# Patient Record
Sex: Female | Born: 1955 | Race: White | Hispanic: No | Smoking: Former smoker
Health system: Southern US, Community
[De-identification: ages and names within clinical notes are randomized; demographics above are authoritative.]

## PROBLEM LIST (undated history)

## (undated) DIAGNOSIS — I219 Acute myocardial infarction, unspecified: Secondary | ICD-10-CM

## (undated) DIAGNOSIS — J449 Chronic obstructive pulmonary disease, unspecified: Secondary | ICD-10-CM

## (undated) DIAGNOSIS — K219 Gastro-esophageal reflux disease without esophagitis: Secondary | ICD-10-CM

## (undated) DIAGNOSIS — I2510 Atherosclerotic heart disease of native coronary artery without angina pectoris: Secondary | ICD-10-CM

## (undated) DIAGNOSIS — R51 Headache: Secondary | ICD-10-CM

## (undated) DIAGNOSIS — I10 Essential (primary) hypertension: Secondary | ICD-10-CM

## (undated) DIAGNOSIS — J189 Pneumonia, unspecified organism: Secondary | ICD-10-CM

## (undated) DIAGNOSIS — E785 Hyperlipidemia, unspecified: Secondary | ICD-10-CM

## (undated) DIAGNOSIS — K759 Inflammatory liver disease, unspecified: Secondary | ICD-10-CM

## (undated) DIAGNOSIS — R519 Headache, unspecified: Secondary | ICD-10-CM

## (undated) HISTORY — DX: Atherosclerotic heart disease of native coronary artery without angina pectoris: I25.10

## (undated) HISTORY — PX: AUGMENTATION MAMMAPLASTY: SUR837

## (undated) HISTORY — DX: Acute myocardial infarction, unspecified: I21.9

## (undated) HISTORY — PX: BREAST BIOPSY: SHX20

## (undated) HISTORY — DX: Essential (primary) hypertension: I10

## (undated) HISTORY — DX: Hyperlipidemia, unspecified: E78.5

---
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
## 1997-09-13 ENCOUNTER — Encounter: Admission: RE | Admit: 1997-09-13 | Discharge: 1997-09-13 | Payer: Self-pay | Admitting: Family Medicine

## 1997-09-20 ENCOUNTER — Encounter: Admission: RE | Admit: 1997-09-20 | Discharge: 1997-09-20 | Payer: Self-pay | Admitting: Family Medicine

## 1998-03-27 ENCOUNTER — Encounter: Admission: RE | Admit: 1998-03-27 | Discharge: 1998-03-27 | Payer: Self-pay | Admitting: Family Medicine

## 1998-03-29 ENCOUNTER — Encounter: Admission: RE | Admit: 1998-03-29 | Discharge: 1998-03-29 | Payer: Self-pay | Admitting: Family Medicine

## 1998-06-11 ENCOUNTER — Encounter: Admission: RE | Admit: 1998-06-11 | Discharge: 1998-06-11 | Payer: Self-pay | Admitting: Family Medicine

## 1998-07-04 ENCOUNTER — Encounter: Admission: RE | Admit: 1998-07-04 | Discharge: 1998-07-04 | Payer: Self-pay | Admitting: Family Medicine

## 1998-09-26 ENCOUNTER — Encounter: Admission: RE | Admit: 1998-09-26 | Discharge: 1998-09-26 | Payer: Self-pay | Admitting: Family Medicine

## 1998-10-03 ENCOUNTER — Encounter: Admission: RE | Admit: 1998-10-03 | Discharge: 1998-10-03 | Payer: Self-pay | Admitting: Family Medicine

## 1998-10-15 ENCOUNTER — Encounter: Admission: RE | Admit: 1998-10-15 | Discharge: 1998-10-15 | Payer: Self-pay | Admitting: Family Medicine

## 1998-11-21 ENCOUNTER — Encounter: Admission: RE | Admit: 1998-11-21 | Discharge: 1998-11-21 | Payer: Self-pay | Admitting: Family Medicine

## 1998-12-27 ENCOUNTER — Encounter: Admission: RE | Admit: 1998-12-27 | Discharge: 1998-12-27 | Payer: Self-pay | Admitting: Family Medicine

## 1999-01-02 ENCOUNTER — Encounter: Admission: RE | Admit: 1999-01-02 | Discharge: 1999-01-02 | Payer: Self-pay | Admitting: Family Medicine

## 1999-10-10 ENCOUNTER — Encounter: Payer: Self-pay | Admitting: Obstetrics and Gynecology

## 1999-10-10 ENCOUNTER — Encounter: Admission: RE | Admit: 1999-10-10 | Discharge: 1999-10-10 | Payer: Self-pay | Admitting: Obstetrics and Gynecology

## 1999-12-23 ENCOUNTER — Encounter: Admission: RE | Admit: 1999-12-23 | Discharge: 1999-12-23 | Payer: Self-pay | Admitting: Family Medicine

## 2001-03-10 ENCOUNTER — Encounter: Admission: RE | Admit: 2001-03-10 | Discharge: 2001-03-10 | Payer: Self-pay | Admitting: Family Medicine

## 2001-06-21 ENCOUNTER — Encounter: Admission: RE | Admit: 2001-06-21 | Discharge: 2001-06-21 | Payer: Self-pay | Admitting: Sports Medicine

## 2001-11-01 ENCOUNTER — Encounter: Admission: RE | Admit: 2001-11-01 | Discharge: 2001-11-01 | Payer: Self-pay | Admitting: Family Medicine

## 2002-02-27 ENCOUNTER — Encounter: Admission: RE | Admit: 2002-02-27 | Discharge: 2002-02-27 | Payer: Self-pay | Admitting: Obstetrics and Gynecology

## 2002-02-27 ENCOUNTER — Encounter: Payer: Self-pay | Admitting: Obstetrics and Gynecology

## 2002-05-11 DIAGNOSIS — I219 Acute myocardial infarction, unspecified: Secondary | ICD-10-CM

## 2002-05-11 HISTORY — DX: Acute myocardial infarction, unspecified: I21.9

## 2002-05-11 HISTORY — PX: CORONARY ANGIOPLASTY WITH STENT PLACEMENT: SHX49

## 2002-07-10 ENCOUNTER — Encounter: Admission: RE | Admit: 2002-07-10 | Discharge: 2002-07-10 | Payer: Self-pay | Admitting: Family Medicine

## 2002-07-10 ENCOUNTER — Ambulatory Visit (HOSPITAL_COMMUNITY): Admission: RE | Admit: 2002-07-10 | Discharge: 2002-07-10 | Payer: Self-pay | Admitting: Family Medicine

## 2002-07-11 ENCOUNTER — Encounter: Admission: RE | Admit: 2002-07-11 | Discharge: 2002-07-11 | Payer: Self-pay | Admitting: Family Medicine

## 2002-07-18 ENCOUNTER — Encounter: Admission: RE | Admit: 2002-07-18 | Discharge: 2002-07-18 | Payer: Self-pay | Admitting: *Deleted

## 2003-01-02 ENCOUNTER — Ambulatory Visit (HOSPITAL_COMMUNITY): Admission: RE | Admit: 2003-01-02 | Discharge: 2003-01-02 | Payer: Self-pay

## 2003-03-09 ENCOUNTER — Encounter: Admission: RE | Admit: 2003-03-09 | Discharge: 2003-03-09 | Payer: Self-pay | Admitting: Obstetrics and Gynecology

## 2004-07-03 ENCOUNTER — Ambulatory Visit: Payer: Self-pay | Admitting: Family Medicine

## 2004-07-06 ENCOUNTER — Encounter: Admission: RE | Admit: 2004-07-06 | Discharge: 2004-07-06 | Payer: Self-pay | Admitting: Family Medicine

## 2004-07-09 ENCOUNTER — Ambulatory Visit (HOSPITAL_COMMUNITY): Admission: RE | Admit: 2004-07-09 | Discharge: 2004-07-09 | Payer: Self-pay | Admitting: Family Medicine

## 2004-07-17 ENCOUNTER — Ambulatory Visit: Payer: Self-pay | Admitting: Family Medicine

## 2005-07-09 ENCOUNTER — Encounter (INDEPENDENT_AMBULATORY_CARE_PROVIDER_SITE_OTHER): Payer: Self-pay | Admitting: *Deleted

## 2005-07-09 LAB — CONVERTED CEMR LAB

## 2005-07-13 ENCOUNTER — Ambulatory Visit (HOSPITAL_COMMUNITY): Admission: RE | Admit: 2005-07-13 | Discharge: 2005-07-13 | Payer: Self-pay | Admitting: Obstetrics and Gynecology

## 2006-02-18 ENCOUNTER — Ambulatory Visit: Payer: Self-pay | Admitting: Family Medicine

## 2006-07-08 DIAGNOSIS — I10 Essential (primary) hypertension: Secondary | ICD-10-CM | POA: Insufficient documentation

## 2006-07-08 DIAGNOSIS — I2510 Atherosclerotic heart disease of native coronary artery without angina pectoris: Secondary | ICD-10-CM | POA: Insufficient documentation

## 2006-07-08 DIAGNOSIS — R519 Headache, unspecified: Secondary | ICD-10-CM | POA: Insufficient documentation

## 2006-07-08 DIAGNOSIS — R51 Headache: Secondary | ICD-10-CM | POA: Insufficient documentation

## 2006-07-09 ENCOUNTER — Encounter (INDEPENDENT_AMBULATORY_CARE_PROVIDER_SITE_OTHER): Payer: Self-pay | Admitting: *Deleted

## 2006-09-20 ENCOUNTER — Ambulatory Visit (HOSPITAL_COMMUNITY): Admission: RE | Admit: 2006-09-20 | Discharge: 2006-09-20 | Payer: Self-pay | Admitting: Obstetrics and Gynecology

## 2006-09-20 ENCOUNTER — Encounter: Payer: Self-pay | Admitting: Family Medicine

## 2006-09-22 ENCOUNTER — Encounter: Payer: Self-pay | Admitting: Family Medicine

## 2006-09-23 ENCOUNTER — Encounter (INDEPENDENT_AMBULATORY_CARE_PROVIDER_SITE_OTHER): Payer: Self-pay | Admitting: Specialist

## 2006-09-23 ENCOUNTER — Encounter: Admission: RE | Admit: 2006-09-23 | Discharge: 2006-09-23 | Payer: Self-pay | Admitting: Obstetrics and Gynecology

## 2006-10-10 LAB — CONVERTED CEMR LAB: Pap Smear: NORMAL

## 2006-10-18 ENCOUNTER — Encounter: Payer: Self-pay | Admitting: Family Medicine

## 2006-11-08 ENCOUNTER — Ambulatory Visit (HOSPITAL_COMMUNITY): Admission: RE | Admit: 2006-11-08 | Discharge: 2006-11-08 | Payer: Self-pay | Admitting: General Surgery

## 2006-11-08 ENCOUNTER — Encounter (INDEPENDENT_AMBULATORY_CARE_PROVIDER_SITE_OTHER): Payer: Self-pay | Admitting: General Surgery

## 2006-11-08 ENCOUNTER — Encounter: Admission: RE | Admit: 2006-11-08 | Discharge: 2006-11-08 | Payer: Self-pay | Admitting: General Surgery

## 2006-12-10 ENCOUNTER — Encounter: Payer: Self-pay | Admitting: Family Medicine

## 2007-02-21 ENCOUNTER — Ambulatory Visit: Payer: Self-pay | Admitting: Family Medicine

## 2007-02-21 DIAGNOSIS — R413 Other amnesia: Secondary | ICD-10-CM | POA: Insufficient documentation

## 2007-02-21 LAB — CONVERTED CEMR LAB
ALT: 20 units/L (ref 0–35)
AST: 18 units/L (ref 0–37)
Albumin: 4.4 g/dL (ref 3.5–5.2)
Alkaline Phosphatase: 62 units/L (ref 39–117)
BUN: 19 mg/dL (ref 6–23)
CO2: 28 meq/L (ref 19–32)
Calcium: 9.5 mg/dL (ref 8.4–10.5)
Chloride: 102 meq/L (ref 96–112)
Creatinine, Ser: 0.7 mg/dL (ref 0.40–1.20)
Glucose, Bld: 90 mg/dL (ref 70–99)
Potassium: 4.7 meq/L (ref 3.5–5.3)
Sodium: 139 meq/L (ref 135–145)
TSH: 2.132 microintl units/mL (ref 0.350–5.50)
Total Bilirubin: 0.6 mg/dL (ref 0.3–1.2)
Total Protein: 7.1 g/dL (ref 6.0–8.3)

## 2007-02-22 ENCOUNTER — Encounter: Payer: Self-pay | Admitting: Family Medicine

## 2007-10-25 ENCOUNTER — Ambulatory Visit (HOSPITAL_COMMUNITY): Admission: RE | Admit: 2007-10-25 | Discharge: 2007-10-25 | Payer: Self-pay | Admitting: Family Medicine

## 2007-11-17 ENCOUNTER — Ambulatory Visit: Payer: Self-pay | Admitting: Family Medicine

## 2007-11-17 DIAGNOSIS — H538 Other visual disturbances: Secondary | ICD-10-CM | POA: Insufficient documentation

## 2008-03-19 ENCOUNTER — Ambulatory Visit (HOSPITAL_COMMUNITY): Admission: RE | Admit: 2008-03-19 | Discharge: 2008-03-19 | Payer: Self-pay | Admitting: Family Medicine

## 2008-03-19 ENCOUNTER — Ambulatory Visit: Payer: Self-pay | Admitting: Family Medicine

## 2008-03-19 ENCOUNTER — Telehealth: Payer: Self-pay | Admitting: *Deleted

## 2008-03-19 DIAGNOSIS — M25569 Pain in unspecified knee: Secondary | ICD-10-CM | POA: Insufficient documentation

## 2008-03-21 ENCOUNTER — Telehealth: Payer: Self-pay | Admitting: *Deleted

## 2008-03-27 ENCOUNTER — Encounter: Payer: Self-pay | Admitting: Family Medicine

## 2008-04-16 ENCOUNTER — Encounter: Admission: RE | Admit: 2008-04-16 | Discharge: 2008-04-16 | Payer: Self-pay | Admitting: Family Medicine

## 2008-04-17 ENCOUNTER — Telehealth: Payer: Self-pay | Admitting: *Deleted

## 2008-04-23 ENCOUNTER — Ambulatory Visit: Payer: Self-pay | Admitting: Family Medicine

## 2008-05-11 HISTORY — PX: KNEE ARTHROSCOPY: SHX127

## 2008-05-16 ENCOUNTER — Encounter: Admission: RE | Admit: 2008-05-16 | Discharge: 2008-05-16 | Payer: Self-pay | Admitting: Family Medicine

## 2008-05-17 ENCOUNTER — Encounter: Payer: Self-pay | Admitting: *Deleted

## 2008-09-14 ENCOUNTER — Telehealth: Payer: Self-pay | Admitting: *Deleted

## 2008-10-18 ENCOUNTER — Ambulatory Visit: Payer: Self-pay | Admitting: Family Medicine

## 2008-10-18 LAB — CONVERTED CEMR LAB
ALT: 31 units/L (ref 0–35)
AST: 26 units/L (ref 0–37)
Albumin: 4.1 g/dL (ref 3.5–5.2)
Alkaline Phosphatase: 72 units/L (ref 39–117)
BUN: 22 mg/dL (ref 6–23)
CO2: 19 meq/L (ref 19–32)
Calcium: 9.5 mg/dL (ref 8.4–10.5)
Chloride: 107 meq/L (ref 96–112)
Cholesterol: 266 mg/dL — ABNORMAL HIGH (ref 0–200)
Creatinine, Ser: 0.68 mg/dL (ref 0.40–1.20)
Glucose, Bld: 93 mg/dL (ref 70–99)
HDL: 63 mg/dL (ref >39)
LDL Cholesterol: 173 mg/dL — ABNORMAL HIGH (ref 0–99)
Potassium: 4.9 meq/L (ref 3.5–5.3)
Sodium: 141 meq/L (ref 135–145)
Total Bilirubin: 0.5 mg/dL (ref 0.3–1.2)
Total CHOL/HDL Ratio: 4.2
Total Protein: 7.4 g/dL (ref 6.0–8.3)
Triglycerides: 149 mg/dL (ref <150)
VLDL: 30 mg/dL (ref 0–40)

## 2008-10-19 ENCOUNTER — Encounter: Payer: Self-pay | Admitting: Family Medicine

## 2008-10-30 ENCOUNTER — Ambulatory Visit (HOSPITAL_COMMUNITY): Admission: RE | Admit: 2008-10-30 | Discharge: 2008-10-30 | Payer: Self-pay | Admitting: Family Medicine

## 2009-07-01 ENCOUNTER — Telehealth: Payer: Self-pay | Admitting: *Deleted

## 2009-09-12 ENCOUNTER — Telehealth: Payer: Self-pay | Admitting: Family Medicine

## 2009-10-23 ENCOUNTER — Ambulatory Visit: Payer: Self-pay | Admitting: Family Medicine

## 2009-10-23 DIAGNOSIS — L0390 Cellulitis, unspecified: Secondary | ICD-10-CM

## 2009-10-23 DIAGNOSIS — L0291 Cutaneous abscess, unspecified: Secondary | ICD-10-CM | POA: Insufficient documentation

## 2010-01-10 ENCOUNTER — Ambulatory Visit (HOSPITAL_COMMUNITY)
Admission: RE | Admit: 2010-01-10 | Discharge: 2010-01-10 | Payer: Self-pay | Source: Home / Self Care | Admitting: Family Medicine

## 2010-02-17 ENCOUNTER — Encounter: Admission: RE | Admit: 2010-02-17 | Discharge: 2010-02-17 | Payer: Self-pay | Admitting: Orthopaedic Surgery

## 2010-02-18 ENCOUNTER — Encounter: Admission: RE | Admit: 2010-02-18 | Discharge: 2010-02-18 | Payer: Self-pay | Admitting: Orthopaedic Surgery

## 2010-02-18 ENCOUNTER — Ambulatory Visit: Payer: Self-pay | Admitting: Cardiology

## 2010-02-19 ENCOUNTER — Telehealth (INDEPENDENT_AMBULATORY_CARE_PROVIDER_SITE_OTHER): Payer: Self-pay | Admitting: *Deleted

## 2010-02-20 ENCOUNTER — Encounter: Payer: Self-pay | Admitting: Cardiology

## 2010-02-20 ENCOUNTER — Encounter (HOSPITAL_COMMUNITY)
Admission: RE | Admit: 2010-02-20 | Discharge: 2010-03-07 | Payer: Self-pay | Source: Home / Self Care | Attending: Cardiology | Admitting: Cardiology

## 2010-02-20 ENCOUNTER — Ambulatory Visit: Payer: Self-pay | Admitting: Cardiology

## 2010-02-20 ENCOUNTER — Ambulatory Visit: Payer: Self-pay

## 2010-04-01 ENCOUNTER — Ambulatory Visit: Payer: Self-pay | Admitting: Cardiology

## 2010-05-09 ENCOUNTER — Ambulatory Visit: Payer: Self-pay | Admitting: Cardiology

## 2010-06-01 ENCOUNTER — Encounter: Payer: Self-pay | Admitting: Family Medicine

## 2010-06-10 NOTE — Assessment & Plan Note (Signed)
Summary: CPE,df   Vital Signs:  Patient profile:   55 year old female Height:      64 inches Weight:      141 pounds BMI:     24.29 BSA:     1.69 Temp:     97.8 degrees F Pulse rate:   62 / minute BP sitting:   132 / 82  Vitals Entered By: Jone Baseman CMA (October 18, 2008 8:44 AM) CC: CPE Pain Assessment Patient in pain? no        CC:  CPE.  History of Present Illness: Knee Pain Has operation.  Is not back to running but can walk on treadmill.  Finished PT  Stress Lots at work.  Is trying to save the furniture business. Is not exercising or taking time for herself.  No sleep or appetite changes Is crying more but does not feel is depressed.    ROS - as above PMH - Medications reviewed and updated in medication list.  Smoking Status noted in VS form     Habits & Providers  Alcohol-Tobacco-Diet     Tobacco Status: quit > 6 months  Current Medications (verified): 1)  Altace 2.5 Mg Caps (Ramipril) .Marland Kitchen.. 1 Once Daily 2)  Bayer Aspirin 325 Mg Tabs (Aspirin) .... Take 1 Tablet By Mouth Once A Day 3)  Metoprolol Tartrate 50 Mg Tabs (Metoprolol Tartrate) .... 1/2 Tab Two Times A Day 4)  Sm Calcium/vitamin D 500-200 Mg-Unit Tabs (Calcium Carbonate-Vitamin D) .... Take 1 Tablet By Mouth Twice A Day 5)  Zetia 10 Mg Tabs (Ezetimibe) .Marland Kitchen.. 1 Daily 6)  Vagifem 25 Mcg  Tabs (Estradiol) .Marland Kitchen.. 1 Twice A Week 7)  Patanol 0.1 %  Soln (Olopatadine Hcl) .Marland Kitchen.. 1 Drop To Affected Eye Two Times A Day.  1 Bottle 8)  Ibu 800 Mg Tabs (Ibuprofen) .... Take 1 Tab By Mouth Every 6 Hours As Neeeded For Pain  Allergies: No Known Drug Allergies  Past History:  Past Medical History: 1. Smoking - stopped 2/00 2. Hx. of MI - s/p stent placement in 2004 (according to pt.)  Left Knee Surgery 2010 Dr Luiz Blare  Social History: Smoking Status:  quit > 6 months  Review of Systems  The patient denies anorexia, fever, weight loss, weight gain, vision loss, decreased hearing, hoarseness, chest  pain, syncope, dyspnea on exertion, peripheral edema, prolonged cough, headaches, hemoptysis, abdominal pain, melena, hematochezia, severe indigestion/heartburn, muscle weakness, suspicious skin lesions, transient blindness, difficulty walking, depression, unusual weight change, enlarged lymph nodes, and angioedema.    Physical Exam  General:  Well-developed,well-nourished,in no acute distress; alert,appropriate and cooperative throughout examination Head:  Normocephalic and atraumatic without obvious abnormalities. No apparent alopecia or balding. Eyes:  Irritation of eyelid margins Nose:  External nasal examination shows no deformity or inflammation. Nasal mucosa are pink and moist without lesions or exudates. Neck:  No deformities, masses, or tenderness noted. Lungs:  Normal respiratory effort, chest expands symmetrically. Lungs are clear to auscultation, no crackles or wheezes. Heart:  Normal rate and regular rhythm. S1 and S2 normal without gallop, murmur, click, rub or other extra sounds. Msk:  No deformity or scoliosis noted of thoracic or lumbar spine.   Extremities:  Can flex L knee to only 90 dgrees  Skin:  Intact without suspicious lesions or rashes Cervical Nodes:  No lymphadenopathy noted Psych:  tearful when talking about stress.  memory intact for recent and remote, normally interactive, and good eye contact.     Impression & Recommendations:  Problem # 1:  KNEE PAIN (ICD-719.46) Encouraged to continue rehab and exercise The following medications were removed from the medication list:    Fioricet/codeine 50-325-40-30 Mg Caps (Butalbital-apap-caff-cod) .Marland Kitchen... Take 1 capsule by mouth    Tramadol Hcl 50 Mg Tabs (Tramadol hcl) .Marland Kitchen... 1-2 by mouth q 6 hours as needed Her updated medication list for this problem includes:    Bayer Aspirin 325 Mg Tabs (Aspirin) .Marland Kitchen... Take 1 tablet by mouth once a day    Ibu 800 Mg Tabs (Ibuprofen) .Marland Kitchen... Take 1 tab by mouth every 6 hours as neeeded  for pain  Problem # 2:  HYPERTENSION, BENIGN SYSTEMIC (ICD-401.1) Well controlled will check labs.  follow up with Dr Laverda Sorenson Her updated medication list for this problem includes:    Altace 2.5 Mg Caps (Ramipril) .Marland Kitchen... 1 once daily    Metoprolol Tartrate 50 Mg Tabs (Metoprolol tartrate) .Marland Kitchen... 1/2 tab two times a day  Orders: Comp Met-[facility] ([id number])  Problem # 3:  CORONARY, ARTERIOSCLEROSIS (ICD-414.00) Check lipids.  Might want to change from zetia to statin  Her updated medication list for this problem includes:    Altace 2.5 Mg Caps (Ramipril) .Marland Kitchen... 1 once daily    Bayer Aspirin 325 Mg Tabs (Aspirin) .Marland Kitchen... Take 1 tablet by mouth once a day    Metoprolol Tartrate 50 Mg Tabs (Metoprolol tartrate) .Marland Kitchen... 1/2 tab two times a day  Orders: Lipid-[facility] ([id number])  Problem # 4:  Preventive Health Care (ICD-V70.0) follow up with Gyn and eye.  Discussed regular exercise as stress reducer and when to contact me if worsening  Complete Medication List: 1)  Altace 2.5 Mg Caps (Ramipril) .Marland Kitchen.. 1 once daily 2)  Bayer Aspirin 325 Mg Tabs (Aspirin) .... Take 1 tablet by mouth once a day 3)  Metoprolol Tartrate 50 Mg Tabs (Metoprolol tartrate) .... 1/2 tab two times a day 4)  Sm Calcium/vitamin D 500-200 Mg-unit Tabs (Calcium carbonate-vitamin d) .... Take 1 tablet by mouth twice a day 5)  Zetia 10 Mg Tabs (Ezetimibe) .Marland Kitchen.. 1 daily 6)  Vagifem 25 Mcg Tabs (Estradiol) .Marland Kitchen.. 1 twice a week 7)  Patanol 0.1 % Soln (Olopatadine hcl) .Marland Kitchen.. 1 drop to affected eye two times a day.  1 bottle 8)  Ibu 800 Mg Tabs (Ibuprofen) .... Take 1 tab by mouth every 6 hours as neeeded for pain  Other Orders: [facility] - Est  40-64 yrs ([address])  Patient Instructions: 1)  Please schedule a follow-up appointment in 1 year.  2)  You need to get back to regular scheduled exercise 3)  follow up with your Gyn doctor and Dr Laverda Sorenson and eye doctor 4)  I will call you if your lab is abnormal otherwise I will send you a letter within  2 weeks. 5)  Call is stress is getting worse if interfering with activities

## 2010-06-10 NOTE — Letter (Signed)
Summary: Generic Letter  [facility]  [address]   [place], [place] [address]   Phone: [phone]  Fax: [phone]    10/19/2008  [facility] [place] [address] [place], [place]  [address]  Dear [name],  Your blood tests are good except for your choleserol which is much too high.  I would discuss this with Dr Laverda Sorenson very soon or let me know and I can start you on a cholesterol medicine.    Please let me know if the stress is getting worse.   Take Care  Patient: Jamie Pennington Note: All result statuses are Final unless otherwise noted.  Tests: (1) Comprehensive Metabolic Panel ([address])   Order Note: FASTING   Sodium                    141 mEq/L                   135-145   Potassium                 4.9 mEq/L                   3.5-5.3   Chloride                  107 mEq/L                   96-112   CO2                       19 mEq/L                    19-32   Glucose                   93 mg/dL                    53-66   BUN                       22 mg/dL                    4-40   Creatinine                0.68 mg/dL                  0.40-1.20   Bilirubin, Total          0.5 mg/dL                   3.4-7.4   Alkaline Phosphatase      72 U/L                      39-117   AST/SGOT                  26 U/L                      0-37   ALT/SGPT                  31 U/L                      0-35   Total Protein             7.4 g/dL  6.0-8.3   Albumin                   4.1 g/dL                    7.8-2.9   Calcium                   9.5 mg/dL                   5.6-21.3  Tests: (2) Lipid Profile ([address]657)   Cholesterol          [H]  266 mg/dL                   8-469     ATP III Classification:           < 200        mg/dL        Desirable          200 - 239     mg/dL        Borderline High          >= 240        mg/dL        High         Triglyceride              149 mg/dL                   <629   HDL Cholesterol           63 mg/dL                     >52   Total Chol/HDL Ratio      4.2 Ratio  VLDL Cholesterol (Calc)                             30 mg/dL                    8-41  LDL Cholesterol (Calc)                        [H]  173 mg/dL                   3-24       Sincerely, Pearlean Brownie MD  Appended Document: Generic Letter mailed

## 2010-06-10 NOTE — Consult Note (Signed)
Summary: [facility]   Imported By: Denny Peon LEVAN 10/18/2006 11:02:19  _____________________________________________________________________  External Attachment:    Type:   Image     Comment:   External Document

## 2010-06-10 NOTE — Progress Notes (Signed)
Summary: Rx isn't helping for pain  Phone Note Call from Patient Call back at [phone]   Summary of Call: ibuprofen isn't helping with pain and pt states md told her that we would call something stronger in if that was the case, pt [facility]  [facility] [address] Initial call taken by: Haydee Salter,  March 21, 2008 11:26 AM  Follow-up for Phone Call        patient states knee pain is continuing despite ibuprofen.  MD  has told her he would call in something else if not helping with conseravtive measures. will send message to Dr. Deirdre Priest to please advise , or to ask if he would prefer this message go to Dr. Lelon Perla. Follow-up by: Theresia Lo RN,  March 21, 2008 11:40 AM  Additional Follow-up for Phone Call Additional follow up Details #1::        I sent Ultram to her pharmacy - see med list She should get an xray since still painful I orderred that she can pick up or we can call it to imaging dept Pls notify her of the above thanks [facility] Additional Follow-up by: Pearlean Brownie MD,  March 21, 2008 12:00 PM    Additional Follow-up for Phone Call Additional follow up Details #2::    Patient informed of above, faxed order to [facility] on [address] Follow-up by: Aubrey Voong LPN,  March 21, 2008 12:10 PM  New/Updated Medications: TRAMADOL HCL 50 MG  TABS (TRAMADOL HCL) 1-2 by mouth q 6 hours as needed   Prescriptions: TRAMADOL HCL 50 MG  TABS (TRAMADOL HCL) 1-2 by mouth q 6 hours as needed  #30 x 1   Entered and Authorized by:   Pearlean Brownie MD   Signed by:   Pearlean Brownie MD on 03/21/2008   Method used:   Electronically to        [facility] [phone]* (retail)       [address]       [place], [place]  [address]       Ph: [phone]       Fax: [phone]   RxID:   [id number]

## 2010-06-10 NOTE — Progress Notes (Signed)
Summary: triage  Phone Note Call from Patient Call back at [phone]   Caller: Patient Summary of Call: painful knee x 2 days- would like to be seen- having a hard time walking on it. Initial call taken by: De Nurse,  March 19, 2008 10:12 AM  Follow-up for Phone Call        symptoms started 2 days ago.Marland Kitchen appointment scheduled today. Follow-up by: Theresia Lo RN,  March 19, 2008 10:34 AM

## 2010-06-10 NOTE — Progress Notes (Signed)
Summary: phn msg  Phone Note Call from Patient Call back at [phone]   Caller: Patient Summary of Call: would like results of yesterday's knee xray Initial call taken by: De Nurse,  April 17, 2008 4:03 PM  Follow-up for Phone Call        called pt. informed of x-ray WNL and advised to sched. f/up OV with Dr.Chambliss. (pt still c/o knee pain). pt agreed. Follow-up by: Arlyss Repress CMA,,  April 17, 2008 4:41 PM

## 2010-06-10 NOTE — Consult Note (Signed)
[facility]   Imported By: Haydee Salter 10/06/2006 15:39:51  _____________________________________________________________________  External Attachment:    Type:   Image     Comment:   External Document  Appended Document: [facility]    Clinical Lists Changes  Observations: Added new observation of COLONNXTDUE: 10/2011 (10/07/2006 15:51) Added new observation of COLONOSCOPY: abnormal  see report from [facility] (10/07/2006 15:51)      Preventive Care Screening  Colonoscopy:    Date:  10/07/2006    Next Due:  10/2011    Results:  abnormal  see report from [facility]

## 2010-06-10 NOTE — Consult Note (Signed)
[facility]   Imported By: Haydee Salter 09/28/2006 14:14:45  _____________________________________________________________________  External Attachment:    Type:   Image     Comment:   External Document

## 2010-06-10 NOTE — Letter (Signed)
Summary: Generic Letter  Jamie Pennington [facility]  [address]   [place], [place] [address]   Phone: [phone]  Fax: [phone]    02/22/2007  [facility] [place] [address] [place], [place]  [address]  Dear [name],   Your blood tests were all normal.  If the memory does not get better or if it seems to worsen.  Call or make an appointment.  Sincerely,  Doneta Public MD [facility]  Appended Document: Generic Letter mailed letter to pt

## 2010-06-10 NOTE — Assessment & Plan Note (Signed)
Summary: knee pain started 2 days ago, difficulty walking/ls   Vital Signs:  Patient Profile:   55 Years Old Female Height:     64 inches Weight:      131.4 pounds Temp:     98.2 degrees F Pulse rate:   62 / minute BP sitting:   138 / 85  (right arm)  Pt. in pain?   yes    Location:   left knee    Intensity:   7    Type:       aching  Vitals Entered ByJacki Cones RN (March 19, 2008 11:45 AM)                  Chief Complaint:  left knee pain and intermittent chest discomfort since saturday.  History of Present Illness: CC: knee pain and chest pain  1. knee pain:  Mainly in left knee, but right knee has also been bothering her.  She noticed it Friday.  She states that this came on after she had bad sinus congestion.  She also had some left neck and chest pain.  She has not had pain like this before in her knees.  She has had knee pain in the past from working out but this does not feel like that.  The pain is described as a throbbing pain, that is worse when laying down or when she goes up or down stairs.  Rest makes it better.  She has tried Tylenol with no improvement.  The pain is rated 7/10 with movement.  She has noticed popping and clicking when she extends the knee or a lot of movement.  She also feels that it locks, catches.  She endorses some swelling, no warmth.  Knee has full ROM.  No prior injuries to the knee.  2. chest pain: Per pt hx. of heart attach in 2004 s/p stent placement. Has been intermittent since Saturday.  Left sided.  Non-radiating.  Does not feel like prior heart attack.  She has had 4 episodes since Saturday, that last 2 minutes, and resolve on their own.  Did not require NTG.  Denies SOB, diaphoresis, headache, orthopnea.    Prior Medication List:  ALTACE 2.5 MG CAPS (RAMIPRIL) 1 once daily BAYER ASPIRIN 325 MG TABS (ASPIRIN) Take 1 tablet by mouth once a day FIORICET/CODEINE 50-325-40-30 MG CAPS (BUTALBITAL-APAP-CAFF-COD) Take 1 capsule by  mouth LIPITOR 20 MG TABS (ATORVASTATIN CALCIUM) 1 once daily METOPROLOL TARTRATE 50 MG TABS (METOPROLOL TARTRATE) 1/2 tab two times a day SM CALCIUM/VITAMIN D 500-200 MG-UNIT TABS (CALCIUM CARBONATE-VITAMIN D) Take 1 tablet by mouth twice a day ZETIA 10 MG TABS (EZETIMIBE) 1 daily VAGIFEM 25 MCG  TABS (ESTRADIOL) 1 twice a week PATANOL 0.1 %  SOLN (OLOPATADINE HCL) 1 drop to affected eye two times a day.  1 bottle   Current Allergies: No known allergies   Past Medical History:    1. Smoking - stopped 2/00    2. Hx. 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 alert and well-hydrated.   Chest Wall:     Mildly TTP in left anterior chest wall Lungs:     Normal respiratory effort, chest expands symmetrically. Lungs are clear to auscultation, no crackles or wheezes. Heart:     Normal rate and regular rhythm. S1 and S2 normal without gallop, murmur, click, rub or other extra sounds. Abdomen:     S/NT   Knee Exam  Gait:    limp noted-left.    Skin:    healthy-R (scar from prior fall, no surgery) and healthy-L.    Inspection:    L knee 1+ swelling  Palpation:    non-tender  Vascular:    intact  Knee Exam:    Right:    Inspection:  Normal    Palpation:  Normal    Stability:  stable    Tenderness:  no    Swelling:  no    Erythema:  no    Range of Motion:       Flexion-Active: full       Extension-Active: full       Flexion-Passive: full        Extension-Passive: full    Left:    Inspection:  Abnormal    Palpation:  Normal    Stability:  stable    Tenderness:  no    Swelling:  medial collateral    Erythema:  no    Pain with full extension.  faint clicking/clunking over medial knee joint with passive movement.    Range of Motion:       Flexion-Active: 110 degrees       Extension-Active: full       Flexion-Passive: full       Extension-Passive: full    Impression & Recommendations:  Problem # 1:  KNEE PAIN (VHQ-469.62) Assessment: New Seems consistent with OA vs. Medial meniscus injury.  Will treat with anti-inflammatory and conservative measures for now.  If pain is not improved recommend knee radiographs and then possible MRI. Her updated medication list for this problem includes:    Bayer Aspirin 325 Mg Tabs (Aspirin) .Marland Kitchen... Take 1 tablet by mouth once a day    Fioricet/codeine 50-325-40-30 Mg Caps (Butalbital-apap-caff-cod) .Marland Kitchen... Take 1 capsule by mouth    Ibu 800 Mg Tabs (Ibuprofen) .Marland Kitchen... Take 1 tab by mouth every 6 hours as neeeded for pain  Orders: [facility]- Est  Level 4 ([address])   Problem # 2:  CHEST PAIN (ICD-786.50) Assessment: New Pt. has not had chest pain like this before and it does not feel like her previous MI according to pt.  EKG was normal.  Told pt. to monitor her symptoms closely and if it returns, gets any worse, or does not go away to either return to clinic or go straight to the ED. Orders: EKG- [facility] (EKG) [facility]- Est  Level 4 ([address])   Complete Medication List: 1)  Altace 2.5 Mg Caps (Ramipril) .Marland Kitchen.. 1 once daily 2)  Bayer Aspirin 325 Mg Tabs (Aspirin) .... Take 1 tablet by mouth once a day 3)  Fioricet/codeine 50-325-40-30 Mg Caps (Butalbital-apap-caff-cod) .... Take 1 capsule by mouth 4)  Lipitor 20 Mg Tabs (Atorvastatin calcium) .Marland Kitchen.. 1 once daily 5)  Metoprolol Tartrate 50 Mg Tabs (Metoprolol  tartrate) .... 1/2 tab two times a day 6)  Sm Calcium/vitamin D 500-200 Mg-unit Tabs (Calcium  carbonate-vitamin d) .... Take 1 tablet by mouth twice a day 7)  Zetia 10 Mg Tabs (Ezetimibe) .Marland Kitchen.. 1 daily 8)  Vagifem 25 Mcg Tabs (Estradiol) .Marland Kitchen.. 1 twice a week 9)  Patanol 0.1 % Soln (Olopatadine hcl) .Marland Kitchen.. 1 drop to affected eye two times a day.  1 bottle 10)  Ibu 800 Mg Tabs (Ibuprofen) .... Take 1 tab by mouth every 6 hours as neeeded for pain   Patient Instructions: 1)  Please schedule a follow-up appointment in 2 weeks if knee pain is not improved. 2)  You may return to normal activities as pain allows. 3)  If chest pain continues please seek medical attention, either return to clinic or go to the ED.   Prescriptions: IBU 800 MG TABS (IBUPROFEN) take 1 tab by mouth every 6 hours as neeeded for pain  #40 x 1   Entered and Authorized by:   Angelena Sole MD   Signed by:   Angelena Sole MD on 03/19/2008   Method used:   Electronically to        [facility]. [phone]* (retail)       [address]       [place], [place]  [address]       Ph: [phone]       Fax: [phone]   RxID:   [id number]  ]

## 2010-06-10 NOTE — Miscellaneous (Signed)
Summary: Ortho Referral  Clinical Lists Changes  Orders: Added new Referral order of Orthopedic Surgeon Referral (Ortho Surgeon) - Signed

## 2010-06-10 NOTE — Assessment & Plan Note (Signed)
Summary: eye problems wp   Vital Signs:  Patient Profile:   55 Years Old Female Height:     64 inches Weight:      134 pounds Temp:     97.9 degrees F Pulse rate:   69 / minute BP sitting:   121 / 77  Pt. in pain?   no  Vitals Entered By: Jone Baseman CMA (November 17, 2007 10:03 AM)               Vision Screening: Left eye w/o correction: 20 / 25 Right Eye w/o correction: 20 / 30 Both eyes w/o correction:  20/ 20        Vision Entered By: Jone Baseman CMA (November 17, 2007 10:48 AM)  Flex Sig Next Due:  Not Indicated Hemoccult Next Due:  Not Indicated   Chief Complaint:  eye problems x 3 days.  History of Present Illness: Left Eye Irritation for the last few days.  Feels itchy and is watery and gooey in the AM.  No pain or decreased vision.  No sick contacts.  Has not spread to other eye.  Has similar symptoms every few weeks to months.  Takes clariten regularly but no eye drops or contacts.   Can't recall any exposures to new materials or products  CAD sees her cardiologist regularly who follows cholesterol and her medications   ROS - as above PMH - Medications reviewed and updated in medication list.  Smoking Status noted in VS form      Current Allergies: No known allergies       Physical Exam  General:     Well-developed,well-nourished,in no acute distress; alert,appropriate and cooperative throughout examination Eyes:     Left eye periheral conjunctival injection sparing the iris.  PERRL EOMI.  No discharge No lymph nodes  R eye normal     Impression & Recommendations:  Problem # 1:  CONJUNCTIVITIS (ICD-372.30) Assessment: New Likely allergic given repetitive nature and lack of spread.  will treat with ocular antihistamine and follow. Orders: [facility]- Est Level  3 ([address])   Complete Medication List: 1)  Altace 2.5 Mg Caps (Ramipril) .Marland Kitchen.. 1 once daily 2)  Bayer Aspirin 325 Mg Tabs (Aspirin) .... Take 1 tablet by mouth once a day 3)   Fioricet/codeine 50-325-40-30 Mg Caps (Butalbital-apap-caff-cod) .... Take 1 capsule by mouth 4)  Lipitor 20 Mg Tabs (Atorvastatin calcium) .Marland Kitchen.. 1 once daily 5)  Metoprolol Tartrate 50 Mg Tabs (Metoprolol tartrate) .... 1/2 tab two times a day 6)  Sm Calcium/vitamin D 500-200 Mg-unit Tabs (Calcium carbonate-vitamin d) .... Take 1 tablet by mouth twice a day 7)  Zetia 10 Mg Tabs (Ezetimibe) .Marland Kitchen.. 1 daily 8)  Vagifem 25 Mcg Tabs (Estradiol) .Marland Kitchen.. 1 twice a week 9)  Patanol 0.1 % Soln (Olopatadine hcl) .Marland Kitchen.. 1 drop to affected eye two times a day.  1 bottle  [id number][facility]   Patient Instructions: 1)  Please make an appointment with your eye doctor to be checked for vision and glaucoma 2)  If your vision worses or if you develop pain in your eye call us immediately 3)  Review what you might be coming in contact with that would irritate your eye   Prescriptions: PATANOL 0.1 %  SOLN (OLOPATADINE HCL) 1 drop to affected eye two times a day.  1 bottle  #1 x 1   Entered and Authorized by:   Pearlean Brownie MD   Signed by:   Gaynell Face  Marek Nghiem MD on 11/17/2007   Method used:   Electronically sent to ...       [facility] [address] [phone]*       [address]       [place], [place]  [address]       Ph: [phone]       Fax: [phone]   RxID:   [id number]  ]

## 2010-06-10 NOTE — Assessment & Plan Note (Signed)
Summary: extreme knee pain wp   Vital Signs:  Patient Profile:   55 Years Old Female Height:     64 inches Weight:      136 pounds Temp:     98.7 degrees F Pulse rate:   85 / minute BP sitting:   160 / 83  Pt. in pain?   yes    Location:   left knee    Intensity:   4  Vitals Entered By: Jone Baseman CMA (April 23, 2008 4:31 PM)                   Chief Complaint:  cont knee pain.  History of Present Illness: Left Knee Pain has persisted since last visit.  Does not hurt at rest but with bending.  No soft tissue swelling or redness.  Has seemed to lock several times over the last few weeks.  Is not able to do steps or exercise which really bothers her.  No weakness   ROS - as above PMH - Medications reviewed and updated in medication list.  Smoking Status noted in VS form      Current Allergies: No known allergies       Physical Exam  General:     Well-developed,well-nourished,in no acute distress; alert,appropriate and cooperative throughout examination Msk:     Left Knee - full extension lacks full flexion,  not soft tissue swelling or effusion.  Negative lachman no laxity + McMurrays with pain and click feeling.  Distal strength tenderness normal     Impression & Recommendations:  Problem # 1:  KNEE PAIN (ICD-719.46) suspect meniscus tear.  She is very active and would consider surgery.  Will get MRI.  She does not want anything further for pain Her updated medication list for this problem includes:    Bayer Aspirin 325 Mg Tabs (Aspirin) .Marland Kitchen... Take 1 tablet by mouth once a day    Fioricet/codeine 50-325-40-30 Mg Caps (Butalbital-apap-caff-cod) .Marland Kitchen... Take 1 capsule by mouth    Ibu 800 Mg Tabs (Ibuprofen) .Marland Kitchen... Take 1 tab by mouth every 6 hours as neeeded for pain    Tramadol Hcl 50 Mg Tabs (Tramadol hcl) .Marland Kitchen... 1-2 by mouth q 6 hours as needed  Orders: MRI (MRI) [facility]- Est Level  3 (16109)   Complete Medication List: 1)  Altace 2.5 Mg Caps  (Ramipril) .Marland Kitchen.. 1 once daily 2)  Bayer Aspirin 325 Mg Tabs (Aspirin) .... Take 1 tablet by mouth once a day 3)  Fioricet/codeine 50-325-40-30 Mg Caps (Butalbital-apap-caff-cod) .... Take 1 capsule by mouth 4)  Lipitor 20 Mg Tabs (Atorvastatin calcium) .Marland Kitchen.. 1 once daily 5)  Metoprolol Tartrate 50 Mg Tabs (Metoprolol tartrate) .... 1/2 tab two times a day 6)  Sm Calcium/vitamin D 500-200 Mg-unit Tabs (Calcium carbonate-vitamin d) .... Take 1 tablet by mouth twice a day 7)  Zetia 10 Mg Tabs (Ezetimibe) .Marland Kitchen.. 1 daily 8)  Vagifem 25 Mcg Tabs (Estradiol) .Marland Kitchen.. 1 twice a week 9)  Patanol 0.1 % Soln (Olopatadine hcl) .Marland Kitchen.. 1 drop to affected eye two times a day.  1 bottle 10)  Ibu 800 Mg Tabs (Ibuprofen) .... Take 1 tab by mouth every 6 hours as neeeded for pain 11)  Tramadol Hcl 50 Mg Tabs (Tramadol hcl) .Marland Kitchen.. 1-2 by mouth q 6 hours as needed   Patient Instructions: 1)  I will call you with the results of the MRI. 2)  Do straight leg execises 3)  Call if you get a lot swelling or  redness   ]

## 2010-06-10 NOTE — Consult Note (Signed)
Summary: [facility]   Imported By: Denny Peon LEVAN 12/10/2006 11:43:20  _____________________________________________________________________  External Attachment:    Type:   Image     Comment:   External Document

## 2010-06-10 NOTE — Progress Notes (Signed)
Summary: Triage  Phone Note Call from Patient Call back at [phone]   Caller: Daughter-Mandy Summary of Call: has a film over her right and the eye is red should she be seen. Initial call taken by: Clydell Hakim,  Sep 14, 2008 1:40 PM  Follow-up for Phone Call        Patient was treated for this once before.  Spoke with Dr. Deirdre Priest who will call in Patanol eye gtts.  Patient instructed that it should be rady in about an hour. Follow-up by: Dennison Nancy RN,  Sep 14, 2008 2:13 PM

## 2010-06-10 NOTE — Assessment & Plan Note (Signed)
Summary: cpe/no pap/el   Vital Signs:  Patient Profile:   55 Years Old Female Height:     64 inches Weight:      128.9 pounds Temp:     97.8 degrees F Pulse rate:   67 / minute BP sitting:   147 / 76  (left arm)  Pt. in pain?   no  Vitals Entered By: Jacki Cones RN (February 21, 2007 1:54 PM)                  Chief Complaint:  physical - no pap.  History of Present Illness: Memory Loss Has noticed for at least last 6months.  Will forget where put keys or how to make coffee or have to think where she isgoing in the car.  Not forgetting directions or names or procedures other than coffee.  No neurologic symptoms of weakness or visual changes or balance or writing problems.  No weight loss or fevers.  No depressive symptoms.  Not under any increased stress.    Mother had alzhiemers  CAD Sees Dr Remonia Richter doing well.  Exercising and not chest pain  GYN Sees Dr Elana Alm had recent pap and mammogram with benign biopsy  Medications reviewed and updated in medication list   Current Allergies: No known allergies    Social History:    Owns Engineer, maintenance (IT).  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 No apparent alopecia or balding. Eyes:     No corneal or conjunctival inflammation noted. EOMI. Perrla. Funduscopic exam benign, without hemorrhages, exudates or papilledema. Vision grossly normal. Mouth:     Oral mucosa and oropharynx without lesions or exudates.  Teeth in good repair. Neck:     No deformities, masses, or tenderness  noted. Lungs:     Normal respiratory effort, chest expands symmetrically. Lungs are clear to auscultation, no crackles or wheezes. Heart:     Normal rate and regular rhythm. S1 and S2 normal without gallop, murmur, click, rub or other extra sounds. Abdomen:     Bowel sounds positive,abdomen soft and non-tender without masses, organomegaly or hernias noted. Msk:     No deformity or scoliosis noted of thoracic or lumbar spine.   Extremities:     No clubbing, cyanosis, edema, or deformity noted with normal full range of motion of all joints.   Neurologic:     No cranial nerve deficits noted. Station and gait are normal. Plantar reflexes are down-going bilaterally. DTRs are symmetrical throughout. Sensory, motor and coordinative functions appear intact.  Normal Romberg  Skin:     Intact without suspicious lesions or rashes Cervical Nodes:     No lymphadenopathy noted Psych:     Cognition and judgment appear intact. Alert and cooperative with normal attention span and concentration. No apparent delusions, illusions, hallucinations    Impression & Recommendations:  Problem # 1:  SYMPTOM, MEMORY LOSS (ICD-780.93) No evident cause.  Non focal neurological exam.  check labs and follow.  Discussed possible causes and indications  for neurological imaging. Orders: Comp Met-[facility] [phone]) TSH-[facility] [phone]) Sed Rate (ESR)-[facility] [phone]) [facility]- Est  Level 4 (53664)   Problem # 2:  HYPERTENSION, BENIGN SYSTEMIC (ICD-401.1) Assessment: Improved  Her updated medication list for this problem includes:    Altace 2.5 Mg Caps (Ramipril) .Marland Kitchen... 1 once daily    Metoprolol Tartrate 50 Mg Tabs (Metoprolol tartrate) .Marland Kitchen... 1/2 tab two times a day   Problem # 3:  CORONARY, ARTERIOSCLEROSIS (ICD-414.00) Assessment: Improved  The following medications were removed from the medication list:    Plavix 75 Mg Tabs (Clopidogrel bisulfate)  Her updated medication list for this problem includes:    Altace  2.5 Mg Caps (Ramipril) .Marland Kitchen... 1 once daily    Bayer Aspirin 325 Mg Tabs (Aspirin) .Marland Kitchen... Take 1 tablet by mouth once a day    Metoprolol Tartrate 50 Mg Tabs (Metoprolol tartrate) .Marland Kitchen... 1/2 tab two times a day   Complete Medication List: 1)  Altace 2.5 Mg Caps (Ramipril) .Marland Kitchen.. 1 once daily 2)  Bayer Aspirin 325 Mg Tabs (Aspirin) .... Take 1 tablet by mouth once a day 3)  Fioricet/codeine 50-325-40-30 Mg Caps (Butalbital-apap-caff-cod) .... Take 1 capsule by mouth 4)  Lipitor 20 Mg Tabs (Atorvastatin calcium) .Marland Kitchen.. 1 once daily 5)  Metoprolol Tartrate 50 Mg Tabs (Metoprolol tartrate) .... 1/2 tab two times a day 6)  Sm Calcium/vitamin D 500-200 Mg-unit Tabs (Calcium carbonate-vitamin d) .... Take 1 tablet by mouth twice a day 7)  Zetia 10 Mg Tabs (Ezetimibe) .Marland Kitchen.. 1 daily 8)  Vagifem 25 Mcg Tabs (Estradiol) .Marland Kitchen.. 1 twice a week  Other Orders: Flu Vaccine 33yrs + (40347) Admin 1st Vaccine (42595)   Patient Instructions: 1)  I will call you if your lab is abnormal otherwise I will send you a letter within 2 weeks.  2)  Call me in 1-2 months if things arenot improving or sooner if worsening    ]  Influenza Vaccine    Vaccine Type: Fluvax 3+    Site: right deltoid    Mfr: [employer]    Dose: 0.5 ml    Route: IM    Given by: AMY MARTIN RN    Exp. Date: 11/08/2007    Lot #: [id number]    VIS given: 11/07/04 version given February 21, 2007.  Flu Vaccine Consent Questions    Do you have a history of severe allergic reactions to this vaccine? no    Any prior history of allergic reactions to egg and/or gelatin? no    Do you have a sensitivity to the preservative Thimersol? no    Do you have a past history of Guillan-Barre Syndrome? no    Do you currently have an acute febrile illness? no    Have you ever had a severe reaction to latex? no    Vaccine information given and explained to patient? yes    Are you currently pregnant? no    Preventive Care Screening  Last Flu Shot:     Date:  02/21/2007    Results:  Fluvax 3+  Mammogram:    Date:  10/10/2006    Results:  abnormal   Pap Smear:    Date:  10/10/2006    Results:  normal   Appended Document: sedrate report    Lab Visit   Laboratory Results   Blood Tests   Date/Time Received: February 21, 2007 3.12  PM  Date/Time Reported: February 21, 2007 4:55 PM   SED rate: 1 mm/hr  Comments: ...............test performed by......Marland KitchenKendal Hymen  A. [place], [place] (ASCP)     Orders Today:

## 2010-06-10 NOTE — Progress Notes (Signed)
Summary: triage  Phone Note Call from Patient Call back at [phone]   Caller: Daughter-Mandy Summary of Call: Mom has flu and would like to get her seen today. Initial call taken by: Clydell Hakim,  July 01, 2009 10:17 AM  Follow-up for Phone Call        left message Follow-up by: Golden Circle RN,  July 01, 2009 10:27 AM  Additional Follow-up for Phone Call Additional follow up Details #1::        left message Additional Follow-up by: Golden Circle RN,  July 01, 2009 2:05 PM    Additional Follow-up for Phone Call Additional follow up Details #2::    left message that if we are needed, call back Follow-up by: Golden Circle RN,  July 02, 2009 11:04 AM  Additional Follow-up for Phone Call Additional follow up Details #3:: Details for Additional Follow-up Action Taken: left message. will wait for pt to call back Additional Follow-up by: Golden Circle RN,  July 03, 2009 9:03 AM

## 2010-06-10 NOTE — Assessment & Plan Note (Signed)
Summary: abcess   Vital Signs:  Patient profile:   55 year old female Height:      64 inches Weight:      156.9 pounds BMI:     27.03 Temp:     98.1 degrees F oral Pulse rate:   81 / minute BP sitting:   148 / 85  (left arm) Cuff size:   regular  Vitals Entered By: Gladstone Pih (October 23, 2009 9:32 AM) CC: C/O rash under right arm X 1 week Is Patient Diabetic? No Pain Assessment Patient in pain? no        CC:  C/O rash under right arm X 1 week.  History of Present Illness: 47 yop with 1 week of painful cyts under arm.  They have ruptured and drain pus.  No fevers or chills.  Was sick about 2 weeks ago with stomach virus.  Was rx'ed with IV for dehydration at [facility] at that time.  Not using warm compresses on them.  No history of same, but did have breast abscess that was I and D'ed many years ago.  Habits & Providers  Alcohol-Tobacco-Diet     Tobacco Status: quit     Tobacco Counseling: to quit use of tobacco products     Year Quit: 2000  Current Medications (verified): 1)  Altace 2.5 Mg Caps (Ramipril) .Marland Kitchen.. 1 Once Daily 2)  Bayer Aspirin 325 Mg Tabs (Aspirin) .... Take 1 Tablet By Mouth Once A Day 3)  Metoprolol Tartrate 50 Mg Tabs (Metoprolol Tartrate) .... 1/2 Tab Two Times A Day 4)  Sm Calcium/vitamin D 500-200 Mg-Unit Tabs (Calcium Carbonate-Vitamin D) .... Take 1 Tablet By Mouth Twice A Day 5)  Zetia 10 Mg Tabs (Ezetimibe) .Marland Kitchen.. 1 Daily 6)  Vagifem 25 Mcg  Tabs (Estradiol) .Marland Kitchen.. 1 Twice A Week 7)  Patanol 0.1 %  Soln (Olopatadine Hcl) .Marland Kitchen.. 1 Drop To Affected Eye Two Times A Day.  1 Bottle 8)  Ibu 800 Mg Tabs (Ibuprofen) .... Take 1 Tab By Mouth Every 6 Hours As Neeeded For Pain  Allergies (verified): No Known Drug Allergies  Past History:  Past medical history reviewed for relevance to current acute and chronic problems. Past surgical history reviewed for relevance to current acute and chronic problems.  Past Medical History: Reviewed history from  10/18/2008 and no changes required. 1. Smoking - stopped 2/00 2. Hx. of MI - s/p stent placement in 2004 (according to pt.)  Left Knee Surgery 2010 Dr Luiz Blare  Past Surgical History: Reviewed history from 07/08/2006 and no changes required. cath with balloon 04 - 07/03/2004, MRI brain - 2/06 - 07/17/2004  Social History: Smoking Status:  quit  Review of Systems       see HPI  Physical Exam  General:  Well-developed,well-nourished,in no acute distress; alert,appropriate and cooperative throughout examination Skin:  Multiple abcesses R axilla, largest 2 cm X 1 cm, most 0.5 cm or less  + erythema and TTP.  Some (including largest) already draining purulent material. Some fluctuance of the largest abcess, otherwise no significant fluctuance.     Impression & Recommendations:  Problem # 1:  ABSCESS, SKIN (ICD-682.9) Multiple small abcesses that would be difficult to I and D.  Largest one already draining.  Will try warm compresses, course of SMX/TMP.  Reviewed return precautions. Her updated medication list for this problem includes:    Septra Ds 800-160 Mg Tabs (Sulfamethoxazole-trimethoprim) .Marland Kitchen... 2 by mouth two times a day for 10 days  Complete Medication  List: 1)  Altace 2.5 Mg Caps (Ramipril) .Marland Kitchen.. 1 once daily 2)  Bayer Aspirin 325 Mg Tabs (Aspirin) .... Take 1 tablet by mouth once a day 3)  Metoprolol Tartrate 50 Mg Tabs (Metoprolol tartrate) .... 1/2 tab two times a day 4)  Sm Calcium/vitamin D 500-200 Mg-unit Tabs (Calcium carbonate-vitamin d) .... Take 1 tablet by mouth twice a day 5)  Zetia 10 Mg Tabs (Ezetimibe) .Marland Kitchen.. 1 daily 6)  Vagifem 25 Mcg Tabs (Estradiol) .Marland Kitchen.. 1 twice a week 7)  Patanol 0.1 % Soln (Olopatadine hcl) .Marland Kitchen.. 1 drop to affected eye two times a day.  1 bottle 8)  Ibu 800 Mg Tabs (Ibuprofen) .... Take 1 tab by mouth every 6 hours as neeeded for pain 9)  Septra Ds 800-160 Mg Tabs (Sulfamethoxazole-trimethoprim) .... 2 by mouth two times a day for 10  days  Patient Instructions: 1)  Try the warm compresses to your armpit three times a day. 2)  If you develop fevers or chills, or if the spots aren't getting better, or if you feel worse, please come see [place] again. We may need to drain these. Prescriptions: SEPTRA DS 800-160 MG TABS (SULFAMETHOXAZOLE-TRIMETHOPRIM) 2 by mouth two times a day for 10 days  #40 x 0   Entered and Authorized by:   Sarah [place] MD   Signed by:   Sarah [place] MD on 10/23/2009   Method used:   Historical   RxID:   [id number] SEPTRA DS 800-160 MG TABS (SULFAMETHOXAZOLE-TRIMETHOPRIM) 2 by mouth two times a day for 10 days  #20 x 0   Entered and Authorized by:   Sarah [place] MD   Signed by:   Sarah [place] MD on 10/23/2009   Method used:   Electronically to        [facility]. [phone]* (retail)       [address]       [place], [place]  [address]       Ph: [phone]       Fax: [phone]   RxID:   [id number]   Appended Document: abcess    Clinical Lists Changes  Orders: Added new Test order of [facility]- Est Level  3 (47425) - Signed

## 2010-06-10 NOTE — Assessment & Plan Note (Signed)
Summary: Cardiology Nuclear Testing  Nuclear Med Background Indications for Stress Test: Evaluation for Ischemia, Surgical Clearance, Stent Patency, PTCA Patency  Indications Comments: Pending Teeth extractions, bilateral knee surgeries.  History: Angioplasty, Heart Catheterization, Myocardial Infarction, Myocardial Perfusion Study, Stents  History Comments: 04 IWMI>Stent RCA, residual 50-60 CFX. '08 MPS: NL.     Nuclear Pre-Procedure Cardiac Risk Factors: Family History - CAD, History of Smoking, Hypertension Caffeine/Decaff Intake: None NPO After: 7:00 PM Lungs: clear IV 0.9% NS with Angio Cath: 24g     IV Site: L Antecubital IV Started by: Irean Hong, RN Chest Size (in) 36     Cup Size D     Height (in): 64 Weight (lb): 157 BMI: 27.05 Tech Comments: Held metoprolol 24 hrs.  Nuclear Med Study 1 or 2 day study:  1 day     Stress Test Type:  Eugenie Birks Reading MD:  Olga Millers, MD     Referring MD:  S.Tennant Resting Radionuclide:  Technetium 4m Tetrofosmin     Resting Radionuclide Dose:  11.0 mCi  Stress Radionuclide:  Technetium 37m Tetrofosmin     Stress Radionuclide Dose:  33.0 mCi   Stress Protocol   Lexiscan: 0.4 mg   Stress Test Technologist:  Milana Na, EMT-P     Nuclear Technologist:  Doyne Keel, CNMT  Rest Procedure  Myocardial perfusion imaging was performed at rest 45 minutes following the intravenous administration of Technetium 49m Tetrofosmin.  Stress Procedure  The patient received IV Lexiscan 0.4 mg over 15-seconds.  Technetium 36m Tetrofosmin injected at 30-seconds.  There were no significant changes with infusion.  Quantitative spect images were obtained after a 45 minute delay.  QPS Raw Data Images:  There is interference from nuclear activity from structures below the diaphragm.   Stress Images:  There is decreased uptake in the inferoapex. Rest Images:  There is decreased uptake in the inferoapex. Subtraction (SDS):  No evidence of  ischemia. Transient Ischemic Dilatation:  0.99  (Normal <1.22)  Lung/Heart Ratio:  0.32  (Normal <0.45)  Quantitative Gated Spect Images QGS EDV:  59 ml QGS ESV:  13 ml QGS EF:  78 % QGS cine images:  Normal wall motion.   Overall Impression  Exercise Capacity: Lexiscan with no exercise. BP Response: Normal blood pressure response. Clinical Symptoms: Mild chest pain/dyspnea. ECG Impression: No significant ST segment change suggestive of ischemia. Overall Impression: Abnormal lexiscan nuclear study with small prior inferoapical infarct; no ischemia.  Appended Document: Cardiology Nuclear Testing copy sent to Dr. Deborah Chalk

## 2010-06-10 NOTE — Progress Notes (Signed)
Summary: Nuc Pre-Procedure  Phone Note Outgoing Call   Call placed by: Antionette Char RN,  February 19, 2010 3:32 PM Call placed to: Patient Reason for Call: Confirm/change Appt Summary of Call: Left message with information on My[employer] Information Sheet (see scanned document for details).     Nuclear Med Background Indications for Stress Test: Evaluation for Ischemia, Stent Patency   History: Angioplasty, Heart Catheterization, Myocardial Infarction, Stents  History Comments: 04 IWMI     Nuclear Pre-Procedure Cardiac Risk Factors: Family History - CAD, History of Smoking, Hypertension Height (in): 64

## 2010-06-10 NOTE — Progress Notes (Signed)
Summary: triage  Phone Note Call from Patient Call back at [phone]   Caller: daughter- Angelica Chessman Summary of Call: having some issues and needs to talk to nurse Initial call taken by: De Nurse,  Sep 12, 2009 9:23 AM  Follow-up for Phone Call        spoke with dtr.  her mom gets stomach pain every time she eats out. "we eat out a lot" sometimes happens at home.  Hx of constipation. stools once a week. does not take anything for this. this has always been her habit. dtr is concerned. appt tomorrow with Dr. Clotilde Dieter at 10. aware she will not be seeing her pcp Follow-up by: Golden Circle RN,  Sep 12, 2009 9:30 AM

## 2010-06-25 ENCOUNTER — Other Ambulatory Visit (INDEPENDENT_AMBULATORY_CARE_PROVIDER_SITE_OTHER): Payer: 59

## 2010-06-25 DIAGNOSIS — I2510 Atherosclerotic heart disease of native coronary artery without angina pectoris: Secondary | ICD-10-CM

## 2010-06-25 DIAGNOSIS — I10 Essential (primary) hypertension: Secondary | ICD-10-CM

## 2010-06-25 DIAGNOSIS — E7800 Pure hypercholesterolemia, unspecified: Secondary | ICD-10-CM

## 2010-08-27 ENCOUNTER — Ambulatory Visit: Payer: 59 | Admitting: Family Medicine

## 2010-09-23 NOTE — Op Note (Signed)
NAME:  JEFFRIESJakya, Dovidio NO.:  [id number]   MEDICAL RECORD NO.:  [id number]          PATIENT TYPE:  AMB   LOCATION:  SDS                          FACILITY:  [facility]   PHYSICIAN:  Sharlet Salina T. Hoxworth, M.D.DATE OF BIRTH:  1955/05/15   DATE OF PROCEDURE:  11/08/2006  DATE OF DISCHARGE:  11/08/2006                               OPERATIVE REPORT   PREOPERATIVE DIAGNOSIS:  Abnormal mammogram right breast.   POSTOPERATIVE DIAGNOSIS:  Abnormal mammogram right breast.   SURGICAL PROCEDURES:  Needle-localized right breast biopsy.   SURGEON:  Dr. Johna Sheriff   ANESTHESIA:  LMA   BRIEF HISTORY:  The patient is a 55 year old female with recent  screening mammogram was found to have a faint but definite cluster of  abnormal calcifications in the upper right breast.  Large core needle  biopsy was performed.  However, pathology did not show calcifications  and there was a judgment of the radiologist that open excisional biopsy  due to discordant pathology was indicated.  This was discussed with the  patient who is agreeable.  She in addition has subglandular implants  epithelialized directly anterior to the implant.  The nature of  procedure, its indications, risks of bleeding, infection, implant  rupture discussed and understood.  Following successful needle  localization the patient brought to operating room for excisional  biopsy.   DESCRIPTION OF PROCEDURE:  The patient brought to operating room and  placed in supine position on the table and LMA general anesthesia was  induced.  Right breast was sterilely prepped and draped.  Correct  patient and procedure were verified.  A curvilinear incision was made  just above the areolar edge just lateral to the wires insertion site  with the wire track running medial to lateral.  Dissection was carried  down through subcutaneous tissue and the wire was brought into the  incision.  The clipped area calcifications lay just posterior  the shaft  of the wire.  Specimen of breast tissue was excised around the shaft of  the wire mostly posterior to it as dissection was carried down deeply.  The implant capsule was identified and the specimen was essentially  taken directly up off of the implant posteriorly.  The implant was  protected and did not appear injured.  Hemostasis obtained with cautery  and couple figure-of-eight sutures of Vicryl.  The wound was closed with  running subcuticular 4-0 Monocryl and Steri-Strips.  Specimen radiograph  showed calcifications within the specimen.  The sponge and needle counts  correct.  Dressings was applied.  The patient taken to recovery room in  good condition.      Lorne Skeens. Hoxworth, M.D.  Electronically Signed     BTH/MEDQ  D:  11/08/2006  T:  11/09/2006  Job:  [id number]

## 2010-09-26 ENCOUNTER — Telehealth: Payer: Self-pay | Admitting: Cardiology

## 2010-09-26 NOTE — Telephone Encounter (Signed)
Angelica Chessman (pt's daughter) would like to know when pt needs a nuclear stress test.  RN notified Angelica Chessman we typically suggest nuclear's every two years, unless pt is having problems.  Angelica Chessman would like to know who pt will see once Dr. Cristobal Goldmann.  Dr. Deborah Chalk notified and suggested pt f.u with Dr. Clifton James.  Mandy notified and will discuss with her mom and call back.

## 2010-09-26 NOTE — Telephone Encounter (Signed)
PT'S DAUGHTER WANTS TO KNOW WHEN PT'S LAST STRESS TEST WAS AND THAT SHE THINKS SHE IS DUE FOR ONE AGAIN AS PER HER, PT HAS THEM EVERY ?

## 2010-09-26 NOTE — Telephone Encounter (Signed)
Called wanting to know what the status of her mother's prescription that is no longer going to be covered by her insurance is. Please call back.

## 2010-09-26 NOTE — Telephone Encounter (Signed)
Left message for Angelica Chessman to call back on Monday.

## 2010-09-26 NOTE — H&P (Signed)
NAME:  Jamie Pennington, Jamie Pennington                  ACCOUNT NO.:  [id number]   MEDICAL RECORD NO.:  [id number]                   PATIENT TYPE:  OIB   LOCATION:                                       FACILITY:  [facility]   PHYSICIAN:  Colleen Can. Deborah Chalk, M.D.            DATE OF BIRTH:  [birth year]   DATE OF ADMISSION:  01/02/2003  DATE OF DISCHARGE:                                HISTORY & PHYSICAL   CHIEF COMPLAINT:  Chest pain.   HISTORY AND PHYSICAL:  The patient is a very pleasant 55 year old white  female who has known history of coronary disease. She suffered an inferior  myocardial infarction on March 18. She has been referred for a Cardiolite  study due to varying degrees of vague chest tightness with exertion. She  underwent stress Cardiolite study which showed inferolateral redistribution  with perfusion imaging. There was well preserved LV function with an  ejection fraction of 60%. Blood pressure response was adequate. The EKG was  negative. This was felt to be somewhat of an equivocal study; however, in  light of partial return of symptoms and the abnormal perfusion, we will now  proceed on with cardiac catheterization.   PAST MEDICAL HISTORY:  1. Atherosclerotic cardiovascular disease with previous inferior MI on July 27, 2002 with angioplasty and stent placement to the right coronary     artery with a 3.5 x 31 mm Radius stent.  2. Positive family history of coronary disease.  3. Hypertension.   ALLERGIES:  None.   CURRENT MEDICATIONS:  1. Claritin daily.  2. Vitamins daily.  3. Aspirin daily.  4. B12 and calcium daily.  5. Lopressor 25 b.i.d.  6. Altace 2.5 daily.  7. Lipitor 10 q.d.  8. Plavix 75 mg q.d.   FAMILY HISTORY:  Her father died in his 63s of a stroke. Mother died in her  76s with Alzheimer's. She has three sisters; one who has had previous  myocardial infarction as well as bypass surgery.   SOCIAL HISTORY:  There is no alcohol or tobacco. She lives  at home alone.  She works at the [employer] as well as at [employer]. There is  no alcohol or tobacco.   REVIEW OF SYSTEMS:  Basically as noted above and otherwise unremarkable.   PHYSICAL EXAMINATION:  GENERAL:  She is a very pleasant white female in no  acute distress.  VITAL SIGNS:  Blood pressure is 130/84 sitting, 128/80 standing, heart rate  72 and regular, respirations 18. She is afebrile.  SKIN:  Warm and dry. Color is unremarkable.  LUNGS:  Clear.  HEART:  Shows a regular rhythm.  ABDOMEN:  Soft, positive bowel sounds, nontender.  EXTREMITIES:  Without edema.  NEUROLOGICAL:  Intact without gross focal deficit.   LABORATORY DATA:  Pending.   OVERALL IMPRESSION:  1. Recurrent episodes of chest pain.  2. Known coronary disease with previous MI in March of  2004 with stent     placement to the right coronary.  3. Abnormal stress Cardiolite study.  4. Positive family history.  5. Hyperlipidemia.  6. Hypertension, currently controlled.   PLAN:  We will proceed on with repeat cardiac catheterization. The patient  has been reviewed in full detail, and she is willing to proceed on Tuesday,  January 02, 2003.      Juanell Fairly C. Earl Gala, N.P.                 Colleen Can. Deborah Chalk, M.D.    LCO/MEDQ  D:  01/01/2003  T:  01/01/2003  Job:  [id number]

## 2010-10-08 NOTE — Telephone Encounter (Signed)
Spoke with pt's daughter Angelica Chessman regarding insurance issues with certain medications and f/u appointment.  Pt would like to see Dr. Dietrich Pates since Dr. Deborah Chalk is retiring.  RN set pt up to see Dr. Tenny Craw on 10/17/10 at 215 pm and pt will discuss medication issues at appointment.  Mandy notified.

## 2010-10-16 ENCOUNTER — Encounter: Payer: Self-pay | Admitting: Internal Medicine

## 2010-10-17 ENCOUNTER — Ambulatory Visit (INDEPENDENT_AMBULATORY_CARE_PROVIDER_SITE_OTHER): Payer: 59 | Admitting: Internal Medicine

## 2010-10-17 ENCOUNTER — Encounter: Payer: Self-pay | Admitting: Internal Medicine

## 2010-10-17 DIAGNOSIS — I2510 Atherosclerotic heart disease of native coronary artery without angina pectoris: Secondary | ICD-10-CM

## 2010-10-17 DIAGNOSIS — I119 Hypertensive heart disease without heart failure: Secondary | ICD-10-CM

## 2010-10-17 DIAGNOSIS — E785 Hyperlipidemia, unspecified: Secondary | ICD-10-CM

## 2010-10-17 DIAGNOSIS — I10 Essential (primary) hypertension: Secondary | ICD-10-CM

## 2010-10-17 NOTE — Patient Instructions (Signed)
Your physician wants you to follow-up in: 6-8 months with Dr. Tenny Craw. You will receive a reminder letter in the mail two months in advance. If you don't receive a letter, please call our office to schedule the follow-up appointment.

## 2010-10-20 DIAGNOSIS — E785 Hyperlipidemia, unspecified: Secondary | ICD-10-CM | POA: Insufficient documentation

## 2010-10-20 NOTE — Assessment & Plan Note (Signed)
Good control

## 2010-10-20 NOTE — Assessment & Plan Note (Signed)
Will need to review recent labs.

## 2010-10-20 NOTE — Progress Notes (Signed)
HPI  Patient is a 55 year old who has been followed by S. Tennant.  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  Residual CAD to the LCx. She was recently seen in clinic She has been doing well.  Denies chest pain.  Breathing is OK.  Last myoview was in the fall of last year showing no ischemia  No Known Allergies  Current Outpatient Prescriptions  Medication Sig Dispense Refill  . Ascorbic Acid (VITAMIN C) 1000 MG tablet Take 1,000 mg by mouth daily.        Marland Kitchen aspirin 325 MG tablet Take 325 mg by mouth daily.        Marland Kitchen atorvastatin (LIPITOR) 20 MG tablet Take 20 mg by mouth daily.        . calcium-vitamin D (OSCAL WITH D 500-200) 500-200 MG-UNIT per tablet Take 1 tablet by mouth 2 (two) times daily.        Marland Kitchen ezetimibe (ZETIA) 10 MG tablet Take 10 mg by mouth daily.        . fish oil-omega-3 fatty acids 1000 MG capsule Take 2 g by mouth daily.        . Flaxseed, Linseed, (FLAX SEED OIL) 1000 MG CAPS Take 2 capsules by mouth daily.        Marland Kitchen ibuprofen (ADVIL,MOTRIN) 800 MG tablet Take 800 mg by mouth every 6 (six) hours as needed.        . loratadine (CLARITIN) 10 MG tablet Take 10 mg by mouth daily.        . metoprolol (LOPRESSOR) 50 MG tablet Take 25 mg by mouth 2 (two) times daily.        . Multiple Vitamin (MULTIVITAMIN) tablet Take 1 tablet by mouth daily.        . nitroGLYCERIN (NITROSTAT) 0.4 MG SL tablet Place 0.4 mg under the tongue every 5 (five) minutes as needed.        . ramipril (ALTACE) 2.5 MG capsule Take 2.5 mg by mouth daily.        . vitamin E 400 UNIT capsule Take 400 Units by mouth daily.          Past Medical History  Diagnosis Date  . Myocardial infarct     hx of . s/p stent placement in 2004  . Hypertension   . Hyperlipidemia   . Coronary artery disease     Past Surgical History  Procedure Date  . Knee surgery 2010    left  . Cardiac catheterization 2004  . Angioplasty     with stent  placement to the right coronary.  . Breast biopsy      right    Family History  Problem Relation Age of Onset  . Stroke Father 54    died  . Alzheimer's disease Mother 22    died  . Heart attack Sister     and bypass surgery    History   Social History  . Marital Status: Legally Separated    Spouse Name: N/A    Number of Children: N/A  . Years of Education: N/A   Occupational History  . Not on file.   Social History Main Topics  . Smoking status: Never Smoker   . Smokeless tobacco: Not on file  . Alcohol Use: No  . Drug Use: No  . Sexually Active: Not on file   Other Topics Concern  . Not on file   Social History Narrative  .  No narrative on file    Review of Systems:  All systems reviewed.  They are negative to the above problem except as previously stated.  Vital Signs: BP 124/72  Pulse 64  Ht 5\' 3"  (1.6 m)  Wt 156 lb (70.761 kg)  BMI 27.63 kg/m2  Physical Exam  Patient is in NAD  HEENT:  Normocephalic, atraumatic. EOMI, PERRLA.  Neck: JVP is normal. No thyromegaly. No bruits.  Lungs: clear to auscultation. No rales no wheezes.  Heart: Regular rate and rhythm. Normal S1, S2. No S3.   No significant murmurs. PMI not displaced.  Abdomen:  Supple, nontender. Normal bowel sounds. No masses. No hepatomegaly.  Extremities:   Good distal pulses throughout. No lower extremity edema.  Musculoskeletal :moving all extremities.  Neuro:   alert and oriented x3.  CN II-XII grossly intact.  EKG:  Sinus rhythm.  64 bpm.  ST changes consistent with repolarization.   Assessment and Plan:  \

## 2010-10-20 NOTE — Assessment & Plan Note (Signed)
Doing well.  No chest pain.  Encouraged her to stay active.

## 2010-11-10 ENCOUNTER — Encounter: Payer: Self-pay | Admitting: Internal Medicine

## 2010-11-13 ENCOUNTER — Encounter: Payer: Self-pay | Admitting: Internal Medicine

## 2011-01-01 ENCOUNTER — Encounter: Payer: Self-pay | Admitting: Internal Medicine

## 2011-01-01 ENCOUNTER — Telehealth: Payer: Self-pay | Admitting: Internal Medicine

## 2011-01-01 ENCOUNTER — Ambulatory Visit (INDEPENDENT_AMBULATORY_CARE_PROVIDER_SITE_OTHER): Payer: 59

## 2011-01-01 ENCOUNTER — Ambulatory Visit (INDEPENDENT_AMBULATORY_CARE_PROVIDER_SITE_OTHER): Payer: 59 | Admitting: Internal Medicine

## 2011-01-01 VITALS — BP 150/82 | HR 62 | Ht 64.0 in | Wt 155.0 lb

## 2011-01-01 DIAGNOSIS — R0989 Other specified symptoms and signs involving the circulatory and respiratory systems: Secondary | ICD-10-CM

## 2011-01-01 DIAGNOSIS — I2510 Atherosclerotic heart disease of native coronary artery without angina pectoris: Secondary | ICD-10-CM

## 2011-01-01 MED ORDER — NITROGLYCERIN 0.4 MG SL SUBL: 0.4000 mg | SUBLINGUAL_TABLET | SUBLINGUAL | Status: DC | PRN

## 2011-01-01 NOTE — Telephone Encounter (Signed)
Patient came into the office for an EKG. EKG was normal. She was still experiencing CP 2/10. I gave her one SL Nitro with relief. Dr. Graciela Husbands would still like to evaluate her in the office. Please refer to his office note.

## 2011-01-01 NOTE — Telephone Encounter (Signed)
Patient is having pressure in her chest for about 1 week. It is intermittent. Denies SOB. She does not have any current SL Nitro so I have refilled this for her. She would like to be seen to help relieve anxiety so I will bring her in for an EKG and have her try taking SL Nitro to see if her pain subsides. She does have a history of CAD.

## 2011-01-01 NOTE — Telephone Encounter (Signed)
Unable to reach patient at her work number, which was the number left to reach her.

## 2011-01-01 NOTE — Patient Instructions (Signed)
Patient came into the office for an EKG to evaluate chest pain x 1 week. SL Nitro x 1 relieved her pain. Please refer to Dr. Odessa Fleming office note. He evaluated her as office DOD.

## 2011-01-01 NOTE — Assessment & Plan Note (Signed)
The patient has significant chest pain similarities to her preinfarction chest pain syndrome. As for a partially at that time would've her symptoms were also nondiagnostic. 2 strategies present themselves, the first would be Myoview scanning the other would be catheterization. However, with her high pretest probability a negative Myoview scan would be unsatisfying and unconvincing. Hence I think we should proceed with catheterization. Risk benefits and alternatives have been reviewed with the patient.

## 2011-01-01 NOTE — Patient Instructions (Signed)

## 2011-01-01 NOTE — Telephone Encounter (Signed)
C/o chest presssure. Would like to come in for lab work .

## 2011-01-01 NOTE — Progress Notes (Signed)
  HPI  Jamie Pennington is a 55 y.o. female He has an add-on today because of chest discomfort over the last week. It has been related with activity. It is unassociated with epi phenomenon including shortness of breath diaphoresis nausea or radiation. It is unfortunately however quite reminiscent of the chest discomfort that preceded her in mind 2004. At that time she also had no other cardiographic abnormalities and had atypical chest pain features.  Myoview last fall that was nonischemic.  Past Medical History  Diagnosis Date  . Myocardial infarct     hx of . s/p stent placement in 2004  . Hypertension   . Hyperlipidemia   . Coronary artery disease     Past Surgical History  Procedure Date  . Knee surgery 2010    left  . Cardiac catheterization 2004  . Angioplasty     with stent  placement to the right coronary.  . Breast biopsy     right    Current Outpatient Prescriptions  Medication Sig Dispense Refill  . Ascorbic Acid (VITAMIN C) 1000 MG tablet Take 1,000 mg by mouth daily.        Marland Kitchen aspirin 325 MG tablet Take 325 mg by mouth daily.        Marland Kitchen atorvastatin (LIPITOR) 20 MG tablet Take 20 mg by mouth daily.        . calcium-vitamin D (OSCAL WITH D 500-200) 500-200 MG-UNIT per tablet Take 1 tablet by mouth 2 (two) times daily.        Marland Kitchen ezetimibe (ZETIA) 10 MG tablet Take 10 mg by mouth daily.        . fish oil-omega-3 fatty acids 1000 MG capsule Take 2 g by mouth daily.        . Flaxseed, Linseed, (FLAX SEED OIL) 1000 MG CAPS Take 2 capsules by mouth daily.        Marland Kitchen ibuprofen (ADVIL,MOTRIN) 800 MG tablet Take 800 mg by mouth every 6 (six) hours as needed.        . loratadine (CLARITIN) 10 MG tablet Take 10 mg by mouth daily. As needed      . metoprolol (LOPRESSOR) 50 MG tablet Take 25 mg by mouth 2 (two) times daily.        . Multiple Vitamin (MULTIVITAMIN) tablet Take 1 tablet by mouth daily.        . nitroGLYCERIN (NITROSTAT) 0.4 MG SL tablet Place 1 tablet (0.4 mg total) under  the tongue every 5 (five) minutes as needed.  25 tablet  3  . ramipril (ALTACE) 2.5 MG capsule Take 2.5 mg by mouth daily.        . vitamin E 400 UNIT capsule Take 400 Units by mouth daily.          No Known Allergies  Review of Systems negative except from HPI and PMH  Physical Exam Well developed and well nourished in no acute distress HENT normal E scleral and icterus clear Neck Supple JVP flat; carotids brisk and full Clear to ausculation Regular rate and rhythm, no murmurs gallops or rub Soft with active bowel sounds No clubbing cyanosis and edema Alert and oriented, grossly normal motor and sensory function Skin Warm and Dry  ECG sinus rhythm at 62 Intervals 0.17/29/0.40 Axis is 50  Assessment and  Plan

## 2011-01-01 NOTE — Telephone Encounter (Signed)
Left message  to call back at home phone.

## 2011-01-01 NOTE — Telephone Encounter (Signed)
Per pt call, pt said she needs to have blood work done and wants to know if she can come in tomorrow. Please return pt call to advise/discuss.

## 2011-01-02 ENCOUNTER — Ambulatory Visit (HOSPITAL_COMMUNITY)
Admission: RE | Admit: 2011-01-02 | Discharge: 2011-01-02 | Disposition: A | Discharge status: Home / Self Care | Payer: 59 | Source: Ambulatory Visit | Attending: Cardiovascular Disease | Admitting: Cardiovascular Disease

## 2011-01-02 DIAGNOSIS — R0789 Other chest pain: Secondary | ICD-10-CM | POA: Insufficient documentation

## 2011-01-02 DIAGNOSIS — I2510 Atherosclerotic heart disease of native coronary artery without angina pectoris: Secondary | ICD-10-CM

## 2011-01-02 DIAGNOSIS — Z9861 Coronary angioplasty status: Secondary | ICD-10-CM | POA: Insufficient documentation

## 2011-01-02 LAB — CBC
HCT: 44.4 % (ref 36.0–46.0)
Hemoglobin: 15.3 g/dL — ABNORMAL HIGH (ref 12.0–15.0)
MCV: 87.7 fL (ref 78.0–100.0)
Platelets: 269 10*3/uL (ref 150–400)
RBC: 5.06 MIL/uL (ref 3.87–5.11)
RDW: 12.7 % (ref 11.5–15.5)
WBC: 6.3 10*3/uL (ref 4.0–10.5)

## 2011-01-02 LAB — APTT: aPTT: 31 seconds (ref 24–37)

## 2011-01-02 LAB — BASIC METABOLIC PANEL
BUN: 24 mg/dL — ABNORMAL HIGH (ref 6–23)
CO2: 27 mEq/L (ref 19–32)
Chloride: 107 mEq/L (ref 96–112)
Creatinine, Ser: 0.59 mg/dL (ref 0.50–1.10)
Glucose, Bld: 106 mg/dL — ABNORMAL HIGH (ref 70–99)
Potassium: 5.2 mEq/L — ABNORMAL HIGH (ref 3.5–5.1)
Sodium: 140 mEq/L (ref 135–145)

## 2011-01-02 LAB — PROTIME-INR: INR: 0.9 (ref 0.00–1.49)

## 2011-01-05 ENCOUNTER — Encounter: Payer: Self-pay | Admitting: *Deleted

## 2011-01-06 NOTE — Cardiovascular Report (Signed)
  NAME:  Jamie Pennington, Jamie Pennington NO.:  [id number]  MEDICAL RECORD NO.:  [id number]  LOCATION:  [facility]                         FACILITY:  [facility]  PHYSICIAN:  Noralyn Pick. Eden Emms, MD, FACCDATE OF BIRTH:  [birth year]  DATE OF PROCEDURE: DATE OF DISCHARGE:                           CARDIAC CATHETERIZATION   PROCEDURE:  Coronary arteriography.  INDICATIONS:  A 55 year old patient with previous stent to the right coronary artery.  This was done in 69 in [place].  The patient has had recurrent chest pain.  Study was done to rule out restenosis of her previously placed stent or progressive disease in the left system.  Cine catheterization was done with 5-French catheter from right femoral artery.  Standard JR-4 and JL-4 catheters were used.  Left main coronary artery was normal.  Left anterior descending artery was normal.  The distal vessel was somewhat small.  The patient had a small first diagonal branch, which was normal.  There was a larger branching second diagonal branch, which was normal.  Circumflex coronary artery was nondominant.  There was a small first obtuse marginal branch, which was normal.  There was a larger second obtuse marginal branch, which was normal.  The AV groove circ was normal.  Right coronary artery was dominant.  There was a large area of stenting involving the distal proximal right coronary artery as well as the mid section.  There was 30% ostial stenosis.  The remainder of the right coronary artery was widely patent.  PDA and posterior lateral branches were normal.  RAO ventriculography:  RAO ventriculography showed a small area of hypokinesis in the mid inferior wall.  EF was preserved at 55%.  There was no gradient across the aortic valve and no MR.  LV pressure was 141/9.  Aortic pressure was 141/69.  IMPRESSION:  The patient has no restenosis or no new lesions in the left system.  Her chest pain would appear to be noncardiac in etiology.   She tolerated the procedure well.     Noralyn Pick. Eden Emms, MD, [facility]     PCN/MEDQ  D:  01/02/2011  T:  01/02/2011  Job:  [id number]  cc:   [facility] Duke Salvia, MD, [facility]  Electronically Signed by Charlton Haws MD [facility] on 01/06/2011 02:03:39 PM

## 2011-01-20 NOTE — Progress Notes (Signed)
Addended by: Judithe Modest D on: 01/20/2011 02:51 PM   Modules accepted: Orders

## 2011-02-11 ENCOUNTER — Ambulatory Visit (INDEPENDENT_AMBULATORY_CARE_PROVIDER_SITE_OTHER): Payer: 59 | Admitting: Family Medicine

## 2011-02-11 ENCOUNTER — Encounter: Payer: Self-pay | Admitting: Family Medicine

## 2011-02-11 VITALS — BP 141/84 | HR 74 | Temp 97.8°F | Ht 64.0 in | Wt 161.0 lb

## 2011-02-11 DIAGNOSIS — M722 Plantar fascial fibromatosis: Secondary | ICD-10-CM | POA: Insufficient documentation

## 2011-02-11 DIAGNOSIS — Z23 Encounter for immunization: Secondary | ICD-10-CM

## 2011-02-11 DIAGNOSIS — E785 Hyperlipidemia, unspecified: Secondary | ICD-10-CM

## 2011-02-11 DIAGNOSIS — R609 Edema, unspecified: Secondary | ICD-10-CM

## 2011-02-11 LAB — LIPID PANEL
Cholesterol: 171 mg/dL (ref 0–200)
HDL: 53 mg/dL (ref >39)
Total CHOL/HDL Ratio: 3.2 Ratio
Triglycerides: 149 mg/dL (ref <150)
VLDL: 30 mg/dL (ref 0–40)

## 2011-02-11 LAB — HEPATIC FUNCTION PANEL
ALT: 24 U/L (ref 0–35)
AST: 21 U/L (ref 0–37)
Albumin: 4.3 g/dL (ref 3.5–5.2)
Alkaline Phosphatase: 68 U/L (ref 39–117)
Bilirubin, Direct: 0.1 mg/dL (ref 0.0–0.3)
Indirect Bilirubin: 0.5 mg/dL (ref 0.0–0.9)
Total Bilirubin: 0.6 mg/dL (ref 0.3–1.2)

## 2011-02-11 NOTE — Assessment & Plan Note (Addendum)
Likely dependent due to mild venous insufficiency.  Will complete lab workup by checking LFTs.  With recent normal heart cath no need for cardiac evaluation.   Suggest compression and elevation and salt restriction

## 2011-02-11 NOTE — Patient Instructions (Signed)
Plantar Fascitis Use arch supports with heel cup whenever you walk Ice your heel after you have been walking Use Tylenol or ibuprofen as needed If worsening or no better in 6 months come back  Edema Related to being on your feet Will check labs and call you if any problems Wear support hose when on your feet and elevate them at the end of the day If it is getting worse or you have any shortness of breath call us

## 2011-02-11 NOTE — Assessment & Plan Note (Signed)
New.   Classic symptoms.  Try shoe inserts and ice.  Discussed prognosis and duration

## 2011-02-11 NOTE — Progress Notes (Signed)
  Subjective:    Patient ID: Francella Solian, female    DOB: [birth year], 55 y.o.   MRN: [id number]  HPI  Feet Swelling Swelling in her ankles for about 2 months.  Seems worse at end of day.  Better after rest.  No pain.   No other swelling no chest pain on exertion, no shortness of breath , no orthopnea.   No recent med changes or large NSAID use   Left Heel Pain Pain in heel when first walks on it is better after walking for awhile.  No injury no redness or swelling.  No other joint pain.  Review of Symptoms - see HPI  PMH - Smoking status noted.   Had recent heart cath and CBC and BMET were normal    Review of Systems     Objective:   Physical Exam  Extremities:  No cyanosis, edema, or deformity noted with good range of motion of all major joints.  No edema at ankles.  No calf tenderness or deformity Heels - tender over plantar fascia insertion on Left.  No deformity or redness.  Palpation reproduces her symptoms        Assessment & Plan:

## 2011-02-12 ENCOUNTER — Encounter: Payer: Self-pay | Admitting: Family Medicine

## 2011-02-25 LAB — URINALYSIS, ROUTINE W REFLEX MICROSCOPIC
Bilirubin Urine: NEGATIVE
Glucose, UA: NEGATIVE
Hgb urine dipstick: NEGATIVE
Ketones, ur: NEGATIVE
Leukocytes, UA: NEGATIVE
Nitrite: NEGATIVE
Specific Gravity, Urine: 1.019
pH: 6.5

## 2011-02-25 LAB — COMPREHENSIVE METABOLIC PANEL
ALT: 35
AST: 32
Albumin: 3.6
Alkaline Phosphatase: 65
BUN: 20
CO2: 29
Calcium: 8.9
GFR calc Af Amer: >60
GFR calc non Af Amer: >60
Sodium: 136

## 2011-02-25 LAB — CBC
MCHC: 34.1
MCV: 91.4
Platelets: 328
RBC: 4.68
WBC: 6.4

## 2011-02-25 LAB — DIFFERENTIAL
Basophils Absolute: 0
Eosinophils Relative: 1
Lymphocytes Relative: 23
Lymphs Abs: 1.5
Monocytes Absolute: 0.6
Monocytes Relative: 9
Neutro Abs: 4.3
Neutrophils Relative %: 66

## 2011-03-08 ENCOUNTER — Other Ambulatory Visit: Payer: Self-pay | Admitting: Nurse Practitioner

## 2011-03-09 ENCOUNTER — Telehealth: Payer: Self-pay | Admitting: Internal Medicine

## 2011-03-09 NOTE — Telephone Encounter (Signed)
Called pharmacy. They got a script from [place] [facility] with Altace 5mg  one half every day. Medication is a capsule and can't be split. Ordered Altace 2.5 mg every day.

## 2011-03-09 NOTE — Telephone Encounter (Signed)
Phar has a question about Ramipril dosage.

## 2011-04-24 ENCOUNTER — Ambulatory Visit (INDEPENDENT_AMBULATORY_CARE_PROVIDER_SITE_OTHER): Payer: 59

## 2011-04-24 DIAGNOSIS — J069 Acute upper respiratory infection, unspecified: Secondary | ICD-10-CM

## 2012-01-19 ENCOUNTER — Ambulatory Visit (INDEPENDENT_AMBULATORY_CARE_PROVIDER_SITE_OTHER): Payer: 59 | Admitting: Internal Medicine

## 2012-01-19 VITALS — BP 150/94 | HR 90 | Temp 98.8°F | Resp 18 | Ht 64.5 in | Wt 171.0 lb

## 2012-01-19 DIAGNOSIS — R05 Cough: Secondary | ICD-10-CM

## 2012-01-19 DIAGNOSIS — J40 Bronchitis, not specified as acute or chronic: Secondary | ICD-10-CM

## 2012-01-19 DIAGNOSIS — R059 Cough, unspecified: Secondary | ICD-10-CM

## 2012-01-19 MED ORDER — HYDROCODONE-ACETAMINOPHEN 7.5-500 MG/15ML PO SOLN: 5.0000 mL | Freq: Four times a day (QID) | ORAL | Status: AC | PRN

## 2012-01-19 MED ORDER — AZITHROMYCIN 250 MG PO TABS: ORAL_TABLET | ORAL | Status: AC

## 2012-01-19 NOTE — Patient Instructions (Addendum)

## 2012-01-19 NOTE — Progress Notes (Signed)
  Subjective:    Patient ID: Jamie Pennington, female    DOB: [birth year], 56 y.o.   MRN: [id number]  HPI New cough and head and chest congestion. No sob or cp.   Review of Systems MI/ with [place]    Objective:   Physical Exam Rhinorrhea Coarse rhonchi and mild chest congestion Heart ok       Assessment & Plan:  Bronchitis

## 2012-07-05 ENCOUNTER — Ambulatory Visit (INDEPENDENT_AMBULATORY_CARE_PROVIDER_SITE_OTHER): Payer: 59 | Admitting: Family Medicine

## 2012-07-05 ENCOUNTER — Encounter: Payer: Self-pay | Admitting: Family Medicine

## 2012-07-05 VITALS — BP 171/100 | HR 78 | Ht 64.0 in | Wt 170.9 lb

## 2012-07-05 DIAGNOSIS — I10 Essential (primary) hypertension: Secondary | ICD-10-CM

## 2012-07-05 LAB — COMPREHENSIVE METABOLIC PANEL
ALT: 39 U/L — ABNORMAL HIGH (ref 0–35)
Albumin: 4.4 g/dL (ref 3.5–5.2)
Alkaline Phosphatase: 69 U/L (ref 39–117)
BUN: 15 mg/dL (ref 6–23)
CO2: 26 mEq/L (ref 19–32)
Calcium: 9.5 mg/dL (ref 8.4–10.5)
Chloride: 105 mEq/L (ref 96–112)
Creat: 0.65 mg/dL (ref 0.50–1.10)
Glucose, Bld: 93 mg/dL (ref 70–99)
Potassium: 4.2 mEq/L (ref 3.5–5.3)
Sodium: 140 mEq/L (ref 135–145)

## 2012-07-05 LAB — LIPID PANEL
Cholesterol: 244 mg/dL — ABNORMAL HIGH (ref 0–200)
HDL: 54 mg/dL (ref >39)
Total CHOL/HDL Ratio: 4.5 Ratio

## 2012-07-05 MED ORDER — ATORVASTATIN CALCIUM 20 MG PO TABS: 20.0000 mg | ORAL_TABLET | Freq: Every day | ORAL | Status: DC

## 2012-07-05 MED ORDER — METOPROLOL TARTRATE 25 MG PO TABS: 25.0000 mg | ORAL_TABLET | Freq: Two times a day (BID) | ORAL | Status: DC

## 2012-07-05 MED ORDER — RAMIPRIL 2.5 MG PO CAPS: 2.5000 mg | ORAL_CAPSULE | Freq: Every day | ORAL | Status: DC

## 2012-07-05 NOTE — Patient Instructions (Addendum)
It was a pleasure to see you today.  I am glad to hear you are getting back on track with respect to your health.  I refilled your metoprolol, ramipril and lipitor as you had been taking them.  I ask that you make an appointment with Dr Deirdre Priest in the coming month to follow up on the blood pressure response to the medicines, and to address the cyst on the nape of your neck.   Continue taking your aspirin one time daily.  APPOINTMENT WITH DR CHAMBLISS IN THE COMING 4 WEEKS.

## 2012-07-05 NOTE — Progress Notes (Signed)
  Subjective:    Patient ID: Jamie Pennington, female    DOB: 10/08/1955, 57 y.o.   MRN: [id number]  HPI  Patient here for same-day appointment for hypertensive medication refill, ran out 4 to 5 months ago and wishes to have them refilled.  She reports that she has felt well, no chest pain or shortness of breath.  She has had some intermittent headaches which she has attributed to her blood pressure (systolic) getting above 200.    She has a history of prior MI, with cardiac cath and stenting in the RCA in 2004.  She is a former smoker, having quit in the year 2000.  She does not drink alcohol.   During exam she brings attention to a soft painless lump she has felt in the midline at the nape of her neck that hs been growing in size over the past 1 year or so.  Does not suppurate. Not painful.     Review of Systems As above.  Has had multiple bouts of sinusitis recently, has been seen by Dr Perrin Maltese at [facility] and also by her ENT, Dr Annalee Genta.  Completed recent round of antibiotics.      Objective:   Physical Exam Generally well appearing, fidgety, no apparent distress HEENT Neck supple.  Soft, fluctuant 1.5cm diameter mass at nape of neck with apparent sebaceous plug in center.  Dry and without erythema. No cervical adenopathy.  COR Regular S1S2, no extra sounds PULM Clear bilaterally, no rales or wheezes EXT no edema noted in the ankles bilaterally.        Assessment & Plan:

## 2012-07-06 ENCOUNTER — Telehealth: Payer: Self-pay | Admitting: Family Medicine

## 2012-07-06 NOTE — Telephone Encounter (Signed)
Called patient to report on labs; discussed elevated LDL cholesterol. She reports that she does take the lipitor pretty consistently.  Mildly elevated transaminase that can be rechecked in the future before embarking on extensive workup.   Plan for follow up in our office in the coming month to review and recheck BP while on meds.  Paula Compton, MD

## 2012-07-06 NOTE — Assessment & Plan Note (Signed)
Refill of medications for blood pressure, which she has not taken in several months.  Plans to re-establish with Dr Deirdre Priest.  Will get baseline labs today, she should schedule for follow up and recheck of blood pressure in the coming 1 month.

## 2012-07-30 ENCOUNTER — Other Ambulatory Visit: Payer: Self-pay | Admitting: Family Medicine

## 2012-08-02 ENCOUNTER — Ambulatory Visit (INDEPENDENT_AMBULATORY_CARE_PROVIDER_SITE_OTHER): Payer: 59 | Admitting: Family Medicine

## 2012-08-02 ENCOUNTER — Encounter: Payer: Self-pay | Admitting: Family Medicine

## 2012-08-02 VITALS — BP 167/99 | HR 73 | Ht 64.0 in | Wt 170.9 lb

## 2012-08-02 DIAGNOSIS — G5602 Carpal tunnel syndrome, left upper limb: Secondary | ICD-10-CM

## 2012-08-02 DIAGNOSIS — G5600 Carpal tunnel syndrome, unspecified upper limb: Secondary | ICD-10-CM | POA: Insufficient documentation

## 2012-08-02 DIAGNOSIS — R7401 Elevation of levels of liver transaminase levels: Secondary | ICD-10-CM | POA: Insufficient documentation

## 2012-08-02 DIAGNOSIS — M7702 Medial epicondylitis, left elbow: Secondary | ICD-10-CM | POA: Insufficient documentation

## 2012-08-02 DIAGNOSIS — E785 Hyperlipidemia, unspecified: Secondary | ICD-10-CM

## 2012-08-02 DIAGNOSIS — M7700 Medial epicondylitis, unspecified elbow: Secondary | ICD-10-CM

## 2012-08-02 LAB — COMPREHENSIVE METABOLIC PANEL
Albumin: 4.1 g/dL (ref 3.5–5.2)
Alkaline Phosphatase: 69 U/L (ref 39–117)
BUN: 19 mg/dL (ref 6–23)
CO2: 28 mEq/L (ref 19–32)
Chloride: 106 mEq/L (ref 96–112)
Glucose, Bld: 102 mg/dL — ABNORMAL HIGH (ref 70–99)
Sodium: 141 mEq/L (ref 135–145)
Total Bilirubin: 0.6 mg/dL (ref 0.3–1.2)
Total Protein: 6.4 g/dL (ref 6.0–8.3)

## 2012-08-02 LAB — LDL CHOLESTEROL, DIRECT: Direct LDL: 99 mg/dL

## 2012-08-02 NOTE — Patient Instructions (Addendum)
It was a pleasure to see you today.  I believe your left wrist and left elbow pain are related to conditions described below.  For the carpal tunnel syndrome, I recommend a COCK-UP SPLINT (for left wrist) be applied at bedtime and removed each morning.  You may also use anti-inflammatories such as Naproxen 500mg  twice daily OR ibuprofen 400-800mg  by mouth every 8 hours AS NEEDED for discomfort or pain.   Use a bath towel, folded lengthwise, and wrapped around the left elbow.  I am rechecking your FASTING LDL cholesterol and liver enzymes; if the LDL is still elevated, we may need to increase your Lipitor from 20 to 40mg  daily.  If the liver tests are abnormal, this may prompt more of a workup to look for an explanation for this.   Carpal Tunnel Syndrome Fact Sheet You are working at your desk, trying to ignore the tingling or numbness you have had for months in your hand and wrist. Suddenly, a sharp, piercing pain shoots through the wrist and up your arm. Just a passing cramp? More likely you have carpal tunnel syndrome. This is a painful progressive condition caused by compression of a key nerve in the wrist. WHAT IS CARPAL TUNNEL SYNDROME? Carpal tunnel syndrome occurs when the nerve that runs from the forearm into the hand (median nerve), becomes pressed or squeezed at the wrist. The median nerve controls sensations to the palm side of the thumb and fingers (although not the little finger). That nerve also controls impulses to some small muscles in the hand, that allow the fingers and thumb to move. The narrow, rigid passageway of ligament and bones at the base of the hand (carpal tunnel) houses the median nerve and tendons. Sometimes, thickening from irritated tendons or other swelling narrows the tunnel. The narrowing causes the median nerve to be compressed. The result may be pain, weakness, or numbness in the hand and wrist that moves (radiates) up the arm. Although painful sensations may indicate  other conditions, carpal tunnel syndrome is the most common and widely known of the entrapment neuropathies. These are conditions in which the body's peripheral nerves are compressed or traumatized. SYMPTOMS   Symptoms usually start gradually, with:   Frequent burning.   Tingling.   Itching numbness in the palm of the hand and the fingers.  This is especially true in the thumb and the index and middle fingers.  Some carpal tunnel sufferers say their fingers feel useless and swollen. They may feel this way even though little or no swelling is apparent.   The symptoms often first appear in one or both hands during the night, since many people sleep with flexed wrists. A person with carpal tunnel syndrome may wake up feeling the need to shake out the hand or wrist.   As symptoms worsen, people might feel tingling during the day.   Decreased grip strength may make it difficult to:   Form a fist.   Grasp small objects.   Perform other manual tasks.  In chronic and/or untreated cases, the muscles at the base of the thumb may waste away.  Some people are unable to tell between hot and cold by touch.  CAUSES   Carpal tunnel syndrome is often the result of a combination of factors that increase pressure on the median nerve and tendons in the carpal tunnel. It is usually not a problem with the nerve itself.   Most likely the disorder is due to a congenital predisposition. This means that  the carpal tunnel is simply smaller in some people than in others.   Other factors include:   Trauma or injury to the wrist that cause swelling, such as sprain or fracture.   Overactivity of the pituitary gland.   Hypothyroidism.   Rheumatoid arthritis.   Mechanical problems in the wrist joint.   Work stress.   Repeated use of vibrating hand tools.   Fluid retention during pregnancy or menopause.   The development of a cyst or tumor in the canal.   In some cases, no cause can be  identified.   There is little clinical data to prove whether repetitive and forceful movements of the hand and wrist during work or leisure activities can cause carpal tunnel syndrome. Repeated motions performed in the course of normal work or other daily activities can result in repetitive motion disorders such as bursitis and tendonitis. Writer's cramp, a condition in which a lack of fine motor skill coordination and ache and pressure in the fingers, wrist, or forearm is brought on by repetitive activity, is not a symptom of carpal tunnel syndrome.  WHO IS AT RISK OF DEVELOPING CARPAL TUNNEL SYNDROME?  Women are three times more likely than men to develop carpal tunnel syndrome. This may be the result of the carpal tunnel being smaller in women.   The dominant hand is usually affected first and produces the most severe pain.   Diabetes or other metabolic disorders that directly affect the body's nerves make people more susceptible to compression.   Carpal tunnel syndrome usually occurs only in adults.   The risk of developing carpal tunnel syndrome is not confined to people in a single industry or job. However, it is especially common in those performing assembly line work, such as:   Set designer.   Sewing.   Finishing.   Cleaning.   Meat packing.  Carpal tunnel syndrome is three times more common among assemblers than among data-entry personnel. A 2001 study by the [facility] found heavy computer use (up to 7 hours a day) did not increase a person's risk of developing carpal tunnel syndrome.  During 1998, an estimated three of every 10,000 workers lost time from work because of carpal tunnel syndrome. Half of these workers missed more than 10 days of work. The average lifetime cost of carpal tunnel syndrome is estimated to be about $30,000 for each injured worker. This includes medical bills and lost time from work  DIAGNOSIS   Early diagnosis and treatment are important to avoid  permanent damage to the median nerve.   A physical examination of the hands, arms, shoulders, and neck can help determine if the patient's complaints are related to daily activities or to an underlying disorder. An exam can rule out other painful conditions that mimic carpal tunnel syndrome.   The wrist is examined for:   Tenderness.   Swelling.   Warmth.   Discoloration.   Each finger should be tested for sensation. The muscles at the base of the hand should be examined for strength and signs of shrinking (atrophy).   Routine laboratory tests and X-rays can reveal:   Diabetes.   Arthritis.   Fractures.   Physicians can use specific tests to try to produce the symptoms of carpal tunnel syndrome.   In the Tinel test, the caregiver taps or presses on the median nerve in the patient's wrist. The test is positive when tingling in the fingers or a shock-like sensation occurs.   The Phalen or wrist-flexion test  involves having the patient hold their forearms upright by pointing the fingers down and pressing the backs of the hands together. The presence of carpal tunnel syndrome is suggested if one or more symptoms is felt in the fingers within 1 minute.   Caregivers may also ask patients to try to make a movement that brings on symptoms.   Often it is necessary to confirm the diagnosis by use of electrodiagnostic tests.   In a nerve conduction study, electrodes are placed on the hand and wrist. Small electric shocks are applied and the speed with which nerves transmit impulses is measured.   In electromyography, a fine needle is inserted into a muscle. The electrical activity is viewed on a screen. The activity can determine the severity of damage to the median nerve.   Ultrasound imaging can show impaired movement of the median nerve.  TREATMENT  Treatments for carpal tunnel syndrome should begin as early as possible. It should be done under a caregiver's direction. Underlying  causes such as diabetes or arthritis should be treated first. Initial treatment generally involves:  Resting the affected hand and wrist for at least 2 weeks.   Avoiding activities that may worsen symptoms.   Immobilizing (keeping from moving) the wrist in a splint.  These things are all done to avoid further damage from twisting or bending. If there is inflammation, applying cool packs can help reduce swelling. NON-SURGICAL  Drugs - In special circumstances, different drugs can ease the pain and swelling.   Nonsteroidal anti-inflammatory drugs, such as aspirin, and other non-prescription pain relievers, may ease symptoms.   Orally administered diuretics (water pills) can decrease swelling.   Corticosteroids such as prednisone or lidocaine can be injected directly into the wrist or taken by mouth. This can relieve pressure on the median nerve. It may provide immediate, but temporary relief to persons with mild or intermittent symptoms. (Caution: persons with diabetes and those who may be predisposed to diabetes should note that long term use of corticosteroids can make it difficult to regulate insulin levels. Corticosterioids should not be taken without a prescription.)   Additionally, some studies show that vitamin B6 (pyridoxine) supplements may ease the symptoms of carpal tunnel syndrome.   Exercise - Stretching and strengthening exercises can be helpful in people whose symptoms have decreased. These exercises may be supervised by a physical therapist that is trained to use exercises to treat physical impairments. The exercises can also be supervised by an occupational therapist. This is someone who is trained in evaluating people with physical impairments. They will help them build skills to improve their health and well-being.   Alternative therapies - Acupuncture and chiropractic care have helped some patients. Their effectiveness remains unproved. An exception is yoga, which has been  shown to reduce pain and improve grip strength among patients with carpal tunnel syndrome.  SURGERY Carpal tunnel release is a surgical procedure commonly used. It is generally recommended if symptoms last for 6 months or more. This surgery involves cutting the band of tissue around the wrist to reduce pressure on the median nerve. Surgery is done under local anesthesia. It does not require an overnight hospital stay. Many patients require surgery on both hands. The following are types of carpal tunnel release surgery:  Open release surgery. This is the traditional procedure used to correct carpal tunnel syndrome. It consists of making a cut (incision) up to 2 inches in the wrist. Then, the surgeon cuts the carpal ligament to enlarge the carpal tunnel.  The procedure is generally done under local anesthesia on an outpatient basis. The only exception is if there are unusual medical considerations.   Endoscopic surgery may allow faster recovery and less postoperative discomfort than traditional open release surgery. The surgeon makes two incisions (about 1/2" each) in the wrist and palm. The surgeon will:   Insert a camera attached to a tube.   Observe the tissue on a screen.   Cut the carpal ligament (the tissue that holds joints together).  This two-portal endoscopic surgery, generally performed under local anesthesia, is effective and minimizes scarring and scar tenderness. One-portal endoscopic surgery for carpal tunnel syndrome is also available. Although symptoms may be relieved immediately after surgery, full recovery from carpal tunnel surgery can take months. Some patients may have:  Infection.   Nerve damage.   Stiffness.   Pain at the scar.  Occasionally, the wrist loses strength because the carpal ligament is cut. Patients should undergo physical therapy after surgery to restore wrist strength. Some patients may need to adjust job duties or even change jobs after recovery from  surgery. Recurrence of carpal tunnel syndrome following treatment is rare. The majority of patients recover completely. PREVENTION  At the workplace, workers can:   Do on-the-job conditioning, perform stretching exercises.   Take frequent rest breaks.   Wear splints to keep wrists straight.   Use correct posture and wrist position.   Wearing finger-less gloves can help keep hands warm and flexible.   Design workstations, tools and tool handles, and tasks to enable the worker's wrist to maintain a natural position.   Rotate jobs among workers.   Employers can develop programs in ergonomics. This is the process of adapting workplace conditions and job demands to the capabilities of workers. However, research has not conclusively shown that these workplace changes prevent the occurrence of carpal tunnel syndrome.  WHAT RESEARCH IS BEING DONE? The [employer] of Neurological Disorders and Stroke (NINDS), a part of the [employer], is the federal government's leading supporter of research on carpal tunnel syndrome. Scientists are studying the order of events that occur with carpal tunnel syndrome. They do this to better understand, treat, and prevent this ailment.  WHAT IS PERCUTANEOUS BALLOON CARPAL TUNNEL-PLASTY? Percutaneous balloon carpal tunnel-plasty is an experimental technique that can ease carpal tunnel pain without cutting the carpal ligament. In this procedure, a 1/4 -inch cut is made at the base of the palm. The caregiver then inserts a balloon through a catheter under the carpal ligament and inflates the balloon to stretch the ligament and free the nerve. Patients in one small study of this procedure reported relief of symptoms with no complications after the surgery. Most of them were back to work within 2 weeks. This experimental technique is not yet widely available. FOR MORE INFORMATION American Academy of Orthopaedic Surgeons: www.aaos.org  Centers for  Disease Control and Prevention (CDCP): FootballExhibition.com.br [employer] of Arthritis and Musculoskeletal and Skin Diseases (NIAMS): www.niams.http://www.myers.net/ American Chronic Pain Association (ACPA): www.theacpa.org National Chronic Pain Outreach Association (NCPOA): www.chronicpain.org Occupational Safety & Health Administration: ArmyDictionary.fi Document Released: 12/10/2003 Document Revised: 01/07/2011 Document Reviewed: 12/14/2007 [facility] Patient Information 2012 [place], [place].Medial Epicondylitis (Golfer's Elbow) with Rehab Medial epicondylitis involves inflammation and pain around the inner (medial) portion of the elbow. This pain is caused by inflammation of the tendons in the forearm that flex (bring down) the wrist. Medial epicondylitis is also called golfer's elbow, because it is common among golfers. However, it may occur in any  individual who flexes the wrist regularly. If medial epicondylitis is left untreated, it may become a chronic problem. SYMPTOMS   Pain, tenderness, or inflammation over the inner (medial) side of the elbow.  Pain or weakness with gripping activities.  Pain that increases with wrist twisting motions (using a screwdriver, playing golf, bowling). CAUSES  Medial epicondylitis is caused by inflammation of the tendons that flex the wrist. Causes of injury may include:  Chronic, repetitive stress and strain to the tendons that run from the wrist and forearm to the elbow.  Sudden strain on the forearm, including wrist snap when serving balls with racquet sports, or throwing a baseball. RISK INCREASES WITH:  Sports or occupations that require repetitive and/or strenuous forearm and wrist movements (pitching a baseball, golfing, carpentry).  Poor wrist and forearm strength and flexibility.  Failure to warm up properly before activity.  Resuming activity before healing, rehabilitation, and conditioning are complete. PREVENTION   Warm up and stretch properly before  activity.  Maintain physical fitness:  Strength, flexibility, and endurance.  Cardiovascular fitness.  Wear and use properly fitted equipment.  Learn and use proper technique and have a coach correct improper technique.  Wear a tennis elbow (counterforce) brace. PROGNOSIS  The course of this condition depends on the degree of the injury. If treated properly, acute cases (symptoms lasting less than 4 weeks) are often resolved in 2 to 6 weeks. Chronic (longer lasting cases) often resolve in 3 to 6 months, but may require physical therapy. RELATED COMPLICATIONS   Frequently recurring symptoms, resulting in a chronic problem. Properly treating the problem the first time decreases frequency of recurrence.  Chronic inflammation, scarring, and partial tendon tear, requiring surgery.  Delayed healing or resolution of symptoms. TREATMENT  Treatment first involves the use of ice and medicine, to reduce pain and inflammation. Strengthening and stretching exercises may reduce discomfort, if performed regularly. These exercises may be performed at home, if the condition is an acute injury. Chronic cases may require a referral to a physical therapist for evaluation and treatment. Your caregiver may advise a corticosteroid injection to help reduce inflammation. Rarely, surgery is needed. MEDICATION  If pain medicine is needed, nonsteroidal anti-inflammatory medicines (aspirin and ibuprofen), or other minor pain relievers (acetaminophen), are often advised.  Do not take pain medicine for 7 days before surgery.  Prescription pain relievers may be given, if your caregiver thinks they are needed. Use only as directed and only as much as you need.  Corticosteroid injections may be recommended. These injections should be reserved only for the most severe cases, because they can only be given a certain number of times. HEAT AND COLD  Cold treatment (icing) should be applied for 10 to 15 minutes every 2  to 3 hours for inflammation and pain, and immediately after activity that aggravates your symptoms. Use ice packs or an ice massage.  Heat treatment may be used before performing stretching and strengthening activities prescribed by your caregiver, physical therapist, or athletic trainer. Use a heat pack or a warm water soak. SEEK MEDICAL CARE IF: Symptoms get worse or do not improve in 2 weeks, despite treatment. EXERCISES  RANGE OF MOTION (ROM) AND STRETCHING EXERCISES - Epicondylitis, Medial (Golfer's Elbow) These exercises may help you when beginning to rehabilitate your injury. Your symptoms may go away with or without further involvement from your physician, physical therapist or athletic trainer. While completing these exercises, remember:   Restoring tissue flexibility helps normal motion to return to the joints.  This allows healthier, less painful movement and activity.  An effective stretch should be held for at least 30 seconds.  A stretch should never be painful. You should only feel a gentle lengthening or release in the stretched tissue. RANGE OF MOTION  Wrist Flexion, Active-Assisted  Extend your right / left elbow with your fingers pointing down.*  Gently pull the back of your hand towards you, until you feel a gentle stretch on the top of your forearm.  Hold this position for __________ seconds. Repeat __________ times. Complete this exercise __________ times per day.  *If directed by your physician, physical therapist or athletic trainer, complete this stretch with your elbow bent, rather than extended. RANGE OF MOTION  Wrist Extension, Active-Assisted  Extend your right / left elbow and turn your palm upwards.*  Gently pull your palm and fingertips back, so your wrist extends and your fingers point more toward the ground.  You should feel a gentle stretch on the inside of your forearm.  Hold this position for __________ seconds. Repeat __________ times. Complete  this exercise __________ times per day. *If directed by your physician, physical therapist or athletic trainer, complete this stretch with your elbow bent, rather than extended. STRETCH  Wrist Extension   Place your right / left fingertips on a tabletop leaving your elbow slightly bent. Your fingers should point backwards.  Gently press your fingers and palm down onto the table, by straightening your elbow. You should feel a stretch on the inside of your forearm.  Hold this position for __________ seconds. Repeat __________ times. Complete this stretch __________ times per day.  STRENGTHENING EXERCISES - Epicondylitis, Medial (Golfer's Elbow) These exercises may help you when beginning to rehabilitate your injury. They may resolve your symptoms with or without further involvement from your physician, physical therapist or athletic trainer. While completing these exercises, remember:   Muscles can gain both the endurance and the strength needed for everyday activities through controlled exercises.  Complete these exercises as instructed by your physician, physical therapist or athletic trainer. Increase the resistance and repetitions only as guided.  You may experience muscle soreness or fatigue, but the pain or discomfort you are trying to eliminate should never worsen during these exercises. If this pain does get worse, stop and make sure you are following the directions exactly. If the pain is still present after adjustments, discontinue the exercise until you can discuss the trouble with your caregiver. STRENGTH Wrist Flexors  Sit with your right / left forearm palm-up, and fully supported on a table or countertop. Your elbow should be resting below the height of your shoulder. Allow your wrist to extend over the edge of the surface.  Loosely holding a __________ weight, or a piece of rubber exercise band or tubing, slowly curl your hand up toward your forearm.  Hold this position for  __________ seconds. Slowly lower the wrist back to the starting position in a controlled manner. Repeat __________ times. Complete this exercise __________ times per day.  STRENGTH  Wrist Extensors  Sit with your right / left forearm palm-down and fully supported. Your elbow should be resting below the height of your shoulder. Allow your wrist to extend over the edge of the surface.  Loosely holding a __________ weight, or a piece of rubber exercise band or tubing, slowly curl your hand up toward your forearm.  Hold this position for __________ seconds. Slowly lower the wrist back to the starting position in a controlled manner. Repeat __________ times.  Complete this exercise __________ times per day.  STRENGTH - Ulnar Deviators  Stand with a ____________________ weight in your right / left hand, or sit while holding a rubber exercise band or tubing, with your healthy arm supported on a table or countertop.  Move your wrist so that your pinkie travels toward your forearm and your thumb moves away from your forearm.  Hold this position for __________ seconds and then slowly lower the wrist back to the starting position. Repeat __________ times. Complete this exercise __________ times per day STRENGTH - Grip   Grasp a tennis ball, a dense sponge, or a large, rolled sock in your hand.  Squeeze as hard as you can, without increasing any pain.  Hold this position for __________ seconds. Release your grip slowly. Repeat __________ times. Complete this exercise __________ times per day.  STRENGTH  Forearm Supinators   Sit with your right / left forearm supported on a table, keeping your elbow below shoulder height. Rest your hand over the edge, palm down.  Gently grip a hammer or a soup ladle.  Without moving your elbow, slowly turn your palm and hand upward to a "thumbs-up" position.  Hold this position for __________ seconds. Slowly return to the starting position. Repeat __________  times. Complete this exercise __________ times per day.  STRENGTH  Forearm Pronators  Sit with your right / left forearm supported on a table, keeping your elbow below shoulder height. Rest your hand over the edge, palm up.  Gently grip a hammer or a soup ladle.  Without moving your elbow, slowly turn your palm and hand upward to a "thumbs-up" position.  Hold this position for __________ seconds. Slowly return to the starting position. Repeat __________ times. Complete this exercise __________ times per day.  Document Released: 04/27/2005 Document Revised: 07/20/2011 Document Reviewed: 08/09/2008 [facility] Patient Information 2013 [place], [place].

## 2012-08-02 NOTE — Assessment & Plan Note (Signed)
To recheck direct LDL and likely increase Lipitor to 40mg /day after lab results back.

## 2012-08-02 NOTE — Assessment & Plan Note (Signed)
Elevated on prior lab check.  To recheck today, decision about further workup dependent on today's labs.

## 2012-08-02 NOTE — Progress Notes (Signed)
  Subjective:    Patient ID: Jamie Pennington, female    DOB: [birth year], 57 y.o.   MRN: [id number]  HPI Seen today for SDA appointment, for complaint of pain in the left wrist and left elbow which started on 3/21.  No trauma, no fall, no change in activity.  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  No new physical activity or change in use of her hands.  No prior injury.   Of note, seen here about 1 month ago and had labs done, which show her to have elevated LDL (150s) and a single (mildly) elevated transaminase.  Is taking Lipitor 20mg  daily, which she reports having good compliance.    Review of Systems See above.  Prior viral hepatitis (Hep A) as a teenager.  No other risk factors for other viral hepatitis.    Objective:   Physical Exam Well appearing, no apparent distress. HEENT Neck supple.  MSK: Full active and passive ROM in L wrist and elbow.  Sensation is full and symmetric in all fingers (L and R) and brisk cap refill in both hands.  Palpable radial pulses bilat.  POSITIVE PHALENS and TINNELS SIGNS on LEFT.  Tenderness over the medial epicondyle on the L elbow.        Assessment & Plan:

## 2012-08-02 NOTE — Assessment & Plan Note (Signed)
Patient with new medial epicondylitis of left elbow; for immobilization at nighttime, discussed measures to prevent compression of ulnar nerve.  Handout given today.

## 2012-08-03 ENCOUNTER — Encounter: Payer: Self-pay | Admitting: Family Medicine

## 2012-10-24 ENCOUNTER — Other Ambulatory Visit (HOSPITAL_COMMUNITY): Payer: Self-pay | Admitting: Obstetrics & Gynecology

## 2012-10-24 DIAGNOSIS — Z1231 Encounter for screening mammogram for malignant neoplasm of breast: Secondary | ICD-10-CM

## 2012-10-26 ENCOUNTER — Ambulatory Visit (INDEPENDENT_AMBULATORY_CARE_PROVIDER_SITE_OTHER): Payer: Managed Care, Other (non HMO) | Admitting: Podiatry

## 2012-10-26 ENCOUNTER — Encounter: Payer: Self-pay | Admitting: Podiatry

## 2012-10-26 VITALS — BP 132/80 | HR 58

## 2012-10-26 DIAGNOSIS — M722 Plantar fascial fibromatosis: Secondary | ICD-10-CM

## 2012-10-26 NOTE — Progress Notes (Signed)
Subjective: 57 year old female presents complaining of Right heel pain duration of 2 weeks, very bad and getting worse. Whole plantar medial surface hurts. Pain is when she wakes up early morning, and late evening when sit down and try to back up. 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  She was seen at this office for the same problem in 2011. She was treated with Flector Patch.  Review of Systems - General ROS: negative for - chills, fatigue, fever, night sweats, sleep disturbance, weight gain or weight loss Ophthalmic ROS: negative ENT ROS: negative Breast ROS: negative for breast lumps Respiratory ROS: no cough, shortness of breath, or wheezing Cardiovascular ROS: no chest pain or dyspnea on exertion Gastrointestinal ROS: no abdominal pain, change in bowel habits, or black or bloody stools Musculoskeletal ROS: negative Neurological ROS: negative Dermatological ROS: negative  Objective: Vascular:  All pedal pulses are palpable. No edema or erythema noted. Dermatologic: Dry scaly skin on both feet. No open wound or skin lesions. Neurologic: All epicritic and tactile sensations grossly intact. Orthopedic: Cavus type foot, bilateral. Hypermobile first ray, bilateral. Right heel pain. Compensated rearfoot varus bilateral. Review of X-ray: Cavus type high arched foot bilateral. Positive of osteophytic bone formation at the first Metatarsocuneiform joint bilateral. Positive of heel spur bilateral. Elevated first ray bilateral.  Assessment: 1. Chronic, recurrent plantar fasciitis right. 2. Hypermobile first ray bilateral. 3. Compensated rearfoot varus bilateral.  Plan: Reviewed clinical findings and available treatment option, such as injection, orthotics, patches, NSAIA, pain medications, and surgical options (Lapidus fusion, Plantar fasciotomy).  Patient does not want to have injection yet. But wants to try Plantar fasciotomy first. If it does not work, she  will try the next procedure, Lapidus fusion.

## 2012-10-26 NOTE — Patient Instructions (Addendum)
Seen for pain on right heel, Plantar fasciitis right. Decisions made by patient to take a surgical route, Plantar fasciotomy right foot.  Following the surgery, the right lower limb will be placed in a posterior splint. Please keep it clean and dry till the next appointment. Patient will contact us for surgery date.

## 2012-10-28 ENCOUNTER — Ambulatory Visit (HOSPITAL_COMMUNITY)
Admission: RE | Admit: 2012-10-28 | Discharge: 2012-10-28 | Disposition: A | Discharge status: Home / Self Care | Payer: Managed Care, Other (non HMO) | Source: Ambulatory Visit | Attending: Obstetrics & Gynecology | Admitting: Obstetrics & Gynecology

## 2012-10-28 DIAGNOSIS — Z1231 Encounter for screening mammogram for malignant neoplasm of breast: Secondary | ICD-10-CM

## 2012-10-31 ENCOUNTER — Other Ambulatory Visit: Payer: Self-pay | Admitting: Family Medicine

## 2012-11-23 ENCOUNTER — Ambulatory Visit (INDEPENDENT_AMBULATORY_CARE_PROVIDER_SITE_OTHER): Payer: Managed Care, Other (non HMO) | Admitting: Podiatry

## 2012-11-23 ENCOUNTER — Encounter: Payer: Self-pay | Admitting: Podiatry

## 2012-11-23 VITALS — BP 158/97 | HR 66 | Ht 63.0 in | Wt 160.0 lb

## 2012-11-23 DIAGNOSIS — M722 Plantar fascial fibromatosis: Secondary | ICD-10-CM | POA: Insufficient documentation

## 2012-11-23 DIAGNOSIS — M21961 Unspecified acquired deformity of right lower leg: Secondary | ICD-10-CM

## 2012-11-23 DIAGNOSIS — M21969 Unspecified acquired deformity of unspecified lower leg: Secondary | ICD-10-CM | POA: Insufficient documentation

## 2012-11-23 NOTE — Patient Instructions (Addendum)
Seen for chronic heel pain right. We have discussed having Cotton osteotomy with graft and EPF on right.  Follow the surgery the right foot and leg would be placed in a cast for total 6 weeks and followed by CAM walker.

## 2012-11-24 DIAGNOSIS — Q664 Congenital talipes calcaneovalgus, unspecified foot: Secondary | ICD-10-CM

## 2012-11-24 DIAGNOSIS — M722 Plantar fascial fibromatosis: Secondary | ICD-10-CM

## 2012-11-24 HISTORY — PX: OTHER SURGICAL HISTORY: SHX169

## 2012-11-28 ENCOUNTER — Encounter: Payer: Self-pay | Admitting: Podiatry

## 2012-11-29 ENCOUNTER — Other Ambulatory Visit: Payer: Self-pay | Admitting: Family Medicine

## 2012-12-01 ENCOUNTER — Ambulatory Visit (INDEPENDENT_AMBULATORY_CARE_PROVIDER_SITE_OTHER): Payer: Managed Care, Other (non HMO) | Admitting: Podiatry

## 2012-12-01 VITALS — BP 176/81 | HR 66

## 2012-12-01 DIAGNOSIS — M21969 Unspecified acquired deformity of unspecified lower leg: Secondary | ICD-10-CM

## 2012-12-01 DIAGNOSIS — M722 Plantar fascial fibromatosis: Secondary | ICD-10-CM

## 2012-12-01 DIAGNOSIS — M21961 Unspecified acquired deformity of right lower leg: Secondary | ICD-10-CM

## 2012-12-06 NOTE — Progress Notes (Signed)
Patient was seen for the first post op visit. Patient is wearing Fiberglass cast on right. Denies any discomfort. No problems with the cast. Will change cast in 2 weeks. Patient understands total 6 weeks she will be in cast.

## 2012-12-06 NOTE — Progress Notes (Signed)
  Subjective:  57 year old female presents complaining of Right heel pain that has been going on off and on for sometime. At this time patient is seeking surgical intervention.  Review of Systems - General ROS: negative for - chills, fatigue, fever, night sweats, sleep disturbance, weight gain or weight loss  Ophthalmic ROS: negative  ENT ROS: negative  Breast ROS: negative for breast lumps  Respiratory ROS: no cough, shortness of breath, or wheezing  Cardiovascular ROS: no chest pain or dyspnea on exertion  Gastrointestinal ROS: no abdominal pain, change in bowel habits, or black or bloody stools  Musculoskeletal ROS: negative  Neurological ROS: negative  Dermatological ROS: negative   Objective: No new changes. Vascular:  All pedal pulses are palpable.  No edema or erythema noted.  Dermatologic: Dry scaly skin on both feet. No open wound or skin lesions.  Neurologic: All epicritic and tactile sensations grossly intact.  Orthopedic:  Cavus type foot, bilateral.  Hypermobile first ray, bilateral.  Right heel pain.  Compensated rearfoot varus bilateral.  Review of X-ray: Cavus type high arched foot bilateral. Positive of osteophytic bone formation at the first Metatarsocuneiform joint bilateral. Positive of heel spur bilateral. Elevated first ray bilateral.   Assessment:  1. Chronic, recurrent plantar fasciitis right.  2. Hypermobile first ray bilateral.  3. Compensated rearfoot varus bilateral.   Plan: Reviewed clinical findings and available treatment option, such as injection, orthotics, patches, NSAIA, pain medications, and surgical options (Cotton osteotomy with bone graft, Plantar fasciotomy).  Patient wants to have osseous procedure as well as Plantar fasciotomy at this time to get over with at once.  Discussed Cotton osteotomy instead of Lapidus fusion due to short bone structure.

## 2012-12-19 ENCOUNTER — Ambulatory Visit (INDEPENDENT_AMBULATORY_CARE_PROVIDER_SITE_OTHER): Payer: Managed Care, Other (non HMO) | Admitting: Podiatry

## 2012-12-19 DIAGNOSIS — M25569 Pain in unspecified knee: Secondary | ICD-10-CM

## 2012-12-19 DIAGNOSIS — M25561 Pain in right knee: Secondary | ICD-10-CM

## 2012-12-19 DIAGNOSIS — M722 Plantar fascial fibromatosis: Secondary | ICD-10-CM

## 2012-12-19 DIAGNOSIS — M21969 Unspecified acquired deformity of unspecified lower leg: Secondary | ICD-10-CM

## 2012-12-19 DIAGNOSIS — M21961 Unspecified acquired deformity of right lower leg: Secondary | ICD-10-CM

## 2012-12-19 NOTE — Progress Notes (Signed)
2 and a half week following EPF and Cotton osteotomy with bone graft right. Patient came in casted foot using crutches. Patient has no complaints. Cast is in good shape.  Radiographic examination reveal good bone graft position. Good forefoot alignment right. Cast removed. Right lower limb cleansed with soap and water.  Sutures removed. BK fiberglass cast replaced and added cast boot. Continue with semi weight bearing walk.  Return in 3 weeks.

## 2013-01-06 ENCOUNTER — Ambulatory Visit (INDEPENDENT_AMBULATORY_CARE_PROVIDER_SITE_OTHER): Payer: Managed Care, Other (non HMO) | Admitting: Podiatry

## 2013-01-06 DIAGNOSIS — Z9889 Other specified postprocedural states: Secondary | ICD-10-CM

## 2013-01-06 NOTE — Progress Notes (Signed)
6 weeks post op since TAL and Cotton osteotomy with graft right foot. Stated that she is doing fine. Cast removed. Minimum edema on forefoot. Surgical wound has healed well. No abnormal findings. Right foot cleansed and placed in CAM walker.  Patient is to walk in CAM walker for the next 2 weeks, then try regular tennis shoes.

## 2013-01-10 ENCOUNTER — Encounter: Payer: Managed Care, Other (non HMO) | Admitting: Podiatry

## 2013-01-12 ENCOUNTER — Encounter: Payer: Self-pay | Admitting: Internal Medicine

## 2013-01-12 ENCOUNTER — Ambulatory Visit (INDEPENDENT_AMBULATORY_CARE_PROVIDER_SITE_OTHER): Payer: Managed Care, Other (non HMO) | Admitting: Internal Medicine

## 2013-01-12 VITALS — BP 156/94 | HR 78 | Ht 63.0 in | Wt 169.0 lb

## 2013-01-12 DIAGNOSIS — E785 Hyperlipidemia, unspecified: Secondary | ICD-10-CM

## 2013-01-12 DIAGNOSIS — I10 Essential (primary) hypertension: Secondary | ICD-10-CM

## 2013-01-12 DIAGNOSIS — I2510 Atherosclerotic heart disease of native coronary artery without angina pectoris: Secondary | ICD-10-CM

## 2013-01-12 MED ORDER — RAMIPRIL 5 MG PO CAPS: 5.0000 mg | ORAL_CAPSULE | Freq: Every day | ORAL | Status: DC

## 2013-01-12 MED ORDER — AMLODIPINE BESYLATE 5 MG PO TABS: 5.0000 mg | ORAL_TABLET | Freq: Every day | ORAL | Status: DC

## 2013-01-12 MED ORDER — ATORVASTATIN CALCIUM 40 MG PO TABS: 40.0000 mg | ORAL_TABLET | Freq: Every day | ORAL | Status: DC

## 2013-01-12 NOTE — Progress Notes (Signed)
HPI Patient is a 57 year old who has been followed by S. Tennant. Jamie Pennington has a history of CAD, s/p IWMI with stent to the RCA in 2004. Residual CAD to the LCx. I saw the patient in 2012. Activity limited due to orthopedic issues Denies CP  No SOB  No  Note that patient has not been on Zetia No Known Allergies  Current Outpatient Prescriptions  Medication Sig Dispense Refill  . Ascorbic Acid (VITAMIN C) 1000 MG tablet Take 1,000 mg by mouth daily.        Marland Kitchen aspirin 81 MG tablet Take 81 mg by mouth daily.      Marland Kitchen atorvastatin (LIPITOR) 20 MG tablet Take 1 tablet (20 mg total) by mouth daily.  30 tablet  6  . calcium-vitamin D (OSCAL WITH D 500-200) 500-200 MG-UNIT per tablet Take 1 tablet by mouth 2 (two) times daily.        Marland Kitchen loratadine (CLARITIN) 10 MG tablet Take 10 mg by mouth daily. As needed      . metoprolol tartrate (LOPRESSOR) 25 MG tablet TAKE 1 TABLET BY MOUTH TWICE DAILY  60 tablet  2  . Multiple Vitamin (MULTIVITAMIN) tablet Take 1 tablet by mouth daily.        . nitroGLYCERIN (NITROSTAT) 0.4 MG SL tablet Place 1 tablet (0.4 mg total) under the tongue every 5 (five) minutes as needed.  25 tablet  3  . ramipril (ALTACE) 2.5 MG capsule Take 1 capsule (2.5 mg total) by mouth daily.  30 capsule  6  . vitamin E 400 UNIT capsule Take 400 Units by mouth daily.         No current facility-administered medications for this visit.    Past Medical History  Diagnosis Date  . Myocardial infarct     hx of . s/p stent placement in 2004  . Hypertension   . Hyperlipidemia   . Coronary artery disease     Past Surgical History  Procedure Laterality Date  . Knee surgery  2010    left  . Cardiac catheterization  2004  . Angioplasty      with stent  placement to the right coronary.  . Breast biopsy      right  . Cotton osteotomy with bone graft Right 11/24/2012    @ [facility]  . Plantar endoscopic fasciotomy Right 11/24/2012    @ [facility]    Family History  Problem Relation Age of Onset  .  Stroke Father 17    died  . Alzheimer's disease Mother 80    died  . Heart attack Sister     and bypass surgery    History   Social History  . Marital Status: Legally Separated    Spouse Name: N/A    Number of Children: N/A  . Years of Education: N/A   Occupational History  . Not on file.   Social History Main Topics  . Smoking status: Former Smoker -- 1.00 packs/day for 20 years    Quit date: 01/18/1998  . Smokeless tobacco: Never Used  . Alcohol Use: No  . Drug Use: No  . Sexual Activity: Not on file   Other Topics Concern  . Not on file   Social History Narrative  . No narrative on file    Review of Systems:  All systems reviewed.  They are negative to the above problem except as previously stated.  Vital Signs: BP 156/94  Pulse 78  Ht 5\' 3"  (1.6 m)  Wt 169 lb (76.658 kg)  BMI 29.94 kg/m2  SpO2 99%  Physical Exam  HEENT:  Normocephalic, atraumatic. EOMI, PERRLA.  Neck: JVP is normal.  No bruits.  Lungs: clear to auscultation. No rales no wheezes.  Heart: Regular rate and rhythm. Normal S1, S2. No S3.   No significant murmurs. PMI not displaced.  Abdomen:  Supple, nontender. Normal bowel sounds. No masses. No hepatomegaly.  Extremities:   Good distal pulses throughout. No lower extremity edema.  Musculoskeletal :moving all extremities.  Neuro:   alert and oriented x3.  CN II-XII grossly intact.  EKG  SR 78 bpm    Assessment and Plan:  1.  CAD  No symptoms to sugg angina.  Continue medical Rx  2.  HTN  NOt optimally controlled  Will increase Altace to 5  Add amlodipine 5  COntinue other meds  F?U in 8 wks  3.  HL  Patient admits to not following diet.  Not active due to orhto issues Recomm increase to 40 mg  F/u in 8 wks  Watch diet.   Note Jamie Pennington is not on Zetia but that is large jump in LDL   May need Crestor.

## 2013-01-12 NOTE — Patient Instructions (Addendum)
Medication changes:  Increase Liptor to 40mg  each evening                                                                 Increase: Altace to 5 mg daily                                                                  START: Norvasc  5 mg daily  Your physician recommends that you schedule a follow-up appointment in:  8 weeks with Dr. Tenny Craw and have fasting cholesterol level drawn

## 2013-01-26 ENCOUNTER — Other Ambulatory Visit: Payer: Self-pay | Admitting: Family Medicine

## 2013-01-27 ENCOUNTER — Ambulatory Visit (INDEPENDENT_AMBULATORY_CARE_PROVIDER_SITE_OTHER): Payer: Managed Care, Other (non HMO) | Admitting: Podiatry

## 2013-01-27 ENCOUNTER — Encounter: Payer: Self-pay | Admitting: Podiatry

## 2013-01-27 DIAGNOSIS — R609 Edema, unspecified: Secondary | ICD-10-CM

## 2013-01-27 DIAGNOSIS — M21969 Unspecified acquired deformity of unspecified lower leg: Secondary | ICD-10-CM

## 2013-01-27 DIAGNOSIS — Z9889 Other specified postprocedural states: Secondary | ICD-10-CM

## 2013-01-27 DIAGNOSIS — M21961 Unspecified acquired deformity of right lower leg: Secondary | ICD-10-CM

## 2013-01-27 NOTE — Progress Notes (Signed)
2 months status post right foot Cotton osteotomy with bone graft.  Patient walks in CAM walker. She has been back to work. At work she walks and sits.  Objective: Right foot and ankle has more swelling than what is expected. Pain with manipulation of mid foot and rear foot.  Assessment: Pain and swelling is present more than normal.  This is expected for the first 2-4 months if a person returned back to work whether stands or sit.  X-ray findings indicate good bone healing and graft is taking well.  Plan: Continue to use CAM walker or may use ankle brace in lace up shoe. Use compression socks. Will follow up in 1 month.

## 2013-01-27 NOTE — Patient Instructions (Addendum)
2 months following right foot surgery. Pain and swelling is present more than normal.  This is expected for the first 2-4 months if a person returned back to work whether stands or sit.  X-ray findings indicate good bone healing and graft is taking well. Continue to use CAM walker or may use ankle brace in lace up shoe. Will follow up in 1 month.

## 2013-02-03 ENCOUNTER — Other Ambulatory Visit: Payer: Self-pay | Admitting: Gastroenterology

## 2013-02-28 ENCOUNTER — Other Ambulatory Visit: Payer: Self-pay | Admitting: Family Medicine

## 2013-03-01 ENCOUNTER — Encounter: Payer: Self-pay | Admitting: Podiatry

## 2013-03-01 ENCOUNTER — Ambulatory Visit (INDEPENDENT_AMBULATORY_CARE_PROVIDER_SITE_OTHER): Payer: Managed Care, Other (non HMO) | Admitting: Podiatry

## 2013-03-01 VITALS — BP 137/72 | HR 75

## 2013-03-01 DIAGNOSIS — Z9889 Other specified postprocedural states: Secondary | ICD-10-CM

## 2013-03-01 NOTE — Progress Notes (Signed)
Tender on top when it is hit.  With the surgery she is able to tell the foot is stronger.  Assessment: Satisfactory post op progress.  Has recovered and resumed her regular activity. Plan: Will continue with Ankle brace till the new orthotics are ready.

## 2013-03-01 NOTE — Patient Instructions (Signed)
3 month post op doing well following bone graft and tendon lengthening. Will prepare orthotics pending more information from insurance.  Otherwise, return as needed.

## 2013-03-16 ENCOUNTER — Ambulatory Visit (INDEPENDENT_AMBULATORY_CARE_PROVIDER_SITE_OTHER): Payer: Managed Care, Other (non HMO) | Admitting: Internal Medicine

## 2013-03-16 ENCOUNTER — Encounter: Payer: Self-pay | Admitting: Internal Medicine

## 2013-03-16 VITALS — BP 132/92 | HR 77 | Ht 63.0 in | Wt 177.0 lb

## 2013-03-16 DIAGNOSIS — E785 Hyperlipidemia, unspecified: Secondary | ICD-10-CM

## 2013-03-16 DIAGNOSIS — I2510 Atherosclerotic heart disease of native coronary artery without angina pectoris: Secondary | ICD-10-CM

## 2013-03-16 LAB — BASIC METABOLIC PANEL
BUN: 17 mg/dL (ref 6–23)
CO2: 25 mEq/L (ref 19–32)
Calcium: 9 mg/dL (ref 8.4–10.5)
Chloride: 104 mEq/L (ref 96–112)
Creatinine, Ser: 0.6 mg/dL (ref 0.4–1.2)
GFR: 106.83 mL/min (ref >60.00)
Glucose, Bld: 99 mg/dL (ref 70–99)
Potassium: 4.1 mEq/L (ref 3.5–5.1)
Sodium: 137 mEq/L (ref 135–145)

## 2013-03-16 LAB — LIPID PANEL
Cholesterol: 171 mg/dL (ref 0–200)
HDL: 47.8 mg/dL (ref >39.00)
LDL Cholesterol: 103 mg/dL — ABNORMAL HIGH (ref 0–99)
Triglycerides: 101 mg/dL (ref 0.0–149.0)
VLDL: 20.2 mg/dL (ref 0.0–40.0)

## 2013-03-16 LAB — HEPATIC FUNCTION PANEL
ALT: 34 U/L (ref 0–35)
Albumin: 3.9 g/dL (ref 3.5–5.2)
Alkaline Phosphatase: 88 U/L (ref 39–117)
Bilirubin, Direct: 0.1 mg/dL (ref 0.0–0.3)
Total Bilirubin: 0.7 mg/dL (ref 0.3–1.2)
Total Protein: 7.2 g/dL (ref 6.0–8.3)

## 2013-03-16 MED ORDER — LISINOPRIL-HYDROCHLOROTHIAZIDE 10-12.5 MG PO TABS: 1.0000 | ORAL_TABLET | Freq: Every day | ORAL | Status: DC

## 2013-03-16 NOTE — Patient Instructions (Signed)
When finished with Altace start Prinzide 10/12.5 mg daily   Lab work today   Your physician recommends that you schedule a follow-up appointment in: 3 months.

## 2013-03-16 NOTE — Progress Notes (Signed)
HPI Patient is a 57 year old who has been followed by S. Tennant. 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 Residual CAD to the LCx. I saw the patient in Sept.  At that time her BP was high  I increased altace and added amlodipine  Activity limited due to orthopedic issues Since seen no CP  Breathing OK  No cough No Known Allergies  Current Outpatient Prescriptions  Medication Sig Dispense Refill  . amLODipine (NORVASC) 5 MG tablet Take 1 tablet (5 mg total) by mouth daily.  30 tablet  6  . Ascorbic Acid (VITAMIN C) 1000 MG tablet Take 1,000 mg by mouth daily.        Marland Kitchen aspirin 81 MG tablet Take 81 mg by mouth daily.      Marland Kitchen atorvastatin (LIPITOR) 40 MG tablet Take 1 tablet (40 mg total) by mouth daily.  30 tablet  6  . calcium-vitamin D (OSCAL WITH D 500-200) 500-200 MG-UNIT per tablet Take 1 tablet by mouth 2 (two) times daily.        Marland Kitchen loratadine (CLARITIN) 10 MG tablet Take 10 mg by mouth daily. As needed      . metoprolol tartrate (LOPRESSOR) 25 MG tablet TAKE 1 TABLET BY MOUTH TWICE DAILY  60 tablet  0  . Multiple Vitamin (MULTIVITAMIN) tablet Take 1 tablet by mouth daily.        . nitroGLYCERIN (NITROSTAT) 0.4 MG SL tablet Place 1 tablet (0.4 mg total) under the tongue every 5 (five) minutes as needed.  25 tablet  3  . ramipril (ALTACE) 5 MG capsule Take 1 capsule (5 mg total) by mouth daily.  30 capsule  6  . vitamin E 400 UNIT capsule Take 400 Units by mouth daily.         No current facility-administered medications for this visit.    Past Medical History  Diagnosis Date  . Myocardial infarct     hx of . s/p stent placement in 2004  . Hypertension   . Hyperlipidemia   . Coronary artery disease     Past Surgical History  Procedure Laterality Date  . Knee surgery  2010    left  . Cardiac catheterization  2004  . Angioplasty      with stent  placement to the right coronary.  . Breast biopsy      right  . Cotton osteotomy with bone graft Right  11/24/2012    @ [facility]  . Plantar endoscopic fasciotomy Right 11/24/2012    @ [facility]    Family History  Problem Relation Age of Onset  . Stroke Father 76    died  . Alzheimer's disease Mother 29    died  . Heart attack Sister     and bypass surgery    History   Social History  . Marital Status: Legally Separated    Spouse Name: N/A    Number of Children: N/A  . Years of Education: N/A   Occupational History  . Not on file.   Social History Main Topics  . Smoking status: Former Smoker -- 1.00 packs/day for 20 years    Quit date: 01/18/1998  . Smokeless tobacco: Never Used  . Alcohol Use: No  . Drug Use: No  . Sexual Activity: Not on file   Other Topics Concern  . Not on file   Social History Narrative  . No narrative on file    Review of Systems:  All systems reviewed.  They are negative to the above problem except as previously stated.  Vital Signs: BP 132/92  Pulse 77  Ht 5\' 3"  (1.6 m)  Wt 177 lb (80.287 kg)  BMI 31.36 kg/m2 BP on my check 150/92    Physical Exam Patient is in NAD HEENT:  Normocephalic, atraumatic. EOMI, PERRLA.  Neck: JVP is normal.  No bruits.  Lungs: clear to auscultation. No rales no wheezes.  Heart: Regular rate and rhythm. Normal S1, S2. No S3.   No significant murmurs. PMI not displaced.  Abdomen:  Supple, nontender. Normal bowel sounds. No masses. No hepatomegaly.  Extremities:   Good distal pulses throughout. No lower extremity edema.  Musculoskeletal :moving all extremities.  Neuro:   alert and oriented x3.  CN II-XII grossly intact.  EKG  SR 78 bpm    Assessment and Plan:  1.  CAD  No symptoms to sugg angina.  Continue medical Rx  2.  HTN  Still not optimal  Would switch to Prinizide (10/12.5)  Continue amlodipine and lopressor   3.  HL  Patient admits to not following diet.  Not active due to orhto issues Check liids on 40 of lipitor with liver panel.

## 2013-04-01 ENCOUNTER — Other Ambulatory Visit: Payer: Self-pay | Admitting: Family Medicine

## 2013-04-04 ENCOUNTER — Encounter: Payer: Self-pay | Admitting: *Deleted

## 2013-04-14 ENCOUNTER — Other Ambulatory Visit: Payer: Self-pay

## 2013-04-14 MED ORDER — METOPROLOL TARTRATE 25 MG PO TABS: ORAL_TABLET | ORAL | Status: DC

## 2013-06-06 ENCOUNTER — Encounter: Payer: Self-pay | Admitting: Podiatry

## 2013-06-06 ENCOUNTER — Ambulatory Visit (INDEPENDENT_AMBULATORY_CARE_PROVIDER_SITE_OTHER): Payer: Managed Care, Other (non HMO) | Admitting: Podiatry

## 2013-06-06 VITALS — BP 138/75 | HR 66

## 2013-06-06 DIAGNOSIS — M21969 Unspecified acquired deformity of unspecified lower leg: Secondary | ICD-10-CM

## 2013-06-06 DIAGNOSIS — M25569 Pain in unspecified knee: Secondary | ICD-10-CM

## 2013-06-06 DIAGNOSIS — M722 Plantar fascial fibromatosis: Secondary | ICD-10-CM

## 2013-06-06 NOTE — Patient Instructions (Signed)
Both feet casted for orthotics. Status post right foot surgery.  Satisfactory progress.

## 2013-06-06 NOTE — Progress Notes (Signed)
Status post 6 months Cotton osteotomy right. Patient presents to have orthotics prepared. Still have some stiffness on right foot. Other than that, doing fine. Both feet casted for Orthotics.

## 2013-06-16 ENCOUNTER — Ambulatory Visit (INDEPENDENT_AMBULATORY_CARE_PROVIDER_SITE_OTHER): Payer: Managed Care, Other (non HMO) | Admitting: Internal Medicine

## 2013-06-16 ENCOUNTER — Encounter: Payer: Self-pay | Admitting: Internal Medicine

## 2013-06-16 VITALS — BP 138/57 | HR 68 | Ht 63.0 in | Wt 175.0 lb

## 2013-06-16 DIAGNOSIS — I10 Essential (primary) hypertension: Secondary | ICD-10-CM

## 2013-06-16 LAB — BASIC METABOLIC PANEL
BUN: 23 mg/dL (ref 6–23)
CHLORIDE: 104 meq/L (ref 96–112)
CO2: 25 mEq/L (ref 19–32)
CREATININE: 0.6 mg/dL (ref 0.4–1.2)
Calcium: 9.2 mg/dL (ref 8.4–10.5)
GFR: 106.74 mL/min (ref >60.00)
Glucose, Bld: 96 mg/dL (ref 70–99)
Potassium: 3.9 mEq/L (ref 3.5–5.1)
Sodium: 136 mEq/L (ref 135–145)

## 2013-06-16 NOTE — Progress Notes (Signed)
HPI Patient is a 58 year old who has been followed by S. Tennant. 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 Residual CAD to the LCx. I saw the patient in Sept.  At that time her BP was high  I increased altace and added amlodipine   In NOvember BP was still high  I switched her to prinizide. Since seen she says her BP has been better. Breathing is OK  Still limited by foot.    No Known Allergies  Current Outpatient Prescriptions  Medication Sig Dispense Refill  . amLODipine (NORVASC) 5 MG tablet Take 1 tablet (5 mg total) by mouth daily.  30 tablet  6  . Ascorbic Acid (VITAMIN C) 1000 MG tablet Take 1,000 mg by mouth daily.        Marland Kitchen. aspirin 81 MG tablet Take 81 mg by mouth daily.      Marland Kitchen. atorvastatin (LIPITOR) 40 MG tablet Take 1 tablet (40 mg total) by mouth daily.  30 tablet  6  . calcium-vitamin D (OSCAL WITH D 500-200) 500-200 MG-UNIT per tablet Take 1 tablet by mouth 2 (two) times daily.        Marland Kitchen. lisinopril-hydrochlorothiazide (ZESTORETIC) 10-12.5 MG per tablet Take 1 tablet by mouth daily.  30 tablet  6  . loratadine (CLARITIN) 10 MG tablet Take 10 mg by mouth daily. As needed      . metoprolol tartrate (LOPRESSOR) 25 MG tablet TAKE 1 TABLET BY MOUTH TWICE DAILY  60 tablet  6  . Multiple Vitamin (MULTIVITAMIN) tablet Take 1 tablet by mouth daily.        . nitroGLYCERIN (NITROSTAT) 0.4 MG SL tablet Place 1 tablet (0.4 mg total) under the tongue every 5 (five) minutes as needed.  25 tablet  3  . vitamin E 400 UNIT capsule Take 400 Units by mouth daily.         No current facility-administered medications for this visit.    Past Medical History  Diagnosis Date  . Myocardial infarct     hx of . s/p stent placement in 2004  . Hypertension   . Hyperlipidemia   . Coronary artery disease     Past Surgical History  Procedure Laterality Date  . Knee surgery  2010    left  . Cardiac catheterization  2004  . Angioplasty      with stent  placement to the  right coronary.  . Breast biopsy      right  . Cotton osteotomy with bone graft Right 11/24/2012    @ [facility]  . Plantar endoscopic fasciotomy Right 11/24/2012    @ [facility]    Family History  Problem Relation Age of Onset  . Stroke Father 2960    died  . Alzheimer's disease Mother 2890    died  . Heart attack Sister     and bypass surgery    History   Social History  . Marital Status: Legally Separated    Spouse Name: N/A    Number of Children: N/A  . Years of Education: N/A   Occupational History  . Not on file.   Social History Main Topics  . Smoking status: Former Smoker -- 1.00 packs/day for 20 years    Quit date: 01/18/1998  . Smokeless tobacco: Never Used  . Alcohol Use: No  . Drug Use: No  . Sexual Activity: Not on file   Other Topics Concern  . Not on file  Social History Narrative  . No narrative on file    Review of Systems:  All systems reviewed.  They are negative to the above problem except as previously stated.  Vital Signs: BP 138/57  Pulse 68  Ht 5\' 3"  (1.6 m)  Wt 175 lb (79.379 kg)  BMI 31.01 kg/m2 BP on my check 150/92    Physical Exam Patient is in NAD HEENT:  Normocephalic, atraumatic. EOMI, PERRLA.  Neck: JVP is normal.  No bruits.  Lungs: clear to auscultation. No rales no wheezes.  Heart: Regular rate and rhythm. Normal S1, S2. No S3.   No significant murmurs. PMI not displaced.  Abdomen:  Supple, nontender. Normal bowel sounds. No masses. No hepatomegaly.  Extremities:   Good distal pulses throughout. No lower extremity edema.  Musculoskeletal :moving all extremities.  Neuro:   alert and oriented x3.  CN II-XII grossly intact.  EKG  SR 68 bpm    Assessment and Plan:  1.  CAD  No symptoms to sugg angina.  Continue medical Rx  2.  HTN  BP is better  Keep on same regimen.    3.  HL LDL should be better.  102  Patient admitted to diet indiscretions.

## 2013-06-16 NOTE — Patient Instructions (Addendum)
Your physician recommends that you return for lab work today.  (BMET)  Your physician wants you to follow-up in: 1 year with DR. Tenny CrawOSS.   You will receive a reminder letter in the mail two months in advance. If you don't receive a letter, please call our office to schedule the follow-up appointment.  Continue your same medications as listed.

## 2013-06-28 ENCOUNTER — Encounter: Payer: Self-pay | Admitting: *Deleted

## 2013-07-14 ENCOUNTER — Encounter: Payer: Self-pay | Admitting: Podiatry

## 2013-07-14 ENCOUNTER — Ambulatory Visit (INDEPENDENT_AMBULATORY_CARE_PROVIDER_SITE_OTHER): Payer: Managed Care, Other (non HMO) | Admitting: Podiatry

## 2013-07-14 VITALS — BP 131/74 | HR 63

## 2013-07-14 DIAGNOSIS — Z9889 Other specified postprocedural states: Secondary | ICD-10-CM

## 2013-07-14 NOTE — Progress Notes (Signed)
On feet 8-4 hours a day at work.  Still some tenderness over dorsum when standing with closed in shoes.  Orthotics are helping some. It is more comfortable at this time. Wears all the time.  Still having discomfort at surgery site with pressure on.  Surgery was in July 2014 ( Cotton and TAL right) and grafted site is healed off with good alignment and position.  Ankle range of motion is also good.  Assessment: Improved foot condition with resolved old pain. Tolerating well with orthotics.  Plan: Reviewed findings. Return as needed.

## 2013-07-14 NOTE — Patient Instructions (Signed)
Continue with Orthotics. Return as needed.

## 2013-08-05 ENCOUNTER — Other Ambulatory Visit: Payer: Self-pay | Admitting: Internal Medicine

## 2013-09-30 ENCOUNTER — Other Ambulatory Visit: Payer: Self-pay | Admitting: Internal Medicine

## 2013-10-28 ENCOUNTER — Other Ambulatory Visit: Payer: Self-pay | Admitting: Internal Medicine

## 2014-01-27 ENCOUNTER — Other Ambulatory Visit: Payer: Self-pay | Admitting: Internal Medicine

## 2014-02-14 ENCOUNTER — Encounter: Payer: Self-pay | Admitting: Family Medicine

## 2014-02-14 ENCOUNTER — Ambulatory Visit (INDEPENDENT_AMBULATORY_CARE_PROVIDER_SITE_OTHER): Payer: Managed Care, Other (non HMO) | Admitting: Family Medicine

## 2014-02-14 VITALS — BP 140/73 | HR 100 | Temp 98.2°F | Wt 168.0 lb

## 2014-02-14 DIAGNOSIS — R05 Cough: Secondary | ICD-10-CM

## 2014-02-14 DIAGNOSIS — R059 Cough, unspecified: Secondary | ICD-10-CM

## 2014-02-14 DIAGNOSIS — R490 Dysphonia: Secondary | ICD-10-CM

## 2014-02-14 MED ORDER — PREDNISONE 50 MG PO TABS: 50.0000 mg | ORAL_TABLET | Freq: Every day | ORAL | Status: DC

## 2014-02-14 NOTE — Progress Notes (Signed)
Patient ID: Jamie Pennington, female   DOB: [birth year], 58 y.o.   MRN: [id number]  HPI:  Pt presents for a same day appointment to discuss cough.  Has had cough for about 10 days. Started last Monday. Throat feels dry. Has coughed frequently to the ponit of vomiting, has also had some stress incontinence of urine with coughing spells. Still feels congested in her nose. Has headaches in her forehead. Had "fever" last week, but was never above 100. Went to [facility] a week ago and was given rx for tessalon, flonase, albuterol. Using inhaler about 3-4 times per day, also flonase daily. These have helped some and she feels a little better, but still has no voice and cough has persisted. Has pain in L groin with coughing, thinks may have pulled a muscle. Feels mildly SOB with exertion, also just generally weak even though overall feeling better.   ROS: See HPI  PMFSH: nonsmoker, quit in 2000. Hx CAD s/p stenting, HTN, HLD.  PHYSICAL EXAM: BP 140/73  Pulse 100  Temp(Src) 98.2 F (36.8 C) (Oral)  Wt 168 lb (76.204 kg)  SpO2 97% Gen: NAD HEENT: NCAT, MMM, oropharynx mildly erythematous but no exudates or masses. Nares patent. TM's clear bilaterally. No anterior cervical lymphadenopathy. Uvula midline. Facial sinuses nontender to palpation. Heart: RRR no murmurs Lungs: CTAB, NWOB, no wheezes or rales GU: no L sided inguinal hernias palpable during valsalva maneuver Neuro: grossly nonfocal, speech normal  ASSESSMENT/PLAN:  1. Cough: likely viral etiology. Already on flonase, albuterol, tessalon with some relief but cough persists. Will rx prednisone 50mg  daily x 5 days to decrease inflammation in airway and vocal cords, which will hopefully help her feel better. Counseled pt that she should return sooner if does not improve in the next few days.  FOLLOW UP: F/u as needed if symptoms worsen or do not improve.  Also discussed with pt that she should f/u with PCP in the next month or so for  her chronic medical problems, she was agreeable.  GrenadaBrittany J. Pollie MeyerMcIntyre, MD [facility]

## 2014-02-14 NOTE — Patient Instructions (Signed)
It was nice to meet you today!  I sent in prednisone 50mg  tablets for you. Take one tablet daily for 5 days. This will decrease the inflammation in your throat and airway.  Return if you aren't getting better in the next few days.  Be well, Dr. Pollie MeyerMcIntyre

## 2014-02-28 ENCOUNTER — Other Ambulatory Visit: Payer: Self-pay | Admitting: Internal Medicine

## 2014-03-30 ENCOUNTER — Other Ambulatory Visit: Payer: Self-pay | Admitting: Internal Medicine

## 2014-05-29 ENCOUNTER — Other Ambulatory Visit: Payer: Self-pay | Admitting: Internal Medicine

## 2014-06-10 NOTE — Progress Notes (Signed)
Patient ID: Jamie Pennington, female    DOB: January 26, 1956, 59 y.o.   MRN: [id number]  HPI    Review of Systems    Physical Exam    HPI Patient is a 59 year old who has been followed by Jamie Pennington. 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 Residual CAD to the LCx. I saw the patient in Sept. At that time her BP was high I increased altace and added amlodipine  In NOvember BP was still high I switched her to prinizide. Since seen she says her BP has been better.  Patinet was last in clinc in Feb 2015 Breathing is OK No Cp  Bothered by allergies   Still limited by foot. No Known Allergies  Current Outpatient Prescriptions  Medication Sig Dispense Refill  . amLODipine (NORVASC) 5 MG tablet TAKE 1 TABLET BY MOUTH EVERY DAY 30 tablet 0  . aspirin 81 MG tablet Take 81 mg by mouth daily.    Marland Kitchen atorvastatin (LIPITOR) 40 MG tablet TAKE 1 TABLET BY MOUTH EVERY DAY 30 tablet 0  . calcium-vitamin D (OSCAL WITH D 500-200) 500-200 MG-UNIT per tablet Take 1 tablet by mouth 2 (two) times daily.      . cetirizine (ZYRTEC) 10 MG tablet Take 10 mg by mouth daily as needed for allergies. Pt states that she rotates between claritin and zyrtec.    Marland Kitchen lisinopril-hydrochlorothiazide (PRINZIDE,ZESTORETIC) 10-12.5 MG per tablet TAKE 1 TABLET BY MOUTH EVERY DAY 30 tablet 10  . loratadine (CLARITIN) 10 MG tablet Take 10 mg by mouth daily as needed for allergies. Pt states that she rotates between zyrtec and claritin.    . metoprolol tartrate (LOPRESSOR) 25 MG tablet TAKE 1 TABLET BY MOUTH TWICE DAILY 60 tablet 12  . Multiple Vitamin (MULTIVITAMIN) tablet Take 1 tablet by mouth daily.      . nitroGLYCERIN (NITROSTAT) 0.4 MG SL tablet Place 1 tablet (0.4 mg total) under the tongue every 5 (five) minutes as needed. 25 tablet 3   No current facility-administered medications for this visit.    Past Medical History  Diagnosis Date  . Myocardial infarct     hx of . s/p stent placement  in 2004  . Hypertension   . Hyperlipidemia   . Coronary artery disease     Past Surgical History  Procedure Laterality Date  . Knee surgery  2010    left  . Cardiac catheterization  2004  . Angioplasty      with stent  placement to the right coronary.  . Breast biopsy      right  . Cotton osteotomy with bone graft Right 11/24/2012    @ [facility]  . Plantar endoscopic fasciotomy Right 11/24/2012    @ [facility]    Family History  Problem Relation Age of Onset  . Stroke Father 13    died  . Alzheimer's disease Mother 41    died  . Heart attack Sister     and bypass surgery    History   Social History  . Marital Status: Legally Separated    Spouse Name: N/A    Number of Children: N/A  . Years of Education: N/A   Occupational History  . Not on file.   Social History Main Topics  . Smoking status: Former Smoker -- 1.00 packs/day for 20 years    Quit date: 01/18/1998  . Smokeless tobacco: Never Used  . Alcohol Use: No  . Drug Use:  No  . Sexual Activity: Not on file   Other Topics Concern  . Not on file   Social History Narrative    Review of Systems:  All systems reviewed.  They are negative to the above problem except as previously stated.  Vital Signs: BP 146/80 mmHg  Pulse 75  Ht 5\' 3"  (1.6 m)  Wt 170 lb 12.8 oz (77.474 kg)  BMI 30.26 kg/m2  Physical Exam  HEENT:  Normocephalic, atraumatic. EOMI, PERRLA.  Neck: JVP is normal.  No bruits.  Lungs: clear to auscultation. No rales no wheezes.  Heart: Regular rate and rhythm. Normal S1, S2. No S3.   No significant murmurs. PMI not displaced.  Abdomen:  Supple, nontender. Normal bowel sounds. No masses. No hepatomegaly.  Extremities:   Good distal pulses throughout. No lower extremity edema.  Musculoskeletal :moving all extremities.  Neuro:   alert and oriented x3.  CN II-XII grossly intact.  EKG  SR 75 bpm   Assessment and Plan: 1  CAD  No symptoms to suggest angina  Encouraged her to increase physical acitvity,  even leg lifts arm exercises  2.  HL  Will check labs today  3.  BP  Patinet did not take meds this am  BP at home usually 120s to 130s Continue meds

## 2014-06-11 ENCOUNTER — Encounter: Payer: Self-pay | Admitting: Internal Medicine

## 2014-06-11 ENCOUNTER — Ambulatory Visit (INDEPENDENT_AMBULATORY_CARE_PROVIDER_SITE_OTHER): Payer: Managed Care, Other (non HMO) | Admitting: Internal Medicine

## 2014-06-11 VITALS — BP 146/80 | HR 75 | Ht 63.0 in | Wt 170.8 lb

## 2014-06-11 DIAGNOSIS — E785 Hyperlipidemia, unspecified: Secondary | ICD-10-CM

## 2014-06-11 DIAGNOSIS — I10 Essential (primary) hypertension: Secondary | ICD-10-CM

## 2014-06-11 LAB — CBC
HCT: 42 % (ref 36.0–46.0)
Hemoglobin: 14 g/dL (ref 12.0–15.0)
MCHC: 33.4 g/dL (ref 30.0–36.0)
MCV: 86.1 fl (ref 78.0–100.0)
Platelets: 338 10*3/uL (ref 150.0–400.0)
RBC: 4.87 Mil/uL (ref 3.87–5.11)
RDW: 13.3 % (ref 11.5–15.5)
WBC: 5.8 10*3/uL (ref 4.0–10.5)

## 2014-06-11 LAB — BASIC METABOLIC PANEL
BUN: 17 mg/dL (ref 6–23)
CALCIUM: 9 mg/dL (ref 8.4–10.5)
CO2: 29 mEq/L (ref 19–32)
Chloride: 100 mEq/L (ref 96–112)
Creatinine, Ser: 0.66 mg/dL (ref 0.40–1.20)
GFR: 97.14 mL/min (ref >60.00)
Glucose, Bld: 96 mg/dL (ref 70–99)
Potassium: 3.8 mEq/L (ref 3.5–5.1)
Sodium: 136 mEq/L (ref 135–145)

## 2014-06-11 LAB — LIPID PANEL
Cholesterol: 172 mg/dL (ref 0–200)
HDL: 45.9 mg/dL (ref >39.00)
LDL Cholesterol: 86 mg/dL (ref 0–99)
NonHDL: 126.1
Total CHOL/HDL Ratio: 4
Triglycerides: 200 mg/dL — ABNORMAL HIGH (ref 0.0–149.0)
VLDL: 40 mg/dL (ref 0.0–40.0)

## 2014-06-11 LAB — AST: AST: 18 U/L (ref 0–37)

## 2014-06-11 NOTE — Patient Instructions (Signed)
Your physician recommends that you continue on your current medications as directed. Please refer to the Current Medication list given to you today. Your physician recommends that you return for lab work in: today (BMET, CBC, LIPIDS, AST) Your physician wants you to follow-up in: 1 YEAR WITH DR ROSS.  You will receive a reminder letter in the mail two months in advance. If you don't receive a letter, please call our office to schedule the follow-up appointment.

## 2014-06-30 ENCOUNTER — Other Ambulatory Visit: Payer: Self-pay | Admitting: Internal Medicine

## 2014-07-06 ENCOUNTER — Other Ambulatory Visit: Payer: Self-pay | Admitting: *Deleted

## 2014-07-06 MED ORDER — AMLODIPINE BESYLATE 5 MG PO TABS: 5.0000 mg | ORAL_TABLET | Freq: Every day | ORAL | Status: DC

## 2014-07-18 ENCOUNTER — Ambulatory Visit
Admission: RE | Admit: 2014-07-18 | Discharge: 2014-07-18 | Disposition: A | Discharge status: Home / Self Care | Payer: Managed Care, Other (non HMO) | Source: Ambulatory Visit | Attending: Family Medicine | Admitting: Family Medicine

## 2014-07-18 ENCOUNTER — Ambulatory Visit (INDEPENDENT_AMBULATORY_CARE_PROVIDER_SITE_OTHER): Payer: Managed Care, Other (non HMO) | Admitting: Family Medicine

## 2014-07-18 ENCOUNTER — Encounter: Payer: Self-pay | Admitting: Family Medicine

## 2014-07-18 VITALS — BP 139/77 | HR 75 | Temp 98.2°F | Ht 63.0 in | Wt 169.5 lb

## 2014-07-18 DIAGNOSIS — R059 Cough, unspecified: Secondary | ICD-10-CM

## 2014-07-18 DIAGNOSIS — R05 Cough: Secondary | ICD-10-CM

## 2014-07-18 MED ORDER — OMEPRAZOLE 20 MG PO CPDR: 20.0000 mg | DELAYED_RELEASE_CAPSULE | Freq: Every day | ORAL | Status: DC

## 2014-07-18 NOTE — Progress Notes (Signed)
   Subjective:    Patient ID: Jamie Pennington, female    DOB: 10/28/1955, 59 y.o.   MRN: [id number]  HPI  Cough For last 5 weeks.  Mostly nonproductive occasional brown.  Keeps up at night and causes stress incontinence.  Has tried antibiotics, tessalon, montelukast, prednisone from other providers without help No leg swelling. Is sleeping on pillows recently.  No new medication changes but is on ACEI combo. Does have occasional heartburn worse at night.  No weight loss or fever.  PMH Is former smoker.   Stopped > 10 yrs ago     Review of Systems     Objective:   Physical Exam  Alert no acute distress Lungs:  Normal respiratory effort, chest expands symmetrically. Lungs are clear to auscultation, no crackles or wheezes. Heart - Regular rate and rhythm.  No murmurs, gallops or rubs.    Skin:  Intact without suspicious lesions or rashes Neck:  No deformities, thyromegaly, masses, or tenderness noted.   Supple with full range of motion without pain.       Assessment & Plan:

## 2014-07-18 NOTE — Patient Instructions (Signed)
Cough  Continue your current antihistamine Start the omperzole 20 mg at night  Will contact you about your chest xray  If not better in 2 weeks - call we will likely start a nasal steroid  If you have fever or worsening shortness of breath or cough up a lot of sputum or any blood call us

## 2014-07-18 NOTE — Assessment & Plan Note (Addendum)
Worsened.  Many possibilities given she is a former smoke will check chest xray to rule out cancer.  Treat for reflux and monitor closely

## 2014-07-19 ENCOUNTER — Encounter: Payer: Self-pay | Admitting: Family Medicine

## 2014-07-31 ENCOUNTER — Other Ambulatory Visit: Payer: Self-pay

## 2014-08-01 LAB — CYTOLOGY - PAP

## 2014-08-31 ENCOUNTER — Other Ambulatory Visit: Payer: Self-pay | Admitting: Internal Medicine

## 2014-10-05 ENCOUNTER — Encounter: Payer: Self-pay | Admitting: Family Medicine

## 2014-10-05 ENCOUNTER — Ambulatory Visit (INDEPENDENT_AMBULATORY_CARE_PROVIDER_SITE_OTHER): Payer: Managed Care, Other (non HMO) | Admitting: Family Medicine

## 2014-10-05 VITALS — BP 180/91 | HR 93 | Temp 98.7°F | Ht 63.0 in | Wt 170.0 lb

## 2014-10-05 DIAGNOSIS — R059 Cough, unspecified: Secondary | ICD-10-CM

## 2014-10-05 DIAGNOSIS — R0602 Shortness of breath: Secondary | ICD-10-CM

## 2014-10-05 DIAGNOSIS — R05 Cough: Secondary | ICD-10-CM

## 2014-10-05 MED ORDER — ALBUTEROL SULFATE (2.5 MG/3ML) 0.083% IN NEBU
2.5000 mg | INHALATION_SOLUTION | Freq: Once | RESPIRATORY_TRACT | Status: AC
  Administered 2014-10-05: 2.5 mg via RESPIRATORY_TRACT

## 2014-10-05 MED ORDER — ALBUTEROL SULFATE HFA 108 (90 BASE) MCG/ACT IN AERS: 6.0000 | INHALATION_SPRAY | Freq: Four times a day (QID) | RESPIRATORY_TRACT | Status: DC | PRN

## 2014-10-05 NOTE — Patient Instructions (Addendum)
It was a pleasure seeing you today, Ms Jamie Pennington!  I am sorry that you have been ill so much this year.  I recommend that you schedule pulmonary function tests with Dr Raymondo BandKoval at your earliest convenience.  Once these results are in we can refer you to a pulmonologist.  I have sent in the Albuterol HFA for you.  If your symptoms worsen please come back in for an appointment or go to the ED.  Please feel free to call our office at [phone] if any questions or concerns arise.  Warm Regards, Jaidalyn Schillo M. Jerie Basford, DO  Shortness of Breath Shortness of breath means you have trouble breathing. Shortness of breath needs medical care right away. HOME CARE   Do not smoke.  Avoid being around chemicals or things (paint fumes, dust) that may bother your breathing.  Rest as needed. Slowly begin your normal activities.  Only take medicines as told by your doctor.  Keep all doctor visits as told. GET HELP RIGHT AWAY IF:   Your shortness of breath gets worse.  You feel lightheaded, pass out (faint), or have a cough that is not helped by medicine.  You cough up blood.  You have pain with breathing.  You have pain in your chest, arms, shoulders, or belly (abdomen).  You have a fever.  You cannot walk up stairs or exercise the way you normally do.  You do not get better in the time expected.  You have a hard time doing normal activities even with rest.  You have problems with your medicines.  You have any new symptoms. MAKE SURE YOU:  Understand these instructions.  Will watch your condition.  Will get help right away if you are not doing well or get worse. Document Released: 10/14/2007 Document Revised: 05/02/2013 Document Reviewed: 07/13/2011 [facility] Patient Information 2015 [employer] [place]. This information is not intended to replace advice given to you by your health care provider. Make sure you discuss any questions you have with your health care provider.

## 2014-10-06 NOTE — Progress Notes (Signed)
Patient ID: Jamie Pennington, female   DOB: Apr 30, 1956, 59 y.o.   MRN: [id number]    Subjective: CC: SOB, cough HPI: Patient is a 59 y.o. female presenting to clinic today for same day appointment. Concerns today include:  1. SOB, cough Patient accompanied to visit by her daughter, who also provides some of the history.  Patient reports that she was recently diagnosed and treated for PNA during a trip to [place].  She states that this is her 3rd pneumonia this year.  She states that she had a CXR obtained at that time and was placed on Prednisone, Levaquin and Tessalon perles.  She completed Prednisone and Levaquin yesterday 5/26.  She reports that breathing has improved greatly since being treated; however, she continues to have SOB with exertion.  She has been using ProAir Respi-click 2 puff q4 hours x last couple of days, without much relief.  Denies h/o COPD.  States she sees an ProofreaderAllergist Dr Lucie LeatherKozlow, who prescribes her inhalers (incl Annuity Ellipta, which she takes daily).  She denies fevers, chills, myalgias, CP, edema, sore throat.  Endorses some fatigue and cough.    Social History Reviewed: former smoker, quit 2000 FamHx and MedHx updated.  Please see EMR.  ROS: All other systems reviewed and are negative.  Objective: Office vital signs reviewed. BP 180/91 mmHg  Pulse 93  Temp(Src) 98.7 F (37.1 C) (Oral)  Ht 5\' 3"  (1.6 m)  Wt 170 lb (77.111 kg)  BMI 30.12 kg/m2  SpO2 95%  Physical Examination:  General: Awake, alert, tired appearing, NAD HEENT: Normal, EOMI Neck: No masses palpated. No LAD. No JVD Cardio: RRR, S1S2 heard, no murmurs appreciated Pulm: globally decreased air movement, +expiratory wheezes, no rhonchi or rales, +cough, patient stable on room are with normal WOB Extremities: WWP, No edema, cyanosis or clubbing; +2 pulses bilaterally MSK: Normal gait and station Skin: dry, intact, no rashes or lesions  Pulm exam repeated after breathing treatment: Air movement  improved, continued mild expiratory wheeze but improved  Assessment: 59 y.o. female s/p treatment for pneumonia.  Suspect that her underlying asthma is exacerbated by recent illness.  I also question if patient may have underlying COPD in setting of h/o tobacco use.   Would also consider possible cardiac etiology if continued SOB beyond recent illness, no evidence of fluid overload on today's exam.        Ambulatory O2 saturation on room air: Resting: 95-96%     With ambulation: 96%  Plan: -Ambulatory O2 obtained. -Albuterol neb given in office with good response.  -Repeat CXR ordered -Discussed new formulation of ProAir inhaler with Dr Raymondo BandKoval and he informs me that good respiratory effort is needed in order to get medication into lungs.  For this reason, old formulation of ProAir HFA prescribed.   -Patient to take 6 puffs albuterol q6 hours x48 hours then use 2 puffs q6 hours as needed in addition to her daily maintenance Ellipta inhaler. -No new abx or steroids prescribed in setting of recent completion. -Encouraged patient to schedule appointment with Dr Raymondo BandKoval for PFTs -Would consider referral to pulmonology after PFTs obtained in setting of recurrent illness this year. -Consider echo if continued SOB -Return precautions reviewed with patient and daughter -Plan to follow up with PCP in 1-2 weeks  Additionally, BP noted to be elevated.  Unfortunately, this was not re-measured before patient was discharged home.  Would recommend continued monitoring of this and adjustments to BP medication as indicated.  Raliegh IpAshly M Gottschalk, DO PGY-1,  [place]

## 2014-10-08 NOTE — Progress Notes (Signed)
Discussed with resident. I recommended BP recheck and medication adjustment for this patient during this visit.

## 2014-10-09 ENCOUNTER — Ambulatory Visit: Payer: Managed Care, Other (non HMO) | Admitting: Family Medicine

## 2014-10-11 ENCOUNTER — Ambulatory Visit (INDEPENDENT_AMBULATORY_CARE_PROVIDER_SITE_OTHER): Payer: Managed Care, Other (non HMO) | Admitting: Pharmacist

## 2014-10-11 VITALS — BP 186/80 | HR 79 | Ht 64.0 in | Wt 173.9 lb

## 2014-10-11 DIAGNOSIS — R05 Cough: Secondary | ICD-10-CM

## 2014-10-11 DIAGNOSIS — I10 Essential (primary) hypertension: Secondary | ICD-10-CM

## 2014-10-11 DIAGNOSIS — R059 Cough, unspecified: Secondary | ICD-10-CM

## 2014-10-11 NOTE — Patient Instructions (Signed)
No changes today to medications.   As long as you continue to improve and do better - follow up with Dr Raymondo BandKoval in [facility] in 2-3 weeks for PFTs. .  Follow up with Dr. Deirdre Priesthambliss at the end of June.     If your blood pressure stays elevated or if your breathing continues to be a challenge - Call of office and be seen earlier.

## 2014-10-11 NOTE — Progress Notes (Signed)
S:    Patient arrives alone ambulating without assistance and speaking softly (hoarse due to recent breathing exacerbation)    Presents for lung function evaluation.  She states he breathing is ~ 80% of normal today.  Patient reports breathing has been improving gradually over the last few days.  She notes she was treated in [place] and instructed to have a chest X-ray.   She inquires if that is still necessary.    Patient reports last dose of asthma medications was yesterday.  She admits to taking higher than prescribed doses of both albuterol and her Fluticasone inhaler.  We decided to defer lung function evaluation until she improved AND her throat was less irritated.    O: BP 186/80 mmHg  Pulse 79  Ht 5\' 4"  (1.626 m)  Wt 173 lb 14.4 oz (78.881 kg)  BMI 29.84 kg/m2  SpO2 96%    A/P: Patient has been experiencing difficulty breathing since being diagnosed with a possible pneumonia.   She has completed a course of antibiotics AND steroids. Patient assessment and plan was discussed with Dr. Deirdre Priesthambliss (attending MD for the day and PCP).   Deferred PFT evaluation until a future time when breathing was improved.   No change in Respiratory therapy medication plan at visit today AND follow up X-ray was planned for several weeks into the future as long as she continues to improve.   Blood Pressure Elevation likely due to acute illness.   Patient monitors blood pressure at home.   Discussed with Dr. Deirdre Priesthambliss and agreed to follow with home BP monitoring and reevaluate if this remains elevated over the next several days.  Plan to reassess for possible adjustment of therapy at next visit.    Written pt instructions provided.  F/U Clinic visit with Dr. Deirdre Priesthambliss in 3-4 weeks AND follow up with PFTs in 2-3 weeks.   Total time in face to face counseling 25 minutes.  Patient seen with Andria MeuseSimone Carden, PharmD Candidate and Enzo BiNathan Batchelder, PharmD Resident. .Marland Kitchen

## 2014-10-12 ENCOUNTER — Encounter: Payer: Self-pay | Admitting: Pharmacist

## 2014-10-12 NOTE — Progress Notes (Signed)
Patient ID: Jamie SolianGail Spooner, female   DOB: February 06, 1956, 59 y.o.   MRN: [id number] Reviewed: Agree with Dr. Macky LowerKoval's documentation and management.

## 2014-10-12 NOTE — Assessment & Plan Note (Signed)
Cough with history of "asthma" managed by Dr. Lucie LeatherKozlow.  Patient has been experiencing difficulty breathing since being diagnosed with a possible pneumonia.   She has completed a course of antibiotics AND steroids. Patient assessment and plan was discussed with Dr. Deirdre Priesthambliss (attending MD for the day and PCP).   Deferred PFT evaluation until a future time when breathing was improved.   No change in Respiratory therapy medication plan at visit today AND follow up X-ray was planned for several weeks into the future as long as she continues to improve.

## 2014-10-12 NOTE — Assessment & Plan Note (Signed)
Patient has been experiencing difficulty breathing since being diagnosed with a possible pneumonia.   She has completed a course of antibiotics AND steroids. Patient assessment and plan was discussed with Dr. Deirdre Priesthambliss (attending MD for the day and PCP).   Deferred PFT evaluation until a future time when breathing was improved.   No change in Respiratory therapy medication plan at visit today AND follow up X-ray was planned for several weeks into the future as long as she continues to improve.   Blood Pressure Elevation likely due to acute illness.   Patient monitors blood pressure at home.   Discussed with Dr. Deirdre Priesthambliss and agreed to follow with home BP monitoring and reevaluate if this remains elevated over the next several days.  Plan to reassess for possible adjustment of therapy at next visit.

## 2014-10-26 ENCOUNTER — Encounter: Payer: Self-pay | Admitting: Pharmacist

## 2014-10-26 ENCOUNTER — Ambulatory Visit (INDEPENDENT_AMBULATORY_CARE_PROVIDER_SITE_OTHER): Payer: Managed Care, Other (non HMO) | Admitting: Pharmacist

## 2014-10-26 VITALS — BP 165/72 | HR 70 | Ht 63.0 in | Wt 173.9 lb

## 2014-10-26 DIAGNOSIS — I10 Essential (primary) hypertension: Secondary | ICD-10-CM

## 2014-10-26 DIAGNOSIS — R059 Cough, unspecified: Secondary | ICD-10-CM

## 2014-10-26 DIAGNOSIS — R05 Cough: Secondary | ICD-10-CM

## 2014-10-26 MED ORDER — MOMETASONE FURO-FORMOTEROL FUM 200-5 MCG/ACT IN AERO: 2.0000 | INHALATION_SPRAY | Freq: Two times a day (BID) | RESPIRATORY_TRACT | Status: DC

## 2014-10-26 MED ORDER — LOSARTAN POTASSIUM 100 MG PO TABS: 50.0000 mg | ORAL_TABLET | Freq: Every day | ORAL | Status: DC

## 2014-10-26 NOTE — Assessment & Plan Note (Signed)
Spirometry evaluation reveals mild/moderate obstructive lung disease corresponding to GOLD Classification D based on spirometry, >2 exacerbations in the past year (per patient report) and CAT score of 22. Patient had recent viral illness that could be contributing to these symptoms.  Patient has been experiencing shortness of breath and coughing for months and taking fluticasone and albuterol as needed. Discontinue fluticasone and start Dulera 200-5 mcg 2 puffs twice daily. Continue albuterol inhaler as needed. Reviewed results of pulmonary function tests. Educated on reducing triggers including washing the sheets, staying outdoors as much as possible, taking a pillow from home instead of sleeping on the pillows kept there. Continue using Nasonex 2 puffs daily and Zyrtec/Claritin. Recommended using saline nasal spray as often as needed. Patient to follow up with Dr. Deirdre Priest next week. Consider addition of Spiriva if Dulera does not provide adequate control.    Hypertension: Uncontrolled on amlodipine 5 mg and losartan 50 mg with blood pressure 165/72. Patient has lower extremity edema so avoid increasing amlodipine. Increase losartan to 100 mg daily. Consider down-titration or discontinuation of amlodipine if leg swelling continues. Addition of thiazide with losartan may also be considered as alternative to amlodipine.  Follow up blood pressure at next visit.

## 2014-10-26 NOTE — Progress Notes (Signed)
S:    Patient arrives ambulating without assistance. She is accompanied by her daughter and granddaughter. Presents for lung function evaluation.   Lung function tests were deferred a few weeks ago due to respiratory viral illness.  Patient reports breathing has improved since last visit but still has a cough. This cough impacts her daily activities and she feels that it slows her down.   Patient reports last dose of albuterol was yesterday morning. She denies that albuterol improves her breathing when she uses it. She does notice that she has some triggers, such as environmental triggers. She is going to the beach soon and she and her daughter have noticed that she breathing is worse when they are staying at their trailer.   Patient would like to speak with Dr. Deirdre Priest. She reports that she left a phone message for him and she needs to follow up with him prior to her visit next.    O: CAT score= 22 See Documentation Flowsheet - CAT/COPD for complete symptom scoring.  See "scanned report" or Documentation Flowsheet (discrete results - PFTs) for  Spirometry results. Patient provided good effort while attempting spirometry.   Albuterol Neb  Lot# [id number]     Exp. 07/2015  A/P:  Spirometry evaluation reveals mild/moderate obstructive lung disease corresponding to GOLD Classification D based on spirometry, >2 exacerbations in the past year (per patient report) and CAT score of 22. Patient had recent viral illness that could be contributing to these symptoms.  Patient has been experiencing shortness of breath and coughing for months and taking fluticasone and albuterol as needed. Discontinue fluticasone and start Dulera 200-5 mcg 2 puffs twice daily. Continue albuterol inhaler as needed. Reviewed results of pulmonary function tests. Educated on reducing triggers including washing the sheets, staying outdoors as much as possible, taking a pillow from home instead of sleeping on the pillows kept there.  Continue using Nasonex 2 puffs daily and Zyrtec/Claritin. Recommended using saline nasal spray as often as needed. Patient to follow up with Dr. Deirdre Priest next week. Consider addition of Spiriva if Dulera does not provide adequate control.    Hypertension: Uncontrolled on amlodipine 5 mg and losartan 50 mg with blood pressure 165/72. Patient has lower extremity edema so avoid increasing amlodipine. Increase losartan to 100 mg daily. Consider down-titration or discontinuation of amlodipine if leg swelling continues. Addition of thiazide with losartan may also be considered as alternative to amlodipine.  Follow up blood pressure at next visit.   Pt verbalized understanding of results and education.  Written pt instructions provided.  F/U Clinic visit with Dr. Deirdre Priest on 10/31/14. Total time in face to face counseling 60 minutes.  Patient seen with Andria Meuse, PharmD Candidate and Enzo Bi, PharmD Resident and Juanita Craver, PharmD, [facility] resident.

## 2014-10-26 NOTE — Patient Instructions (Addendum)
It was nice to see you today!  Your lung function test showed moderate obstruction.  Start Dulera 2 puffs twice daily. Stop the fluticasone (Flovent) inhaler. Keep using the albuterol inhaler as you need it.   Increase your losartan to 100 mg daily for your blood pressure.  Avoid triggers for your breathing (smoking).   Back to see Dr. Deirdre Priest next week.

## 2014-10-26 NOTE — Assessment & Plan Note (Signed)
Uncontrolled on amlodipine 5 mg and losartan 50 mg with blood pressure 165/72. Patient has lower extremity edema so avoid increasing amlodipine. Increase losartan to 100 mg daily. Consider down-titration or discontinuation of amlodipine if leg swelling continues. Addition of thiazide with losartan may also be considered as alternative to amlodipine.  Follow up blood pressure at next visit.

## 2014-10-27 NOTE — Progress Notes (Signed)
Patient ID: Jamie Pennington, female   DOB: 1956-05-01, 59 y.o.   MRN: [id number] Reviewed: Agree with Dr. Macky Lower documentation and management.

## 2014-10-30 ENCOUNTER — Ambulatory Visit
Admission: RE | Admit: 2014-10-30 | Discharge: 2014-10-30 | Disposition: A | Discharge status: Home / Self Care | Payer: Managed Care, Other (non HMO) | Source: Ambulatory Visit | Attending: Family Medicine | Admitting: Family Medicine

## 2014-10-30 DIAGNOSIS — R05 Cough: Secondary | ICD-10-CM

## 2014-10-30 DIAGNOSIS — R059 Cough, unspecified: Secondary | ICD-10-CM

## 2014-10-31 ENCOUNTER — Encounter: Payer: Self-pay | Admitting: Family Medicine

## 2014-10-31 ENCOUNTER — Telehealth: Payer: Self-pay | Admitting: Internal Medicine

## 2014-10-31 ENCOUNTER — Ambulatory Visit (INDEPENDENT_AMBULATORY_CARE_PROVIDER_SITE_OTHER): Payer: Managed Care, Other (non HMO) | Admitting: Family Medicine

## 2014-10-31 VITALS — BP 125/60 | HR 81 | Temp 97.8°F | Ht 63.0 in | Wt 175.8 lb

## 2014-10-31 DIAGNOSIS — R0600 Dyspnea, unspecified: Secondary | ICD-10-CM

## 2014-10-31 DIAGNOSIS — J438 Other emphysema: Secondary | ICD-10-CM

## 2014-10-31 DIAGNOSIS — J449 Chronic obstructive pulmonary disease, unspecified: Secondary | ICD-10-CM | POA: Insufficient documentation

## 2014-10-31 DIAGNOSIS — R5383 Other fatigue: Secondary | ICD-10-CM | POA: Diagnosis not present

## 2014-10-31 LAB — CBC
HCT: 42.7 % (ref 36.0–46.0)
HEMOGLOBIN: 14 g/dL (ref 12.0–15.0)
MCH: 28.2 pg (ref 26.0–34.0)
MCHC: 32.8 g/dL (ref 30.0–36.0)
MCV: 85.9 fL (ref 78.0–100.0)
MPV: 9 fL (ref 8.6–12.4)
Platelets: 341 10*3/uL (ref 150–400)
RBC: 4.97 MIL/uL (ref 3.87–5.11)
RDW: 15.1 % (ref 11.5–15.5)
WBC: 5 10*3/uL (ref 4.0–10.5)

## 2014-10-31 LAB — COMPREHENSIVE METABOLIC PANEL
ALT: 31 U/L (ref 0–35)
AST: 19 U/L (ref 0–37)
Albumin: 4 g/dL (ref 3.5–5.2)
Alkaline Phosphatase: 94 U/L (ref 39–117)
BILIRUBIN TOTAL: 0.8 mg/dL (ref 0.2–1.2)
BUN: 18 mg/dL (ref 6–23)
CO2: 26 mEq/L (ref 19–32)
CREATININE: 0.6 mg/dL (ref 0.50–1.10)
Calcium: 9.1 mg/dL (ref 8.4–10.5)
Chloride: 106 mEq/L (ref 96–112)
Glucose, Bld: 101 mg/dL — ABNORMAL HIGH (ref 70–99)
Potassium: 4 mEq/L (ref 3.5–5.3)
Sodium: 139 mEq/L (ref 135–145)
Total Protein: 6.3 g/dL (ref 6.0–8.3)

## 2014-10-31 LAB — TSH: TSH: 2.439 u[IU]/mL (ref 0.350–4.500)

## 2014-10-31 NOTE — Progress Notes (Signed)
   Subjective:    Patient ID: Jamie Pennington, female    DOB: [birth year], 59 y.o.   MRN: [id number]  HPI  Fatigue According to patient and daughter has had decreased energy and gets shortness of breath with exertion for the last few years.  Has been worsened with recent pulmonary symptoms and cough.  No weight loss or fever or adenopathy.  Does notice lower extrem swelling worsening during the day.  No chest pain with exertion.  Has not mentioned this to her cardiologist.  Cough Improving but still bothersome.  Did not bring medications.  Unsure of exactly what she is taking from [place] and her allergist.  Seems to have at least 4 inhalers.  Chief Complaint noted Review of Symptoms - see HPI PMH - Smoking status noted.   Vital Signs reviewed   Review of Systems     Objective:   Physical Exam Alert no acute distress Heart - Regular rate and rhythm.  No murmurs, gallops or rubs.    Lungs:  Normal respiratory effort, chest expands symmetrically. Lungs are clear to auscultation, no crackles or wheezes. Extrem - trace edema at ankles  Able to walk briskly around clinic with pox 96% and hr of 80s but feels shortness of breath        Assessment & Plan:

## 2014-10-31 NOTE — Telephone Encounter (Signed)
Pt c/o Shortness Of Breath: STAT if SOB developed within the last 24 hours or pt is noticeably SOB on the phone  1. Are you currently SOB (can you hear that pt is SOB on the phone)? Yes  2. How long have you been experiencing SOB? 1 month  3. Are you SOB when sitting or when up moving around? Moving around  4. Are you currently experiencing any other symptoms? Ankle swelling, fatigue    Pt c/o swelling: STAT is pt has developed SOB within 24 hours  1. How long have you been experiencing swelling? Daughter stated she didn't know  2. Where is the swelling located? Both ankles swelling  3.  Are you currently taking a "fluid pill"?No  4.  Are you currently SOB? Yes  5.  Have you traveled recently? 2 months ago  Pt just came from PCP office and was told to come to office to be checked out.

## 2014-10-31 NOTE — Telephone Encounter (Signed)
I spoke with the patient's daughter and she is aware of Dr. Tenny Craw' recommendations. She did have quite a bit of labwork with Dr. Deirdre Priest this morning. I advised we would call there to obtain results, but will order the echo and have the PCC's call to set this up. Angelica Chessman is aware that the patient is tentatively scheduled to follow up with Dr. Tenny Craw on 11/09/14 at 11:30 am.  Will most likely need to wait until to tomorrow/ Friday to call for labs from PCP so they can be faxed when resulted.

## 2014-10-31 NOTE — Telephone Encounter (Signed)
Set up for CBC, BMET,BNP and echo   Dx  SOB and edema

## 2014-10-31 NOTE — Assessment & Plan Note (Signed)
Cough is improving slowly.  Need to sort out her medications.  Asked to bring all in next visit.

## 2014-10-31 NOTE — Assessment & Plan Note (Signed)
Semi Chronic worsened by recent pulmonary symptoms.  Likely multifactorial (copd, deconditioning, heart disease)  Asked her to follow up with cardiology to see if they believe could be contributing.  Check labs.  Likely will need an exercise program. Continue copd treatment

## 2014-10-31 NOTE — Telephone Encounter (Signed)
I called and spoke with the patient and her daughter.  The patient has had problems with SOB/ fatigue/ swelling for about a 1-2 months. She saw her PCP today and was told to call and schedule follow up with cardiology. Per the patient's daughter, she is being treated for some possible "lung damage" as well. She is having bilateral ankle edema.  She is SOB with activity only.  She does not weigh at home regullary- typical weight per her report is from 160-170 lbs. She was weighed at the doctors office last week and was 173 lbs and today she is 175 lbs- she feels the increase is due to the shoes she is wearing.  I advised the patient and her daughter I would forward this message to Dr. Tenny Craw to review and see how soon she felt like the patient needed to be seen. As it stands right now, Dr. Charlott Rakes first available is on 11/09/14.  I will go ahead and schedule in this slot to have the appointment, but advised the patient's daughter we will call back to follow up after review with Dr. Tenny Craw.  She is agreeable.

## 2014-10-31 NOTE — Patient Instructions (Signed)
Good to see you today!  Thanks for coming in.  Make an appointment to see Dr Tenny Craw to discuss your fatigue and shortness of breath with walking  Come back and see me in 2-3 weeks and bring all your medications bottles etc   I will call you if your lab tests are not normal.  Otherwise we will discuss them at your next visit.  Try to slowly increase your walking distance - do not push it hard and if you have severe shortness of breath or chest pain call 911

## 2014-11-01 ENCOUNTER — Other Ambulatory Visit: Payer: Self-pay | Admitting: Internal Medicine

## 2014-11-02 NOTE — Telephone Encounter (Signed)
Labs with Dr. Deirdre Priest on 6/22 are resulted in EPIC. There is CBC, CMET and TSH. Ordered BNP to be done when patient comes for echo next Tue.  Spoke with patient's daughter Angelica Chessman to inform.

## 2014-11-06 ENCOUNTER — Other Ambulatory Visit: Payer: Self-pay

## 2014-11-06 ENCOUNTER — Other Ambulatory Visit (INDEPENDENT_AMBULATORY_CARE_PROVIDER_SITE_OTHER): Payer: Managed Care, Other (non HMO) | Admitting: *Deleted

## 2014-11-06 ENCOUNTER — Ambulatory Visit (HOSPITAL_COMMUNITY): Payer: Managed Care, Other (non HMO) | Attending: Internal Medicine

## 2014-11-06 DIAGNOSIS — I5189 Other ill-defined heart diseases: Secondary | ICD-10-CM | POA: Diagnosis not present

## 2014-11-06 DIAGNOSIS — R0600 Dyspnea, unspecified: Secondary | ICD-10-CM

## 2014-11-06 LAB — BRAIN NATRIURETIC PEPTIDE: Pro B Natriuretic peptide (BNP): 106 pg/mL — ABNORMAL HIGH (ref 0.0–100.0)

## 2014-11-09 ENCOUNTER — Ambulatory Visit (INDEPENDENT_AMBULATORY_CARE_PROVIDER_SITE_OTHER): Payer: Managed Care, Other (non HMO) | Admitting: Internal Medicine

## 2014-11-09 ENCOUNTER — Encounter: Payer: Self-pay | Admitting: Internal Medicine

## 2014-11-09 VITALS — BP 142/82 | HR 68 | Ht 63.0 in | Wt 178.4 lb

## 2014-11-09 DIAGNOSIS — R0602 Shortness of breath: Secondary | ICD-10-CM

## 2014-11-09 MED ORDER — FUROSEMIDE 40 MG PO TABS: 40.0000 mg | ORAL_TABLET | ORAL | Status: DC | PRN

## 2014-11-09 MED ORDER — NITROGLYCERIN 0.4 MG SL SUBL: 0.4000 mg | SUBLINGUAL_TABLET | SUBLINGUAL | Status: DC | PRN

## 2014-11-09 NOTE — Progress Notes (Addendum)
 Cardiology Office Note   Date:  11/09/2014   ID:  Jamie Pennington, DOB 02/27/1956, MRN [id number]  PCP:  CHAMBLISS,MARSHALL L, MD  Cardiologist:   Vanessa Alesi, MD   No chief complaint on file.     History of Present Illness: Jamie Pennington is a 59 y.o. female with a history of CAD    She was followed by [facility] in past   She has a history of CAD, s/p IWMI with stent to the RCA in 2004. Residual CAD to the LCx. She also has a history of HTN This year she has a few bouts of pneumonia  Last was in [place] She was seen by Dr Chamblis  With SOB  Treated for COPD exacerbation  He noticed lower extremity swelling    Daughter says that pt is moving slower  Looks like aged     Current Outpatient Prescriptions  Medication Sig Dispense Refill  . albuterol (PROVENTIL HFA;VENTOLIN HFA) 108 (90 BASE) MCG/ACT inhaler Inhale 6 puffs into the lungs every 6 (six) hours as needed. x48 hours then use 2 puffs every 6 hours as needed 1 Inhaler 0  . amLODipine (NORVASC) 5 MG tablet Take 1 tablet (5 mg total) by mouth daily. 30 tablet 11  . aspirin 81 MG tablet Take 81 mg by mouth daily.    . atorvastatin (LIPITOR) 40 MG tablet TAKE 1 TABLET BY MOUTH EVERY DAY 30 tablet 6  . calcium-vitamin D (OSCAL WITH D 500-200) 500-200 MG-UNIT per tablet Take 1 tablet by mouth 2 (two) times daily.      . cetirizine (ZYRTEC) 10 MG tablet Take 10 mg by mouth daily as needed for allergies. Pt states that she rotates between claritin and zyrtec.    . diclofenac (VOLTAREN) 75 MG EC tablet Take 75 mg by mouth 2 (two) times daily.    . Fluticasone Furoate 100 MCG/ACT AEPB Inhale 1-2 puffs into the lungs 2 (two) times daily.    . Fluticasone Furoate-Vilanterol (BREO ELLIPTA) 100-25 MCG/INH AEPB Inhale 1 puff into the lungs as directed.    . losartan (COZAAR) 100 MG tablet Take 0.5 tablets (50 mg total) by mouth daily. 30 tablet 12  . metoprolol tartrate (LOPRESSOR) 25 MG tablet TAKE 1 TABLET BY MOUTH TWICE DAILY 60 tablet 3    . mometasone (NASONEX) 50 MCG/ACT nasal spray Place 1 spray into the nose daily.    . mometasone-formoterol (DULERA) 200-5 MCG/ACT AERO Inhale 2 puffs into the lungs 2 (two) times daily. 1 Inhaler 0  . Multiple Vitamin (MULTIVITAMIN) tablet Take 1 tablet by mouth daily.      . nitroGLYCERIN (NITROSTAT) 0.4 MG SL tablet Place 1 tablet (0.4 mg total) under the tongue every 5 (five) minutes as needed. 25 tablet 3  . omeprazole (PRILOSEC) 40 MG capsule Take 40 mg by mouth daily.    . ranitidine (ZANTAC) 300 MG tablet Take 300 mg by mouth at bedtime.     No current facility-administered medications for this visit.    Allergies:   Review of patient's allergies indicates no known allergies.   Past Medical History  Diagnosis Date  . Myocardial infarct     hx of . s/p stent placement in 2004  . Hypertension   . Hyperlipidemia   . Coronary artery disease     Past Surgical History  Procedure Laterality Date  . Knee surgery  2010    left  . Cardiac catheterization  2004  . Angioplasty        with stent  placement to the right coronary.  . Breast biopsy      right  . Cotton osteotomy with bone graft Right 11/24/2012    @ [facility]  . Plantar endoscopic fasciotomy Right 11/24/2012    @ [facility]     Social History:  The patient  reports that she quit smoking about 16 years ago. She has never used smokeless tobacco. She reports that she does not drink alcohol or use illicit drugs.   Family History:  The patient's family history includes Alzheimer's disease (age of onset: 6990) in her mother; Heart attack in her sister; Stroke (age of onset: 8160) in her father.    ROS:  Please see the history of present illness. All other systems are reviewed and  Negative to the above problem except as noted.    PHYSICAL EXAM: VS:  BP 142/82 mmHg  Pulse 68  Ht 5\' 3"  (1.6 m)  Wt 178 lb 6.4 oz (80.922 kg)  BMI 31.61 kg/m2  SpO2 97%  GEN: Well nourished, well developed, in no acute distress HEENT: normal Neck: no  JVD, carotid bruits, or masses Cardiac: RRR; no murmurs, rubs, or gallops,no edema  Respiratory:  Some decreased airflow   GI: soft, nontender, nondistended, + BS  No hepatomegaly  MS: no deformity Moving all extremities   Skin: warm and dry, no rash Neuro:  Strength and sensation are intact Psych: euthymic mood, full affect   EKG:  EKG is not  ordered today.   Lipid Panel    Component Value Date/Time   CHOL 172 06/11/2014 1022   TRIG 200.0* 06/11/2014 1022   HDL 45.90 06/11/2014 1022   CHOLHDL 4 06/11/2014 1022   VLDL 40.0 06/11/2014 1022   LDLCALC 86 06/11/2014 1022   LDLDIRECT 99 08/02/2012 0917      Wt Readings from Last 3 Encounters:  11/09/14 178 lb 6.4 oz (80.922 kg)  10/31/14 175 lb 12.8 oz (79.742 kg)  10/26/14 173 lb 14.4 oz (78.881 kg)      ASSESSMENT AND PLAN: 1  Dyspnea  SOB most likely due to COPD  But I would set up for lexiscan moview to r/o ischemia as cause On exam she has some sl LE edema  Can use lasix 40 a couple times per wk   Keep on other meds    2  HTN  COntinue meds    3.  HL  COntinue on statin  WIll need to be followed.       Signed, Dietrich PatesPaula Tamsyn Owusu, MD  11/09/2014 11:48 AM    [facility] [address], [place], [place]  [address] Phone: [phone](336) [phone]; Fax: [phone](336) [phone]   Addendum   Pt underwent myoview scanning   This showed a mild area of ischemia inferiorly  Not severe but different from previous scans  Recomm L heart cath to redfine anatomy Pt agrees to proceed.  Dietrich PatesPaula Mariza Bourget

## 2014-11-09 NOTE — Patient Instructions (Signed)
Your physician has recommended you make the following change in your medication:  1.) START LASIX 40 MG ONCE A DAY AS NEEDED FOR SWELLING--BE SURE TO EAT A BANANA OR DRINK ORANGE JUICE ON THOSE DAYS.  Your physician has requested that you have a lexiscan myoview. For further information please visit [email or web address]. Please follow instruction sheet, as given.

## 2014-11-19 ENCOUNTER — Telehealth: Payer: Self-pay | Admitting: Internal Medicine

## 2014-11-19 NOTE — Telephone Encounter (Signed)
Rec'd [facility], [facility]

## 2014-11-20 ENCOUNTER — Telehealth (HOSPITAL_COMMUNITY): Payer: Self-pay

## 2014-11-20 NOTE — Telephone Encounter (Signed)
Encounter complete. 

## 2014-11-21 ENCOUNTER — Telehealth (HOSPITAL_COMMUNITY): Payer: Self-pay

## 2014-11-21 ENCOUNTER — Ambulatory Visit (INDEPENDENT_AMBULATORY_CARE_PROVIDER_SITE_OTHER): Payer: Managed Care, Other (non HMO) | Admitting: Family Medicine

## 2014-11-21 ENCOUNTER — Encounter: Payer: Self-pay | Admitting: Family Medicine

## 2014-11-21 VITALS — BP 145/67 | HR 64 | Temp 97.9°F | Ht 63.0 in | Wt 177.1 lb

## 2014-11-21 DIAGNOSIS — R5383 Other fatigue: Secondary | ICD-10-CM

## 2014-11-21 DIAGNOSIS — I10 Essential (primary) hypertension: Secondary | ICD-10-CM | POA: Diagnosis not present

## 2014-11-21 DIAGNOSIS — R609 Edema, unspecified: Secondary | ICD-10-CM

## 2014-11-21 DIAGNOSIS — J438 Other emphysema: Secondary | ICD-10-CM

## 2014-11-21 NOTE — Patient Instructions (Signed)
  Ask Dr Illene LabradorKozlo when you see him if keep taking ranitadine and omeprazole  Finish the dulera inhaler then see how you do without taking it   Edema Swelling Take the furosemide only rarely Main treatment if elevation and compression support stockings  If Lexiscan is normal Start an exercise program and eat healthy Start slowly and gradually increase If you have chest pain or shortness of breath call us  Shingles Shot

## 2014-11-21 NOTE — Telephone Encounter (Signed)
Encounter complete. 

## 2014-11-21 NOTE — Assessment & Plan Note (Signed)
BP Readings from Last 3 Encounters:  11/21/14 145/67  11/09/14 142/82  10/31/14 125/60   Stable

## 2014-11-21 NOTE — Assessment & Plan Note (Signed)
Stable On 2 inhaled steroid agents.  Finish dulera sample and see if her symptoms change

## 2014-11-21 NOTE — Assessment & Plan Note (Signed)
Stable Using prn lasix.  Discussed elevation and compression stockings are main therapies since echo is normal

## 2014-11-21 NOTE — Progress Notes (Signed)
   Subjective:    Patient ID: Jamie Pennington, female    DOB: [birth year], 59 y.o.   MRN: [id number]  HPI  FATIGUE Improved.  Had normal echo.  Myoview pending.  Mild edema - on prn lasix.    COPD Cough much improved.  Brings in all medications and reconciled them.  Not exercising.  No fever and scant sputum   HYPERTENSION Disease Monitoring Home BP Monitoring not checking Chest pain- no    Dyspnea- no Medications Compliance-  Brings meds. Lightheadedness-  no  Edema- no ROS - See HPI  PMH Lab Review   POTASSIUM  Date Value Ref Range Status  10/31/2014 4.0 3.5 - 5.3 mEq/L Final   SODIUM  Date Value Ref Range Status  10/31/2014 139 135 - 145 mEq/L Final   CREAT  Date Value Ref Range Status  10/31/2014 0.60 0.50 - 1.10 mg/dL Final   CREATININE, SER  Date Value Ref Range Status  06/11/2014 0.66 0.40 - 1.20 mg/dL Final       Chief Complaint noted Review of Symptoms - see HPI PMH - Smoking status noted.   Vital Signs reviewed     Review of Systems     Objective:   Physical Exam Alert nad Lungs - mild crackles at bases no wheeze Heart - Regular rate and rhythm.  No murmurs, gallops or rubs.    Extrem - trace edema        Assessment & Plan:

## 2014-11-21 NOTE — Assessment & Plan Note (Signed)
Stable On 2 inhaled steroid agents.  Finish dulera sample and see if her symptoms change  

## 2014-11-21 NOTE — Assessment & Plan Note (Signed)
Improved.  Await myoview but likely deconditioning with COPD is main cause.  Discussed starting gradual exercise - she will consider

## 2014-11-22 ENCOUNTER — Ambulatory Visit (HOSPITAL_COMMUNITY)
Admission: RE | Admit: 2014-11-22 | Discharge: 2014-11-22 | Disposition: A | Discharge status: Home / Self Care | Payer: Managed Care, Other (non HMO) | Source: Ambulatory Visit | Attending: Internal Medicine | Admitting: Internal Medicine

## 2014-11-22 DIAGNOSIS — R0602 Shortness of breath: Secondary | ICD-10-CM | POA: Diagnosis not present

## 2014-11-22 DIAGNOSIS — R9439 Abnormal result of other cardiovascular function study: Secondary | ICD-10-CM | POA: Diagnosis not present

## 2014-11-22 MED ORDER — TECHNETIUM TC 99M SESTAMIBI GENERIC - CARDIOLITE
31.0000 | Freq: Once | INTRAVENOUS | Status: AC | PRN
  Administered 2014-11-22: 31 via INTRAVENOUS

## 2014-11-22 MED ORDER — REGADENOSON 0.4 MG/5ML IV SOLN
0.4000 mg | Freq: Once | INTRAVENOUS | Status: AC
  Administered 2014-11-22: 0.4 mg via INTRAVENOUS

## 2014-11-22 MED ORDER — TECHNETIUM TC 99M SESTAMIBI GENERIC - CARDIOLITE
10.3000 | Freq: Once | INTRAVENOUS | Status: AC | PRN
Start: 2014-11-22 — End: 2014-11-22
  Administered 2014-11-22: 10 via INTRAVENOUS

## 2014-11-23 LAB — MYOCARDIAL PERFUSION IMAGING
CHL CUP NUCLEAR SDS: 8
CHL CUP NUCLEAR SSS: 12
CSEPPHR: 84 {beats}/min
LV sys vol: 25 mL
LVDIAVOL: 73 mL
Rest HR: 68 {beats}/min
SRS: 4
TID: 1.17

## 2014-11-26 ENCOUNTER — Telehealth: Payer: Self-pay | Admitting: Internal Medicine

## 2014-11-26 ENCOUNTER — Other Ambulatory Visit (INDEPENDENT_AMBULATORY_CARE_PROVIDER_SITE_OTHER): Payer: Managed Care, Other (non HMO) | Admitting: *Deleted

## 2014-11-26 ENCOUNTER — Encounter: Payer: Self-pay | Admitting: *Deleted

## 2014-11-26 DIAGNOSIS — R5383 Other fatigue: Secondary | ICD-10-CM | POA: Diagnosis not present

## 2014-11-26 DIAGNOSIS — Z01810 Encounter for preprocedural cardiovascular examination: Secondary | ICD-10-CM | POA: Diagnosis not present

## 2014-11-26 DIAGNOSIS — R9439 Abnormal result of other cardiovascular function study: Secondary | ICD-10-CM

## 2014-11-26 NOTE — Telephone Encounter (Signed)
New message ° ° ° ° °Returning someone's call to get stress test results °

## 2014-11-26 NOTE — Telephone Encounter (Signed)
Pt aware of results of myoview and the need for cardiac cath.  She is scheduled for cath on Wednesday 7/20 at 8:30 am.  Left message to c/b to review instructions and when to come in for lab work.

## 2014-11-26 NOTE — Telephone Encounter (Signed)
Reviewed instructions, date and time of procedure with pt who states understanding.  Orders were placed for labs.  She will come in this afternoon or tomorrow for labs. Instructions will be left at the front desk.  She is aware to pick them up.

## 2014-11-27 LAB — BASIC METABOLIC PANEL
BUN: 17 mg/dL (ref 6–23)
CO2: 26 mEq/L (ref 19–32)
Calcium: 9.4 mg/dL (ref 8.4–10.5)
Chloride: 105 mEq/L (ref 96–112)
Creatinine, Ser: 0.72 mg/dL (ref 0.40–1.20)
GFR: 87.72 mL/min (ref >60.00)
Glucose, Bld: 125 mg/dL — ABNORMAL HIGH (ref 70–99)
Potassium: 3.8 mEq/L (ref 3.5–5.1)
Sodium: 139 mEq/L (ref 135–145)

## 2014-11-27 LAB — CBC
HCT: 45.5 % (ref 36.0–46.0)
Hemoglobin: 15.2 g/dL — ABNORMAL HIGH (ref 12.0–15.0)
MCHC: 33.4 g/dL (ref 30.0–36.0)
MCV: 85.2 fl (ref 78.0–100.0)
Platelets: 355 10*3/uL (ref 150.0–400.0)
RBC: 5.34 Mil/uL — ABNORMAL HIGH (ref 3.87–5.11)
RDW: 14.5 % (ref 11.5–15.5)
WBC: 6.9 10*3/uL (ref 4.0–10.5)

## 2014-11-27 LAB — PROTIME-INR
INR: 1 ratio (ref 0.8–1.0)
PROTHROMBIN TIME: 10.9 s (ref 9.6–13.1)

## 2014-11-28 ENCOUNTER — Encounter (HOSPITAL_COMMUNITY): Payer: Self-pay | Admitting: Cardiovascular Disease

## 2014-11-28 ENCOUNTER — Encounter (HOSPITAL_COMMUNITY)
Admission: RE | Disposition: A | Discharge status: Home / Self Care | Payer: Self-pay | Source: Ambulatory Visit | Attending: Cardiovascular Disease

## 2014-11-28 ENCOUNTER — Ambulatory Visit (HOSPITAL_COMMUNITY)
Admission: RE | Admit: 2014-11-28 | Discharge: 2014-11-28 | Disposition: A | Discharge status: Home / Self Care | Payer: Managed Care, Other (non HMO) | Source: Ambulatory Visit | Attending: Cardiovascular Disease | Admitting: Cardiovascular Disease

## 2014-11-28 DIAGNOSIS — Z87891 Personal history of nicotine dependence: Secondary | ICD-10-CM | POA: Diagnosis not present

## 2014-11-28 DIAGNOSIS — I2510 Atherosclerotic heart disease of native coronary artery without angina pectoris: Secondary | ICD-10-CM | POA: Insufficient documentation

## 2014-11-28 DIAGNOSIS — I10 Essential (primary) hypertension: Secondary | ICD-10-CM | POA: Insufficient documentation

## 2014-11-28 DIAGNOSIS — E785 Hyperlipidemia, unspecified: Secondary | ICD-10-CM | POA: Diagnosis not present

## 2014-11-28 DIAGNOSIS — Z955 Presence of coronary angioplasty implant and graft: Secondary | ICD-10-CM | POA: Insufficient documentation

## 2014-11-28 DIAGNOSIS — I252 Old myocardial infarction: Secondary | ICD-10-CM | POA: Diagnosis not present

## 2014-11-28 DIAGNOSIS — I2583 Coronary atherosclerosis due to lipid rich plaque: Secondary | ICD-10-CM | POA: Diagnosis not present

## 2014-11-28 DIAGNOSIS — Z7982 Long term (current) use of aspirin: Secondary | ICD-10-CM | POA: Insufficient documentation

## 2014-11-28 DIAGNOSIS — J449 Chronic obstructive pulmonary disease, unspecified: Secondary | ICD-10-CM | POA: Diagnosis not present

## 2014-11-28 DIAGNOSIS — R9439 Abnormal result of other cardiovascular function study: Secondary | ICD-10-CM | POA: Insufficient documentation

## 2014-11-28 HISTORY — PX: CARDIAC CATHETERIZATION: SHX172

## 2014-11-28 SURGERY — LEFT HEART CATH AND CORONARY ANGIOGRAPHY
Anesthesia: LOCAL

## 2014-11-28 MED ORDER — ASPIRIN 81 MG PO CHEW
81.0000 mg | CHEWABLE_TABLET | ORAL | Status: AC
  Administered 2014-11-28: 81 mg via ORAL

## 2014-11-28 MED ORDER — ASPIRIN EC 81 MG PO TBEC
81.0000 mg | DELAYED_RELEASE_TABLET | Freq: Every day | ORAL | Status: DC
  Filled 2014-11-28: qty 1

## 2014-11-28 MED ORDER — SODIUM CHLORIDE 0.9 % WEIGHT BASED INFUSION: 3.0000 mL/kg/h | INTRAVENOUS | Status: DC

## 2014-11-28 MED ORDER — HEPARIN SODIUM (PORCINE) 1000 UNIT/ML IJ SOLN
INTRAMUSCULAR | Status: AC
  Filled 2014-11-28: qty 1

## 2014-11-28 MED ORDER — SODIUM CHLORIDE 0.9 % IJ SOLN: 3.0000 mL | Freq: Two times a day (BID) | INTRAMUSCULAR | Status: DC

## 2014-11-28 MED ORDER — SODIUM CHLORIDE 0.9 % IV SOLN: INTRAVENOUS | Status: DC

## 2014-11-28 MED ORDER — NITROGLYCERIN 1 MG/10 ML FOR IR/CATH LAB
INTRA_ARTERIAL | Status: DC | PRN
  Administered 2014-11-28: 10:00:00

## 2014-11-28 MED ORDER — HEPARIN (PORCINE) IN NACL 2-0.9 UNIT/ML-% IJ SOLN
INTRAMUSCULAR | Status: AC
  Filled 2014-11-28: qty 1500

## 2014-11-28 MED ORDER — FENTANYL CITRATE (PF) 100 MCG/2ML IJ SOLN
INTRAMUSCULAR | Status: DC | PRN
  Administered 2014-11-28: 50 ug via INTRAVENOUS

## 2014-11-28 MED ORDER — LIDOCAINE HCL (PF) 1 % IJ SOLN
INTRAMUSCULAR | Status: AC
  Filled 2014-11-28: qty 30

## 2014-11-28 MED ORDER — SODIUM CHLORIDE 0.9 % IJ SOLN: 3.0000 mL | INTRAMUSCULAR | Status: DC | PRN

## 2014-11-28 MED ORDER — ASPIRIN 81 MG PO CHEW
CHEWABLE_TABLET | ORAL | Status: AC
  Filled 2014-11-28: qty 1

## 2014-11-28 MED ORDER — HEPARIN SODIUM (PORCINE) 1000 UNIT/ML IJ SOLN
INTRAMUSCULAR | Status: DC | PRN
Start: 2014-11-28 — End: 2014-11-28
  Administered 2014-11-28: 3500 [IU] via INTRAVENOUS

## 2014-11-28 MED ORDER — SODIUM CHLORIDE 0.9 % IV SOLN: 250.0000 mL | INTRAVENOUS | Status: DC | PRN

## 2014-11-28 MED ORDER — VERAPAMIL HCL 2.5 MG/ML IV SOLN
INTRAVENOUS | Status: AC
  Filled 2014-11-28: qty 2

## 2014-11-28 MED ORDER — ONDANSETRON HCL 4 MG/2ML IJ SOLN: 4.0000 mg | Freq: Four times a day (QID) | INTRAMUSCULAR | Status: DC | PRN

## 2014-11-28 MED ORDER — MIDAZOLAM HCL 2 MG/2ML IJ SOLN
INTRAMUSCULAR | Status: AC
  Filled 2014-11-28: qty 2

## 2014-11-28 MED ORDER — DIAZEPAM 5 MG PO TABS: 5.0000 mg | ORAL_TABLET | Freq: Four times a day (QID) | ORAL | Status: DC | PRN

## 2014-11-28 MED ORDER — FENTANYL CITRATE (PF) 100 MCG/2ML IJ SOLN
INTRAMUSCULAR | Status: AC
  Filled 2014-11-28: qty 2

## 2014-11-28 MED ORDER — ACETAMINOPHEN 325 MG PO TABS
ORAL_TABLET | ORAL | Status: AC
  Filled 2014-11-28: qty 2

## 2014-11-28 MED ORDER — ACETAMINOPHEN 325 MG PO TABS
650.0000 mg | ORAL_TABLET | ORAL | Status: DC | PRN
  Administered 2014-11-28: 650 mg via ORAL

## 2014-11-28 MED ORDER — RADIAL COCKTAIL (HEPARIN/VERAPAMIL/LIDOCAINE/NITRO)
Status: DC | PRN
  Administered 2014-11-28: 1 via INTRA_ARTERIAL

## 2014-11-28 MED ORDER — NITROGLYCERIN 1 MG/10 ML FOR IR/CATH LAB
INTRA_ARTERIAL | Status: AC
  Filled 2014-11-28: qty 10

## 2014-11-28 MED ORDER — MIDAZOLAM HCL 2 MG/2ML IJ SOLN
INTRAMUSCULAR | Status: DC | PRN
  Administered 2014-11-28: 2 mg via INTRAVENOUS

## 2014-11-28 MED ORDER — IOHEXOL 300 MG/ML  SOLN
INTRAMUSCULAR | Status: DC | PRN
  Administered 2014-11-28: 100 mL via INTRA_ARTERIAL

## 2014-11-28 SURGICAL SUPPLY — 13 items
CATH INFINITI 5 FR JL3.5 (CATHETERS) ×2 IMPLANT
CATH INFINITI 5FR ANG PIGTAIL (CATHETERS) ×3 IMPLANT
CATH LAUNCHER 5F RADL (CATHETERS) IMPLANT
CATH OPTITORQUE TIG 4.0 5F (CATHETERS) ×3 IMPLANT
CATHETER LAUNCHER 5F RADL (CATHETERS) ×3
DEVICE RAD COMP TR BAND LRG (VASCULAR PRODUCTS) ×3 IMPLANT
GLIDESHEATH SLEND A-KIT 6F 22G (SHEATH) ×3 IMPLANT
KIT HEART LEFT (KITS) ×3 IMPLANT
PACK CARDIAC CATHETERIZATION (CUSTOM PROCEDURE TRAY) ×3 IMPLANT
SYR MEDRAD MARK V 150ML (SYRINGE) ×3 IMPLANT
TRANSDUCER W/STOPCOCK (MISCELLANEOUS) ×3 IMPLANT
TUBING CIL FLEX 10 FLL-RA (TUBING) ×3 IMPLANT
WIRE SAFE-T 1.5MM-J .035X260CM (WIRE) ×3 IMPLANT

## 2014-11-28 NOTE — H&P (View-Only) (Signed)
Cardiology Office Note   Date:  11/09/2014   ID:  Jamie Pennington, DOB 1956-05-08, MRN [id number]  PCP:  Carney Living, MD  Cardiologist:   Dietrich Pates, MD   No chief complaint on file.     History of Present Illness: Jamie Pennington is a 59 y.o. female with a history of CAD    She was followed by Ronnald Nian in past   She has a history of CAD, s/p IWMI with stent to the RCA in 2004. Residual CAD to the LCx. She also has a history of HTN This year she has a few bouts of pneumonia  Last was in [place] She was seen by Dr Juanna Cao  With SOB  Treated for COPD exacerbation  He noticed lower extremity swelling    Daughter says that pt is moving slower  Looks like aged     Current Outpatient Prescriptions  Medication Sig Dispense Refill  . albuterol (PROVENTIL HFA;VENTOLIN HFA) 108 (90 BASE) MCG/ACT inhaler Inhale 6 puffs into the lungs every 6 (six) hours as needed. x48 hours then use 2 puffs every 6 hours as needed 1 Inhaler 0  . amLODipine (NORVASC) 5 MG tablet Take 1 tablet (5 mg total) by mouth daily. 30 tablet 11  . aspirin 81 MG tablet Take 81 mg by mouth daily.    Marland Kitchen atorvastatin (LIPITOR) 40 MG tablet TAKE 1 TABLET BY MOUTH EVERY DAY 30 tablet 6  . calcium-vitamin D (OSCAL WITH D 500-200) 500-200 MG-UNIT per tablet Take 1 tablet by mouth 2 (two) times daily.      . cetirizine (ZYRTEC) 10 MG tablet Take 10 mg by mouth daily as needed for allergies. Pt states that she rotates between claritin and zyrtec.    Marland Kitchen diclofenac (VOLTAREN) 75 MG EC tablet Take 75 mg by mouth 2 (two) times daily.    . Fluticasone Furoate 100 MCG/ACT AEPB Inhale 1-2 puffs into the lungs 2 (two) times daily.    . Fluticasone Furoate-Vilanterol (BREO ELLIPTA) 100-25 MCG/INH AEPB Inhale 1 puff into the lungs as directed.    Marland Kitchen losartan (COZAAR) 100 MG tablet Take 0.5 tablets (50 mg total) by mouth daily. 30 tablet 12  . metoprolol tartrate (LOPRESSOR) 25 MG tablet TAKE 1 TABLET BY MOUTH TWICE DAILY 60 tablet 3    . mometasone (NASONEX) 50 MCG/ACT nasal spray Place 1 spray into the nose daily.    . mometasone-formoterol (DULERA) 200-5 MCG/ACT AERO Inhale 2 puffs into the lungs 2 (two) times daily. 1 Inhaler 0  . Multiple Vitamin (MULTIVITAMIN) tablet Take 1 tablet by mouth daily.      . nitroGLYCERIN (NITROSTAT) 0.4 MG SL tablet Place 1 tablet (0.4 mg total) under the tongue every 5 (five) minutes as needed. 25 tablet 3  . omeprazole (PRILOSEC) 40 MG capsule Take 40 mg by mouth daily.    . ranitidine (ZANTAC) 300 MG tablet Take 300 mg by mouth at bedtime.     No current facility-administered medications for this visit.    Allergies:   Review of patient's allergies indicates no known allergies.   Past Medical History  Diagnosis Date  . Myocardial infarct     hx of . s/p stent placement in 2004  . Hypertension   . Hyperlipidemia   . Coronary artery disease     Past Surgical History  Procedure Laterality Date  . Knee surgery  2010    left  . Cardiac catheterization  2004  . Angioplasty  with stent  placement to the right coronary.  . Breast biopsy      right  . Cotton osteotomy with bone graft Right 11/24/2012    @ [facility]  . Plantar endoscopic fasciotomy Right 11/24/2012    @ [facility]     Social History:  The patient  reports that she quit smoking about 16 years ago. She has never used smokeless tobacco. She reports that she does not drink alcohol or use illicit drugs.   Family History:  The patient's family history includes Alzheimer's disease (age of onset: 2990) in her mother; Heart attack in her sister; Stroke (age of onset: 5760) in her father.    ROS:  Please see the history of present illness. All other systems are reviewed and  Negative to the above problem except as noted.    PHYSICAL EXAM: VS:  BP 142/82 mmHg  Pulse 68  Ht 5\' 3"  (1.6 m)  Wt 178 lb 6.4 oz (80.922 kg)  BMI 31.61 kg/m2  SpO2 97%  GEN: Well nourished, well developed, in no acute distress HEENT: normal Neck: no  JVD, carotid bruits, or masses Cardiac: RRR; no murmurs, rubs, or gallops,no edema  Respiratory:  Some decreased airflow   GI: soft, nontender, nondistended, + BS  No hepatomegaly  MS: no deformity Moving all extremities   Skin: warm and dry, no rash Neuro:  Strength and sensation are intact Psych: euthymic mood, full affect   EKG:  EKG is not  ordered today.   Lipid Panel    Component Value Date/Time   CHOL 172 06/11/2014 1022   TRIG 200.0* 06/11/2014 1022   HDL 45.90 06/11/2014 1022   CHOLHDL 4 06/11/2014 1022   VLDL 40.0 06/11/2014 1022   LDLCALC 86 06/11/2014 1022   LDLDIRECT 99 08/02/2012 0917      Wt Readings from Last 3 Encounters:  11/09/14 178 lb 6.4 oz (80.922 kg)  10/31/14 175 lb 12.8 oz (79.742 kg)  10/26/14 173 lb 14.4 oz (78.881 kg)      ASSESSMENT AND PLAN: 1  Dyspnea  SOB most likely due to COPD  But I would set up for lexiscan moview to r/o ischemia as cause On exam she has some sl LE edema  Can use lasix 40 a couple times per wk   Keep on other meds    2  HTN  COntinue meds    3.  HL  COntinue on statin  WIll need to be followed.       Signed, Dietrich PatesPaula , MD  11/09/2014 11:48 AM    [facility] [address], [place], [place]  [address] Phone: [phone]([phone]; Fax: [phone]) [phone]   Addendum   Pt underwent myoview scanning   This showed a mild area of ischemia inferiorly  Not severe but different from previous scans  Recomm L heart cath to redfine anatomy Pt agrees to proceed.  Dietrich PatesPaula

## 2014-11-28 NOTE — Interval H&P Note (Signed)
Cath Lab Visit (complete for each Cath Lab visit)  Clinical Evaluation Leading to the Procedure:   ACS: No.  Non-ACS:    Anginal Classification: CCS II  Anti-ischemic medical therapy: Maximal Therapy (2 or more classes of medications)  Non-Invasive Test Results: Low-risk stress test findings: cardiac mortality <1%/year  Prior CABG: No previous CABG      History and Physical Interval Note:  11/28/2014 8:58 AM  Jamie Pennington  has presented today for surgery, with the diagnosis of abn myoview    sob  The various methods of treatment have been discussed with the patient and family. After consideration of risks, benefits and other options for treatment, the patient has consented to  Procedure(s): Left Heart Cath and Coronary Angiography (N/A) as a surgical intervention .  The patient's history has been reviewed, patient examined, no change in status, stable for surgery.  I have reviewed the patient's chart and labs.  Questions were answered to the patient's satisfaction.     Dinora Hemm A

## 2014-11-28 NOTE — Discharge Instructions (Signed)
Radial Site Care °Refer to this sheet in the next few weeks. These instructions provide you with information on caring for yourself after your procedure. Your caregiver may also give you more specific instructions. Your treatment has been planned according to current medical practices, but problems sometimes occur. Call your caregiver if you have any problems or questions after your procedure. °HOME CARE INSTRUCTIONS °· You may shower the day after the procedure. Remove the bandage (dressing) and gently wash the site with plain soap and water. Gently pat the site dry. °· Do not apply powder or lotion to the site. °· Do not submerge the affected site in water for 3 to 5 days. °· Inspect the site at least twice daily. °· Do not flex or bend the affected arm for 24 hours. °· No lifting over 5 pounds (2.3 kg) for 5 days after your procedure. °· Do not drive home if you are discharged the same day of the procedure. Have someone else drive you. °· You may drive 24 hours after the procedure unless otherwise instructed by your caregiver. °· Do not operate machinery or power tools for 24 hours. °· A responsible adult should be with you for the first 24 hours after you arrive home. °What to expect: °· Any bruising will usually fade within 1 to 2 weeks. °· Blood that collects in the tissue (hematoma) may be painful to the touch. It should usually decrease in size and tenderness within 1 to 2 weeks. °SEEK IMMEDIATE MEDICAL CARE IF: °· You have unusual pain at the radial site. °· You have redness, warmth, swelling, or pain at the radial site. °· You have drainage (other than a small amount of blood on the dressing). °· You have chills. °· You have a fever or persistent symptoms for more than 72 hours. °· You have a fever and your symptoms suddenly get worse. °· Your arm becomes pale, cool, tingly, or numb. °· You have heavy bleeding from the site. Hold pressure on the site. °Document Released: 05/30/2010 Document Revised:  07/20/2011 Document Reviewed: 05/30/2010 °Exit[employer]® Patient Information ©2015 [employer]. This information is not intended to replace advice given to you by your health care provider. Make sure you discuss any questions you have with your health care provider. ° °

## 2015-01-29 ENCOUNTER — Other Ambulatory Visit: Payer: Self-pay | Admitting: Internal Medicine

## 2015-01-30 ENCOUNTER — Ambulatory Visit (INDEPENDENT_AMBULATORY_CARE_PROVIDER_SITE_OTHER): Payer: Managed Care, Other (non HMO) | Admitting: Family Medicine

## 2015-01-30 ENCOUNTER — Encounter: Payer: Self-pay | Admitting: Family Medicine

## 2015-01-30 VITALS — BP 148/72 | HR 94 | Temp 98.2°F | Wt 179.0 lb

## 2015-01-30 DIAGNOSIS — J438 Other emphysema: Secondary | ICD-10-CM

## 2015-01-30 DIAGNOSIS — I10 Essential (primary) hypertension: Secondary | ICD-10-CM

## 2015-01-30 DIAGNOSIS — J441 Chronic obstructive pulmonary disease with (acute) exacerbation: Secondary | ICD-10-CM | POA: Diagnosis not present

## 2015-01-30 MED ORDER — AZITHROMYCIN 250 MG PO TABS: 250.0000 mg | ORAL_TABLET | Freq: Every day | ORAL | Status: DC

## 2015-01-30 MED ORDER — ALBUTEROL SULFATE (2.5 MG/3ML) 0.083% IN NEBU
2.5000 mg | INHALATION_SOLUTION | Freq: Once | RESPIRATORY_TRACT | Status: AC
  Administered 2015-01-30: 2.5 mg via RESPIRATORY_TRACT

## 2015-01-30 MED ORDER — PREDNISONE 50 MG PO TABS: ORAL_TABLET | ORAL | Status: DC

## 2015-01-30 MED ORDER — ALBUTEROL SULFATE (2.5 MG/3ML) 0.083% IN NEBU: 2.5000 mg | INHALATION_SOLUTION | Freq: Four times a day (QID) | RESPIRATORY_TRACT | Status: DC | PRN

## 2015-01-30 NOTE — Assessment & Plan Note (Signed)
-   Initial 172/67, improved to 148/72 prior to discharge - Continue current management - Continue to monitor

## 2015-01-30 NOTE — Patient Instructions (Signed)
Thank you so much for coming to see [place] today! We will give you an albuterol treatment here in the office today. I will prescribe a short course of antibiotics and steroids for your COPD exacerbation. If your symptoms worsen, please schedule another appointment to be seen or go to Urgent Care or ED if we are unavailable. Please use your albuterol when needed. Please schedule an appointment at the beginning of next week so we can ensure you are improving. If your symptoms resolve prior to the appointment, you may call and cancel.  Thanks again! Dr. Caroleen Hamman

## 2015-01-30 NOTE — Progress Notes (Signed)
Subjective:     Patient ID: Jamie Pennington, female   DOB: 06/10/1955, 59 y.o.   MRN: [id number]  HPI Jamie Pennington is a 59yo female presenting today for cough and shortness of breath x3 days. - Currently prescribed Albuterol, Dulera, and Ellipta for her COPD. States she has been taking these as prescribed. - Has had multiple episodes of pneumonia over last year. States this does not feel like those episodes, but instead feels like prior COPD exacerbations. States sometimes her exacerbations proceed into pneumonia. - Sometimes exacerbations improved with steroids and antibiotics, but sometimes her exacerbation continues to progress and requires hospitalization. Unsure which antibiotics are normally helpful for her.  - Denies fevers, chills - Cough is making it hard to sleep at night - Also notes wheeze and sore throat - Notes worsening shortness of breath with exertion. Denies symptoms at rest. - No sputum production with cough - Has congestion with occasional rhinorrhea - BP consistent with prior, slightly above goal. History of HTN. - Smoking status discussed  Review of Systems Per HPI    Objective:   Physical Exam  Constitutional: She appears well-developed and well-nourished. No distress.  HENT:  Head: Normocephalic and atraumatic.  Cardiovascular: Normal rate and regular rhythm.  Exam reveals no gallop and no friction rub.   No murmur heard. Pulmonary/Chest: Effort normal. No respiratory distress.  Mild wheezing prior to albuterol treatment. No wheezing noted after treatment.  Musculoskeletal: Normal range of motion. She exhibits no edema.  Neurological: She is alert.      Assessment and Plan:     COPD (chronic obstructive pulmonary disease) - Mild exacerbation today - O2 saturation 97% - Prednisone burst x5 days - Azithromycin - Discussed presenting to clinic, urgent care, or ED if shortness of breath acutely worsens - Follow up next week to monitor continued improvement.  May cancel if symptoms resolve.   HYPERTENSION, BENIGN SYSTEMIC - Initial 172/67, improved to 148/72 prior to discharge - Continue current management - Continue to monitor

## 2015-01-30 NOTE — Assessment & Plan Note (Addendum)
-   Mild exacerbation today - O2 saturation 97% - Prednisone burst x5 days - Azithromycin - Discussed presenting to clinic, urgent care, or ED if shortness of breath acutely worsens - Follow up next week to monitor continued improvement. May cancel if symptoms resolve.

## 2015-02-04 ENCOUNTER — Ambulatory Visit: Payer: Managed Care, Other (non HMO) | Admitting: Family Medicine

## 2015-02-26 ENCOUNTER — Other Ambulatory Visit: Payer: Self-pay | Admitting: Internal Medicine

## 2015-03-04 ENCOUNTER — Other Ambulatory Visit: Payer: Self-pay | Admitting: Internal Medicine

## 2015-03-12 ENCOUNTER — Ambulatory Visit: Payer: Managed Care, Other (non HMO) | Admitting: Family Medicine

## 2015-03-13 ENCOUNTER — Ambulatory Visit (INDEPENDENT_AMBULATORY_CARE_PROVIDER_SITE_OTHER): Payer: Managed Care, Other (non HMO) | Admitting: Family Medicine

## 2015-03-13 ENCOUNTER — Encounter: Payer: Self-pay | Admitting: Family Medicine

## 2015-03-13 VITALS — BP 124/78 | HR 64 | Temp 97.8°F | Ht 63.0 in | Wt 182.0 lb

## 2015-03-13 DIAGNOSIS — R109 Unspecified abdominal pain: Secondary | ICD-10-CM

## 2015-03-13 DIAGNOSIS — R1031 Right lower quadrant pain: Secondary | ICD-10-CM | POA: Diagnosis not present

## 2015-03-13 LAB — POCT URINALYSIS DIPSTICK
Bilirubin, UA: NEGATIVE
Blood, UA: NEGATIVE
Glucose, UA: NEGATIVE
Ketones, UA: NEGATIVE
NITRITE UA: NEGATIVE
Protein, UA: NEGATIVE
Spec Grav, UA: 1.02
Urobilinogen, UA: 0.2
pH, UA: 6

## 2015-03-13 LAB — POCT UA - MICROSCOPIC ONLY

## 2015-03-13 NOTE — Patient Instructions (Signed)
Thank you so much for coming to visit me today! I suspect your abdominal pain should start to improve over the next several days. If it does not improve in the next 1-2 weeks, please schedule a follow up appointment. You may take Tylenol and use a heating pad for the pain.   Thanks again! Dr. Caroleen Hammanumley  Abdominal Pain, Adult Many things can cause abdominal pain. Usually, abdominal pain is not caused by a disease and will improve without treatment. It can often be observed and treated at home. Your health care provider will do a physical exam and possibly order blood tests and X-rays to help determine the seriousness of your pain. However, in many cases, more time must pass before a clear cause of the pain can be found. Before that point, your health care provider may not know if you need more testing or further treatment. HOME CARE INSTRUCTIONS Monitor your abdominal pain for any changes. The following actions may help to alleviate any discomfort you are experiencing:  Only take over-the-counter or prescription medicines as directed by your health care provider.  Do not take laxatives unless directed to do so by your health care provider.  Try a clear liquid diet (broth, tea, or water) as directed by your health care provider. Slowly move to a bland diet as tolerated. SEEK MEDICAL CARE IF:  You have unexplained abdominal pain.  You have abdominal pain associated with nausea or diarrhea.  You have pain when you urinate or have a bowel movement.  You experience abdominal pain that wakes you in the night.  You have abdominal pain that is worsened or improved by eating food.  You have abdominal pain that is worsened with eating fatty foods.  You have a fever. SEEK IMMEDIATE MEDICAL CARE IF:  Your pain does not go away within 2 hours.  You keep throwing up (vomiting).  Your pain is felt only in portions of the abdomen, such as the right side or the left lower portion of the  abdomen.  You pass bloody or black tarry stools. MAKE SURE YOU:  Understand these instructions.  Will watch your condition.  Will get help right away if you are not doing well or get worse.   This information is not intended to replace advice given to you by your health care provider. Make sure you discuss any questions you have with your health care provider.   Document Released: 02/04/2005 Document Revised: 01/16/2015 Document Reviewed: 01/04/2013 Elsevier Interactive Patient Education [employer]2016 Elsevier Inc.

## 2015-03-13 NOTE — Progress Notes (Signed)
Subjective:     Patient ID: Jamie Pennington, female   DOB: 04/28/1956, 59 y.o.   MRN: [id number]  HPI [name] is a 59yo female presenting today for abdominal pain. - Notes RUQ and Flank pain x 2days - Denies history of prior - Also notes nausea, diarrhea. Diarrhea resolved. - Denies fever, vomiting, constipation, dysurea, frequency, or urgency - States pain seems worse at night when she is trying to sleep - Has been using Tylenol PRN   Review of Systems Per HPI    Objective:   Physical Exam  Constitutional: She appears well-developed and well-nourished. No distress.  Cardiovascular: Normal rate and regular rhythm.  Exam reveals no gallop and no friction rub.   No murmur heard. Pulmonary/Chest: Effort normal. No respiratory distress. She has no wheezes. She has no rales.  Abdominal: Soft. Bowel sounds are normal. She exhibits no distension. There is no rebound.  Negative Rovsing's. No CVA tenderness. Negative Murphy's. Pain in RLQ but not at McBurney's. Negative Psoas and Obturator signs.  Musculoskeletal: She exhibits no edema.  Skin: No rash noted.  Psychiatric: She has a normal mood and affect. Her behavior is normal.       Assessment and Plan:     Abdominal pain - Suspect Viral etiology given nausea and diarrhea. Anticipate improvement given improvement of diarrhea - No signs of acute surgical abdomen. - Tylenol PRN. Heating pad. - Follow up if fails to improve in 1-2 weeks. Call if significantly worsens

## 2015-03-13 NOTE — Assessment & Plan Note (Signed)
-   Suspect Viral etiology given nausea and diarrhea. Anticipate improvement given improvement of diarrhea - No signs of acute surgical abdomen. - Tylenol PRN. Heating pad. - Follow up if fails to improve in 1-2 weeks. Call if significantly worsens

## 2015-04-16 ENCOUNTER — Telehealth: Payer: Self-pay | Admitting: Internal Medicine

## 2015-04-16 NOTE — Telephone Encounter (Signed)
New Message    Does pt need labs with upcoming appt? No orders in Epic, Please call back and advise.

## 2015-04-16 NOTE — Telephone Encounter (Signed)
Pt should have BMET, CBC and lipid panel prior to next visit

## 2015-04-16 NOTE — Telephone Encounter (Signed)
LVMTCB

## 2015-04-16 NOTE — Telephone Encounter (Signed)
Patient wants to know if she needs any lab done prior to next appointment Please advise Next office visit 06/2015

## 2015-04-17 ENCOUNTER — Other Ambulatory Visit: Payer: Self-pay | Admitting: *Deleted

## 2015-04-17 DIAGNOSIS — I10 Essential (primary) hypertension: Secondary | ICD-10-CM

## 2015-04-17 DIAGNOSIS — E785 Hyperlipidemia, unspecified: Secondary | ICD-10-CM

## 2015-04-26 ENCOUNTER — Other Ambulatory Visit: Payer: Self-pay | Admitting: Allergy and Immunology

## 2015-05-26 ENCOUNTER — Other Ambulatory Visit: Payer: Self-pay | Admitting: Internal Medicine

## 2015-05-28 ENCOUNTER — Ambulatory Visit (INDEPENDENT_AMBULATORY_CARE_PROVIDER_SITE_OTHER): Payer: Managed Care, Other (non HMO) | Admitting: *Deleted

## 2015-05-28 ENCOUNTER — Telehealth: Payer: Self-pay | Admitting: *Deleted

## 2015-05-28 DIAGNOSIS — Z23 Encounter for immunization: Secondary | ICD-10-CM | POA: Diagnosis not present

## 2015-05-28 NOTE — Telephone Encounter (Signed)
Called patient to offer flu vaccine. Scheduled patient to receive flu vaccine on today at 2 PM. DUCATTE, Nickola Major, RN

## 2015-06-19 NOTE — Progress Notes (Signed)
Cardiology Office Note   Date:  06/20/2015   ID:  Jamie Pennington, DOB Jan 23, 1956, MRN [id number]  PCP:  Carney Living, MD  Cardiologist:   Dietrich Pates, MD   F/U of CAD     History of Present Illness: Jamie Pennington is a 60 y.o. female with a history ofCAD She was followed by Ronnald Nian in past  She has a history of CAD, s/p IWMI with stent to the RCA in 2004. Residual CAD to the LCx. She also has a history of HTN  I saw the pt in July 2016  Since seen she denie CP  Breathing is OK though she does get SOB with stairs       Current Outpatient Prescriptions  Medication Sig Dispense Refill  . albuterol (PROVENTIL HFA;VENTOLIN HFA) 108 (90 BASE) MCG/ACT inhaler Inhale 6 puffs into the lungs every 6 (six) hours as needed. x48 hours then use 2 puffs every 6 hours as needed 1 Inhaler 0  . amLODipine (NORVASC) 5 MG tablet Take 1 tablet (5 mg total) by mouth daily. 30 tablet 11  . aspirin 81 MG tablet Take 81 mg by mouth daily.    Marland Kitchen atorvastatin (LIPITOR) 40 MG tablet TAKE 1 TABLET BY MOUTH EVERY DAY 30 tablet 1  . azithromycin (ZITHROMAX) 250 MG tablet Take 1 tablet (250 mg total) by mouth daily. 6 tablet 0  . calcium-vitamin D (OSCAL WITH D 500-200) 500-200 MG-UNIT per tablet Take 1 tablet by mouth 2 (two) times daily.      . cetirizine (ZYRTEC) 10 MG tablet Take 10 mg by mouth daily as needed for allergies. Pt states that she rotates between claritin and zyrtec.    Marland Kitchen diclofenac (VOLTAREN) 75 MG EC tablet Take 75 mg by mouth 2 (two) times daily.    . Fluticasone Furoate 100 MCG/ACT AEPB Inhale 1-2 puffs into the lungs 2 (two) times daily.    Marland Kitchen losartan (COZAAR) 100 MG tablet Take 100 mg by mouth daily.    . metoprolol tartrate (LOPRESSOR) 25 MG tablet TAKE 1 TABLET BY MOUTH TWICE DAILY 60 tablet 8  . mometasone (NASONEX) 50 MCG/ACT nasal spray Place 1 spray into the nose daily.    . Multiple Vitamin (MULTIVITAMIN) tablet Take 1 tablet by mouth daily.      . nitroGLYCERIN  (NITROSTAT) 0.4 MG SL tablet Place 1 tablet (0.4 mg total) under the tongue every 5 (five) minutes as needed. 25 tablet 3  . omeprazole (PRILOSEC) 40 MG capsule Take 40 mg by mouth daily.    . ranitidine (ZANTAC) 300 MG tablet TAKE 1 TABLET BY MOUTH EVERY EVENING AS DIRECTED FOR REFLUX 30 tablet 3   No current facility-administered medications for this visit.    Allergies:   Review of patient's allergies indicates no known allergies.   Past Medical History  Diagnosis Date  . Myocardial infarct (HCC)     hx of . s/p stent placement in 2004  . Hypertension   . Hyperlipidemia   . Coronary artery disease     Past Surgical History  Procedure Laterality Date  . Knee surgery  2010    left  . Cardiac catheterization  2004  . Angioplasty      with stent  placement to the right coronary.  . Breast biopsy      right  . Cotton osteotomy with bone graft Right 11/24/2012    @ [facility]  . Plantar endoscopic fasciotomy Right 11/24/2012    @ [facility]  . Cardiac  catheterization N/A 11/28/2014    Procedure: Left Heart Cath and Coronary Angiography;  Surgeon: Lennette Bihari, MD;  Location: [facility] INVASIVE CV LAB;  Service: Cardiovascular;  Laterality: N/A;     Social History:  The patient  reports that she quit smoking about 17 years ago. She has never used smokeless tobacco. She reports that she does not drink alcohol or use illicit drugs.   Family History:  The patient's family history includes Alzheimer's disease (age of onset: 63) in her mother; Heart attack in her sister; Stroke (age of onset: 43) in her father.    ROS:  Please see the history of present illness. All other systems are reviewed and  Negative to the above problem except as noted.    PHYSICAL EXAM: VS:  BP 130/74 mmHg  Pulse 73  Wt 184 lb (83.462 kg)  SpO2 96%  GEN: Well nourished, well developed, in no acute distress HEENT: normal Neck: no JVD, carotid bruits, or masses Cardiac: RRR; no murmurs, rubs, or gallops,no edema    Respiratory:  clear to auscultation bilaterally, normal work of breathing GI: soft, nontender, nondistended, + BS  No hepatomegaly  MS: no deformity Moving all extremities   Skin: warm and dry, no rash Neuro:  Strength and sensation are intact Psych: euthymic mood, full affect   EKG:  EKG is not ordered today.   Lipid Panel    Component Value Date/Time   CHOL 172 06/11/2014 1022   TRIG 200.0* 06/11/2014 1022   HDL 45.90 06/11/2014 1022   CHOLHDL 4 06/11/2014 1022   VLDL 40.0 06/11/2014 1022   LDLCALC 86 06/11/2014 1022   LDLDIRECT 99 08/02/2012 0917      Wt Readings from Last 3 Encounters:  06/20/15 184 lb (83.462 kg)  03/13/15 182 lb (82.555 kg)  01/30/15 179 lb (81.194 kg)      ASSESSMENT AND PLAN:  1  CAD  No symptoms to sugg angina  Keep on same regimen  2.  HL  Check lipids today   3  HTN  Adequate control  Check BMET and CBC  Encouraged her to increase her activy  At least 15 min of mod activity per day    F/U in November     Current medicines are reviewed at length with the patient today.  The patient does not have concerns regarding medicines.  The following changes have been made:   Labs/ tests ordered today include: No orders of the defined types were placed in this encounter.     Disposition:   FU with  in   Signed, Dietrich Pates, MD  06/20/2015 8:30 AM    [facility] [address] [place], [place], [place]  [address] Phone: [phone]; Fax: [phone]

## 2015-06-20 ENCOUNTER — Encounter: Payer: Self-pay | Admitting: Internal Medicine

## 2015-06-20 ENCOUNTER — Ambulatory Visit (INDEPENDENT_AMBULATORY_CARE_PROVIDER_SITE_OTHER): Payer: Managed Care, Other (non HMO) | Admitting: Internal Medicine

## 2015-06-20 VITALS — BP 130/74 | HR 73 | Wt 184.0 lb

## 2015-06-20 DIAGNOSIS — E785 Hyperlipidemia, unspecified: Secondary | ICD-10-CM

## 2015-06-20 DIAGNOSIS — I2510 Atherosclerotic heart disease of native coronary artery without angina pectoris: Secondary | ICD-10-CM

## 2015-06-20 DIAGNOSIS — I10 Essential (primary) hypertension: Secondary | ICD-10-CM | POA: Diagnosis not present

## 2015-06-20 LAB — CBC
HCT: 42.6 % (ref 36.0–46.0)
Hemoglobin: 14.6 g/dL (ref 12.0–15.0)
MCH: 29.4 pg (ref 26.0–34.0)
MCHC: 34.3 g/dL (ref 30.0–36.0)
MCV: 85.9 fL (ref 78.0–100.0)
MPV: 8.9 fL (ref 8.6–12.4)
Platelets: 269 10*3/uL (ref 150–400)
RBC: 4.96 MIL/uL (ref 3.87–5.11)
RDW: 13.7 % (ref 11.5–15.5)
WBC: 4.6 10*3/uL (ref 4.0–10.5)

## 2015-06-20 LAB — BASIC METABOLIC PANEL
BUN: 18 mg/dL (ref 7–25)
CO2: 23 mmol/L (ref 20–31)
Calcium: 8.9 mg/dL (ref 8.6–10.4)
Chloride: 106 mmol/L (ref 98–110)
Creat: 0.64 mg/dL (ref 0.50–0.99)
Glucose, Bld: 113 mg/dL — ABNORMAL HIGH (ref 65–99)
Potassium: 4.1 mmol/L (ref 3.5–5.3)
Sodium: 137 mmol/L (ref 135–146)

## 2015-06-20 LAB — LIPID PANEL
Cholesterol: 153 mg/dL (ref 125–200)
HDL: 43 mg/dL — AB (ref >=46)
LDL Cholesterol: 86 mg/dL (ref <130)
Total CHOL/HDL Ratio: 3.6 Ratio (ref <=5.0)
Triglycerides: 119 mg/dL (ref <150)
VLDL: 24 mg/dL (ref <30)

## 2015-06-20 NOTE — Patient Instructions (Signed)
Your physician recommends that you continue on your current medications as directed. Please refer to the Current Medication list given to you today. Your physician recommends that you return for lab work TODAY (CBC, BMET, LIPIDS)  Your physician wants you to follow-up in: November, 2017 WITH DR. Tenny Craw.  You will receive a reminder letter in the mail two months in advance. If you don't receive a letter, please call our office to schedule the follow-up appointment.

## 2015-07-01 ENCOUNTER — Other Ambulatory Visit: Payer: Self-pay | Admitting: Internal Medicine

## 2015-07-01 NOTE — Telephone Encounter (Signed)
Rx request sent to pharmacy.  

## 2015-07-15 ENCOUNTER — Ambulatory Visit (INDEPENDENT_AMBULATORY_CARE_PROVIDER_SITE_OTHER): Payer: Managed Care, Other (non HMO) | Admitting: Family Medicine

## 2015-07-15 ENCOUNTER — Encounter: Payer: Self-pay | Admitting: Family Medicine

## 2015-07-15 VITALS — BP 179/74 | HR 86 | Temp 98.6°F | Wt 182.6 lb

## 2015-07-15 DIAGNOSIS — J441 Chronic obstructive pulmonary disease with (acute) exacerbation: Secondary | ICD-10-CM | POA: Diagnosis not present

## 2015-07-15 MED ORDER — DOXYCYCLINE HYCLATE 100 MG PO TABS: 100.0000 mg | ORAL_TABLET | Freq: Two times a day (BID) | ORAL | Status: AC

## 2015-07-15 MED ORDER — BENZONATATE 100 MG PO CAPS
100.0000 mg | ORAL_CAPSULE | Freq: Two times a day (BID) | ORAL | Status: DC | PRN
Start: 2015-07-15 — End: 2015-11-27

## 2015-07-15 MED ORDER — PREDNISONE 50 MG PO TABS: 50.0000 mg | ORAL_TABLET | Freq: Every day | ORAL | Status: AC

## 2015-07-15 MED ORDER — ALBUTEROL SULFATE (2.5 MG/3ML) 0.083% IN NEBU
2.5000 mg | INHALATION_SOLUTION | Freq: Once | RESPIRATORY_TRACT | Status: AC
  Administered 2015-07-15: 2.5 mg via RESPIRATORY_TRACT

## 2015-07-15 MED ORDER — IPRATROPIUM BROMIDE 0.02 % IN SOLN
0.5000 mg | Freq: Once | RESPIRATORY_TRACT | Status: AC
  Administered 2015-07-15: 0.5 mg via RESPIRATORY_TRACT

## 2015-07-15 NOTE — Progress Notes (Signed)
   Subjective:    Patient ID: [name], female    DOB: [birth year], 60 y.o.   MRN: [id number]  HPI  Patient presents for Same Day Appointment, nurse walk in clinic  CC: cough  # Cough:  Started 3 days ago  Coughing is severe, productive  No resting chest pain but painful in ribs during coughing. Pain does not radiate anywhere.  Feels short of breath at times, wheezing  Feels getting some worse  No fever  Albuterol did not help much, has tried her fluticasone inhaler as well  Never been hospitalized for COPD exacerbation ROS: no vomiting, no diarrhea, no muscle aches  Social Hx: former smoker  Review of Systems   See HPI for ROS.   Past medical history, surgical, family, and social history reviewed and updated in the EMR as appropriate.  Objective:  BP 179/74 mmHg  Pulse 86  Temp(Src) 98.6 F (37 C) (Oral)  Wt 182 lb 9.6 oz (82.827 kg)  SpO2 97% Vitals and nursing note reviewed  General: no apparent distress  CV: normal rate, regular rhythm, no murmurs, rubs or gallop. Resp: diffuse bilateral wheezes and rhonchi, no focal crackles. Normal effort. Moderate cough in exam room  Repeat resp exam after treatment with some improvement  Assessment & Plan:  1. Chronic obstructive pulmonary disease with acute exacerbation (HCC) In office duonebs treatment with mild improvement. Treat as COPD exacerbation with prednisone + doxycycline. Return precautions given. Continue home inhalers. Otherwise suggested follow up with PCP in next week for BP evaluation (significantly elevated last 2 office visits) - albuterol (PROVENTIL) (2.5 MG/3ML) 0.083% nebulizer solution 2.5 mg; Take 3 mLs (2.5 mg total) by nebulization once. - ipratropium (ATROVENT) nebulizer solution 0.5 mg; Take 2.5 mLs (0.5 mg total) by nebulization once. - prednisone 50mg  x 5 days - doxycycline 100mg  twice daily x 5 days

## 2015-07-15 NOTE — Patient Instructions (Addendum)
Take the prednisone and doxycycline for the next 5 days.  Continue your daily inhalers (fluticasone twice daily, and albuterol as needed)  If your breathing worsens you should return to clinic or go to urgent care or the ED.  You should schedule a follow up appointment with Dr. Deirdre Priesthambliss to discuss your blood pressure which was elevated both today and your last clinic visit in September.

## 2015-07-15 NOTE — Assessment & Plan Note (Signed)
Likely COPD exacerbation. SpO2 97% in clinic. Duonebs treatment in clinic. Will treat with prednisone 50mg  x 5 days and doxycycline course. Return precautions given. Recommended following up next week to also discuss blood pressure.

## 2015-07-16 ENCOUNTER — Ambulatory Visit: Payer: Managed Care, Other (non HMO) | Admitting: Family Medicine

## 2015-07-18 ENCOUNTER — Encounter: Payer: Self-pay | Admitting: Family Medicine

## 2015-07-18 ENCOUNTER — Ambulatory Visit (INDEPENDENT_AMBULATORY_CARE_PROVIDER_SITE_OTHER): Payer: Managed Care, Other (non HMO) | Admitting: Family Medicine

## 2015-07-18 ENCOUNTER — Telehealth: Payer: Self-pay | Admitting: Family Medicine

## 2015-07-18 VITALS — BP 150/90 | HR 90 | Temp 98.2°F | Ht 63.0 in | Wt 178.0 lb

## 2015-07-18 DIAGNOSIS — J441 Chronic obstructive pulmonary disease with (acute) exacerbation: Secondary | ICD-10-CM

## 2015-07-18 MED ORDER — IPRATROPIUM BROMIDE HFA 17 MCG/ACT IN AERS: 2.0000 | INHALATION_SPRAY | RESPIRATORY_TRACT | Status: DC | PRN

## 2015-07-18 NOTE — Telephone Encounter (Signed)
Please call daughter back regarding patient.  Still wheezing

## 2015-07-18 NOTE — Patient Instructions (Signed)
Use the albuterol and ipratropium inhaler scheduled at least every 4 hours, 1-2 puffs per inhaler.  Continue the doxcycline and prednisone

## 2015-07-18 NOTE — Telephone Encounter (Signed)
Return call to patient regarding wheezing.  Patient is on day 4 of Prednisone.  Patient has been using the inhaler with minimal relief.  Patient was seen on Monday by Dr. Waynetta SandyWight.  Nurse could hear patient wheezing over the phone.  Advised patient she should be seen again.  Appt this afternoon at 3:45 PM with Dr. Waynetta SandyWight.  Clovis PuMartin, Tamika L, RN

## 2015-07-20 NOTE — Assessment & Plan Note (Signed)
Continue prednisone, doxycycline for acute exacerbation. Will add ipratropium inhaler to be used alongside her albuterol. Return precautions given. Follow up not improving over next week/after finishing doxycycline

## 2015-07-20 NOTE — Progress Notes (Signed)
   Subjective:    Patient ID: Jamie Pennington, female    DOB: 1956-02-17, 60 y.o.   MRN: [id number]  HPI  Patient presents for Same Day Appointment  CC: breathing  # Wheezing:  A few days into prednisone and doxycycline  Still having some significant intermittent episodes  No fevers  Still coughing  Using inhalers as well ROS: cough, runny nose  Social Hx: former smoker  Review of Systems   See HPI for ROS.   Past medical history, surgical, family, and social history reviewed and updated in the EMR as appropriate.  Objective:  BP 150/90 mmHg  Pulse 90  Temp(Src) 98.2 F (36.8 C) (Oral)  Ht 5\' 3"  (1.6 m)  Wt 178 lb (80.74 kg)  BMI 31.54 kg/m2  SpO2 95% Vitals and nursing note reviewed  General: no apparent distress  CV: normal rate, regular rhythm, no murmurs, rubs or gallop. Resp: diffuse bilateral expiratory wheeze and rhonchi, no focal crackles, normal effort and rate  Assessment & Plan:  COPD (chronic obstructive pulmonary disease) Continue prednisone, doxycycline for acute exacerbation. Will add ipratropium inhaler to be used alongside her albuterol. Return precautions given. Follow up not improving over next week/after finishing doxycycline

## 2015-07-26 ENCOUNTER — Other Ambulatory Visit: Payer: Self-pay | Admitting: Internal Medicine

## 2015-07-26 ENCOUNTER — Other Ambulatory Visit: Payer: Self-pay | Admitting: Allergy and Immunology

## 2015-07-26 NOTE — Telephone Encounter (Signed)
Needs ov for further refills 

## 2015-08-01 ENCOUNTER — Other Ambulatory Visit: Payer: Self-pay | Admitting: Allergy and Immunology

## 2015-08-01 NOTE — Telephone Encounter (Signed)
Needs ov for further refills 

## 2015-08-06 ENCOUNTER — Other Ambulatory Visit: Payer: Self-pay | Admitting: Obstetrics & Gynecology

## 2015-08-06 DIAGNOSIS — R928 Other abnormal and inconclusive findings on diagnostic imaging of breast: Secondary | ICD-10-CM

## 2015-08-19 ENCOUNTER — Other Ambulatory Visit: Payer: Managed Care, Other (non HMO)

## 2015-08-20 ENCOUNTER — Ambulatory Visit
Admission: RE | Admit: 2015-08-20 | Discharge: 2015-08-20 | Disposition: A | Discharge status: Home / Self Care | Payer: Managed Care, Other (non HMO) | Source: Ambulatory Visit | Attending: Obstetrics & Gynecology | Admitting: Obstetrics & Gynecology

## 2015-08-20 DIAGNOSIS — R928 Other abnormal and inconclusive findings on diagnostic imaging of breast: Secondary | ICD-10-CM

## 2015-08-21 ENCOUNTER — Other Ambulatory Visit: Payer: Self-pay | Admitting: Allergy and Immunology

## 2015-08-22 ENCOUNTER — Other Ambulatory Visit: Payer: Self-pay | Admitting: Allergy and Immunology

## 2015-09-04 ENCOUNTER — Other Ambulatory Visit: Payer: Self-pay | Admitting: Allergy and Immunology

## 2015-09-09 ENCOUNTER — Other Ambulatory Visit: Payer: Self-pay | Admitting: Family Medicine

## 2015-10-04 ENCOUNTER — Other Ambulatory Visit: Payer: Self-pay | Admitting: Family Medicine

## 2015-10-04 ENCOUNTER — Other Ambulatory Visit: Payer: Self-pay | Admitting: Allergy and Immunology

## 2015-10-10 ENCOUNTER — Other Ambulatory Visit: Payer: Self-pay | Admitting: Family Medicine

## 2015-11-03 ENCOUNTER — Other Ambulatory Visit: Payer: Self-pay | Admitting: Family Medicine

## 2015-11-04 NOTE — Telephone Encounter (Signed)
Patient aware and appt made for 11-27-15. Jazmin Hartsell,CMA

## 2015-11-12 ENCOUNTER — Other Ambulatory Visit: Payer: Self-pay | Admitting: Family Medicine

## 2015-11-16 ENCOUNTER — Other Ambulatory Visit: Payer: Self-pay | Admitting: Family Medicine

## 2015-11-25 ENCOUNTER — Other Ambulatory Visit: Payer: Self-pay | Admitting: Internal Medicine

## 2015-11-27 ENCOUNTER — Ambulatory Visit (INDEPENDENT_AMBULATORY_CARE_PROVIDER_SITE_OTHER): Payer: Managed Care, Other (non HMO) | Admitting: Family Medicine

## 2015-11-27 ENCOUNTER — Encounter: Payer: Self-pay | Admitting: Family Medicine

## 2015-11-27 VITALS — BP 145/67 | HR 70 | Temp 98.6°F | Wt 175.0 lb

## 2015-11-27 DIAGNOSIS — J438 Other emphysema: Secondary | ICD-10-CM

## 2015-11-27 DIAGNOSIS — Z1159 Encounter for screening for other viral diseases: Secondary | ICD-10-CM

## 2015-11-27 DIAGNOSIS — F32A Depression, unspecified: Secondary | ICD-10-CM | POA: Insufficient documentation

## 2015-11-27 DIAGNOSIS — F329 Major depressive disorder, single episode, unspecified: Secondary | ICD-10-CM | POA: Diagnosis not present

## 2015-11-27 DIAGNOSIS — Z114 Encounter for screening for human immunodeficiency virus [HIV]: Secondary | ICD-10-CM

## 2015-11-27 DIAGNOSIS — I10 Essential (primary) hypertension: Secondary | ICD-10-CM

## 2015-11-27 LAB — TSH: TSH: 1.63 mIU/L

## 2015-11-27 MED ORDER — PAROXETINE HCL 20 MG PO TABS: 20.0000 mg | ORAL_TABLET | Freq: Every day | ORAL | Status: DC

## 2015-11-27 NOTE — Patient Instructions (Addendum)
Good to see you today!  Thanks for coming in.  For your Breathing  Keep taking all your medications as you are.  If you hve to go up on your Albuterol rescue let us know  For your Blood pressure  Take your home blood pressure regularly  If usually > 150/95 either number then call us  For your Mood  Check your insurance list and make an appointment with a counselor  Take the paxil every AM.  Call if any problems  Come back in 2-3 weeks to see how you are doing   I will call you if your lab tests are not normal.  Otherwise we will discuss them at your next visit.  Please remember to get your Mammogram

## 2015-11-27 NOTE — Assessment & Plan Note (Signed)
New.  She will investigate a psychologist and wanted to start medications.  See back in 2 weeks

## 2015-11-27 NOTE — Assessment & Plan Note (Signed)
BP Readings from Last 3 Encounters:  11/27/15 145/67  07/18/15 150/90  07/15/15 179/74   Near goal today despite new onset depression.  Will monitor

## 2015-11-27 NOTE — Progress Notes (Signed)
Subjective  Patient is presenting with the following illnesses  Depression Feeling down for a few weeks lots of family stressors she does not want to discuss in detail.  Never treated for depression before.  No alcohol.  Does have fhx of depression. PHQ9 - 12.  No SI + insomnia and decreased energy and appetite   HYPERTENSION Disease Monitoring Home BP Monitoring (Severity) not checking Symptoms - Chest pain- no    Dyspnea- no Medications(Modifying factors) Compliance-  Brought medications taking regularly. Lightheadedness-  no  Edema- no Timing - continuous  Duration - years ROS - See HPI  COPD Using albuterol only once a month. Is using other inhalers.  Mild shortness of breath with exercise.  Not much pm cough.  No edema   PMH Lab Review   POTASSIUM  Date Value Ref Range Status  06/20/2015 4.1 3.5 - 5.3 mmol/L Final   SODIUM  Date Value Ref Range Status  06/20/2015 137 135 - 146 mmol/L Final   CREAT  Date Value Ref Range Status  06/20/2015 0.64 0.50 - 0.99 mg/dL Final   CREATININE, SER  Date Value Ref Range Status  11/26/2014 0.72 0.40 - 1.20 mg/dL Final       Chief Complaint noted Review of Symptoms - see HPI PMH - Smoking status noted.     Objective Vital Signs reviewed Psych:  Cognition and judgment appear intact.Tearfull intermittenlty  Alert, communicative  and cooperative with normal attention span and concentration. No apparent delusions, illusions, hallucinations Heart - Regular rate and rhythm.  No murmurs, gallops or rubs.    Lungs:  Normal respiratory effort, chest expands symmetrically. Lungs are clear to auscultation, no crackles or wheezes. Extremities:  No cyanosis, edema, or deformity noted with good range of motion of all major joints.        Assessments/Plans  See Encounter view if individual problem A/Ps not visible See after visit summary for details of patient instuctions

## 2015-11-27 NOTE — Assessment & Plan Note (Signed)
Controlled continue current medications. 

## 2015-11-28 LAB — HIV ANTIBODY (ROUTINE TESTING W REFLEX): HIV: NONREACTIVE

## 2015-11-28 LAB — HEPATITIS C ANTIBODY: HCV AB: NEGATIVE

## 2015-12-08 ENCOUNTER — Other Ambulatory Visit: Payer: Self-pay | Admitting: Family Medicine

## 2015-12-10 ENCOUNTER — Telehealth: Payer: Self-pay | Admitting: Family Medicine

## 2015-12-10 ENCOUNTER — Other Ambulatory Visit: Payer: Self-pay | Admitting: Family Medicine

## 2015-12-10 NOTE — Telephone Encounter (Signed)
Spoke with her Doing ok Told name of psychologist Letha Cape [phone] sent to her via email Has appointment in one week

## 2015-12-15 ENCOUNTER — Other Ambulatory Visit: Payer: Self-pay | Admitting: Family Medicine

## 2015-12-18 ENCOUNTER — Ambulatory Visit: Payer: Managed Care, Other (non HMO) | Admitting: Family Medicine

## 2015-12-25 ENCOUNTER — Ambulatory Visit (INDEPENDENT_AMBULATORY_CARE_PROVIDER_SITE_OTHER): Payer: Managed Care, Other (non HMO) | Admitting: Family Medicine

## 2015-12-25 ENCOUNTER — Encounter: Payer: Self-pay | Admitting: Family Medicine

## 2015-12-25 VITALS — BP 121/59 | HR 54 | Temp 98.1°F | Wt 170.0 lb

## 2015-12-25 DIAGNOSIS — F329 Major depressive disorder, single episode, unspecified: Secondary | ICD-10-CM

## 2015-12-25 DIAGNOSIS — F32A Depression, unspecified: Secondary | ICD-10-CM

## 2015-12-25 NOTE — Assessment & Plan Note (Signed)
Stable to slight improvement.  See after visit summary Continue current medication

## 2015-12-25 NOTE — Progress Notes (Signed)
Subjective  Patient is presenting with the following illnesses  Depression tolerating SSRI well no jitteriness or rash.  Feels some better than one month ago.  Sleep a little better a little less feeling down.  Has appointment set up with counselor Jamie Pennington next week.  No suicidal ideation     Chief Complaint noted Review of Symptoms - see HPI PMH - Smoking status noted.     Objective Vital Signs reviewed Psych:  Cognition and judgment appear intact. Alert, communicative  and cooperative with normal attention span and concentration. No apparent delusions, illusions, hallucinations    Assessments/Plans  No problem-specific Assessment & Plan notes found for this encounter.   See Encounter view if individual problem A/Ps not visible See after visit summary for details of patient instuctions

## 2015-12-25 NOTE — Patient Instructions (Signed)
Good to see you today!  Thanks for coming in.  Let me know if 2-3 weeks if you are not definitely feeling better  Continue the medication and see your counselor and continue exercise  Mammogram reminder   Come back in 5-6 weeks

## 2016-01-19 ENCOUNTER — Other Ambulatory Visit: Payer: Self-pay | Admitting: Family Medicine

## 2016-01-19 DIAGNOSIS — F329 Major depressive disorder, single episode, unspecified: Secondary | ICD-10-CM

## 2016-01-19 DIAGNOSIS — F32A Depression, unspecified: Secondary | ICD-10-CM

## 2016-02-12 ENCOUNTER — Encounter: Payer: Self-pay | Admitting: Family Medicine

## 2016-02-12 ENCOUNTER — Ambulatory Visit (INDEPENDENT_AMBULATORY_CARE_PROVIDER_SITE_OTHER): Payer: Managed Care, Other (non HMO) | Admitting: Family Medicine

## 2016-02-12 DIAGNOSIS — F321 Major depressive disorder, single episode, moderate: Secondary | ICD-10-CM | POA: Diagnosis not present

## 2016-02-12 DIAGNOSIS — J438 Other emphysema: Secondary | ICD-10-CM

## 2016-02-12 DIAGNOSIS — Z23 Encounter for immunization: Secondary | ICD-10-CM | POA: Diagnosis not present

## 2016-02-12 NOTE — Progress Notes (Signed)
Subjective  Patient is presenting with the following illnesses  Depression PHQ9 unchanged.  She is seeing a Information systems managercounselor Molly at [facility].   No problems with Paxil.  Insomnia and mood she feels is come improved.  Appetite about the same she is happy to be losing weight.  Exercising 5 days per week.  No skin hair changes  COPD Not using a preventer.  Has not used rescue for weeks.  Mild shortness of breath when exercising.  No chest pain or edema or orthopnea.   Just had flu shot    Chief Complaint noted Review of Symptoms - see HPI PMH - Smoking status noted.     Objective Vital Signs reviewed Alert interactive Psych:  Cognition and judgment appear intact. Alert, communicative  and cooperative with normal attention span and concentration. No apparent delusions, illusions, hallucinations     Assessments/Plans  No problem-specific Assessment & Plan notes found for this encounter.   See Encounter view if individual problem A/Ps not visible See after visit summary for details of patient instuctions

## 2016-02-12 NOTE — Patient Instructions (Addendum)
For Breathing Take your preventer breathing inhaler every day.  If you need refills ask the pharmacy to contact me  For Depression Increase the Paxil to 2 tabs every day.  I will refill with a 40 mg tablet. Let me know if any side effects Continue the exercise and counseling  Get your mamogram   Come back in 2 months unless you are feeling great then just call.  If not feeling any better or worse in one month the call

## 2016-02-12 NOTE — Assessment & Plan Note (Signed)
Not improving by scoring but seems some better by her assessment.   Will increase Paxil and continue exercise and counseling ,  See after visit summary

## 2016-02-12 NOTE — Assessment & Plan Note (Signed)
Stable but needs to use preventer.  See after visit summary

## 2016-03-02 ENCOUNTER — Other Ambulatory Visit: Payer: Self-pay | Admitting: Family Medicine

## 2016-03-02 MED ORDER — PAROXETINE HCL 40 MG PO TABS: 40.0000 mg | ORAL_TABLET | Freq: Every day | ORAL | 4 refills | Status: DC

## 2016-03-02 NOTE — Telephone Encounter (Signed)
Daughter states pt needs a refill on paroxetine because pt was told to double the amount she was taking and the pharmacy would not fill it. Pt uses [facility] on [address] Please advise. Thanks! ep

## 2016-05-01 ENCOUNTER — Ambulatory Visit (INDEPENDENT_AMBULATORY_CARE_PROVIDER_SITE_OTHER): Payer: Managed Care, Other (non HMO) | Admitting: Obstetrics and Gynecology

## 2016-05-01 VITALS — BP 126/70 | HR 69 | Temp 98.0°F | Ht 63.0 in | Wt 162.2 lb

## 2016-05-01 DIAGNOSIS — R05 Cough: Secondary | ICD-10-CM

## 2016-05-01 DIAGNOSIS — R059 Cough, unspecified: Secondary | ICD-10-CM

## 2016-05-01 DIAGNOSIS — J029 Acute pharyngitis, unspecified: Secondary | ICD-10-CM | POA: Diagnosis not present

## 2016-05-01 NOTE — Patient Instructions (Signed)
Monitor for signs of fever, increased sputum or change in color of sputum, or shortness of breath Stay hydrated, take over the counter cold medications as needed  Upper Respiratory Infection, Adult Most upper respiratory infections (URIs) are caused by a virus. A URI affects the nose, throat, and upper air passages. The most common type of URI is often called "the common cold." Follow these instructions at home:  Take medicines only as told by your doctor.  Gargle warm saltwater or take cough drops to comfort your throat as told by your doctor.  Use a warm mist humidifier or inhale steam from a shower to increase air moisture. This may make it easier to breathe.  Drink enough fluid to keep your pee (urine) clear or pale yellow.  Eat soups and other clear broths.  Have a healthy diet.  Rest as needed.  Go back to work when your fever is gone or your doctor says it is okay.  You may need to stay home longer to avoid giving your URI to others.  You can also wear a face mask and wash your hands often to prevent spread of the virus.  Use your inhaler more if you have asthma.  Do not use any tobacco products, including cigarettes, chewing tobacco, or electronic cigarettes. If you need help quitting, ask your doctor. Contact a doctor if:  You are getting worse, not better.  Your symptoms are not helped by medicine.  You have chills.  You are getting more short of breath.  You have brown or red mucus.  You have yellow or brown discharge from your nose.  You have pain in your face, especially when you bend forward.  You have a fever.  You have puffy (swollen) neck glands.  You have pain while swallowing.  You have white areas in the back of your throat. Get help right away if:  You have very bad or constant:  Headache.  Ear pain.  Pain in your forehead, behind your eyes, and over your cheekbones (sinus pain).  Chest pain.  You have long-lasting (chronic) lung  disease and any of the following:  Wheezing.  Long-lasting cough.  Coughing up blood.  A change in your usual mucus.  You have a stiff neck.  You have changes in your:  Vision.  Hearing.  Thinking.  Mood. This information is not intended to replace advice given to you by your health care provider. Make sure you discuss any questions you have with your health care provider. Document Released: 10/14/2007 Document Revised: 12/29/2015 Document Reviewed: 08/02/2013 Elsevier Interactive Patient Education  2017 [employer]

## 2016-05-01 NOTE — Progress Notes (Signed)
   Subjective:   Patient ID: Francella SolianGail Fisher, female    DOB: [birth year], 60 y.o.   MRN: [id number]  Patient presents for Same Day Appointment  Chief Complaint  Patient presents with  . Cough  . Sore Throat    HPI: # COUGH Has been coughing for 2 days. Has taken tylenol and gargaled salt water Knows if she doesn't catch stuff early will affect copd Cough is: dry Sputum production: no Medications tried: inhalers  Symptoms Runny nose: no Wheezing or asthma: yes some Fever: no Chest Pain: no Shortness of breath: no Leg swelling: no Hemoptysis: no   SORE THROAT Gets worse at night Had "knots" on side of neck; now reolved Sore throat began 2 days ago. Strep throat exposure: no  Symptoms Muscle aches: no Trouble breathing: no Drooling: no  Review of Systems   See HPI for ROS.   History  Smoking Status  . Former Smoker  . Packs/day: 1.00  . Years: 20.00  . Quit date: 01/18/1998  Smokeless Tobacco  . Never Used    Past medical history, surgical, family, and social history reviewed and updated in the EMR as appropriate.  Objective:  BP 126/70 (BP Location: Right Arm, Patient Position: Sitting, Cuff Size: Large)   Pulse 69   Temp 98 F (36.7 C) (Oral)   Ht 5\' 3"  (1.6 m)   Wt 162 lb 3.2 oz (73.6 kg)   SpO2 98%   BMI 28.73 kg/m  Vitals and nursing note reviewed  Physical Exam  Constitutional: She is well-developed, well-nourished, and in no distress.  HENT:  Nose: Nose normal.  Mouth/Throat: Oropharynx is clear and moist and mucous membranes are normal.  Eyes: Conjunctivae and EOM are normal. Pupils are equal, round, and reactive to light.  Neck: Normal range of motion. Neck supple.  Cardiovascular: Normal rate and regular rhythm.   Pulmonary/Chest: Effort normal and breath sounds normal. She has no wheezes. She has no rales.  Lymphadenopathy:    She has no cervical adenopathy.    Assessment & Plan:  1. Cough Most likely secondary to viral URI  developing. No signs of COPD exacerbation or pneumonia. Afebrile and vitals are stable. Lung exam benign. Conservative treatment to include OTC cold medications and hydration. Continue inhalers for COPD. Return precautions dicussed. Patient education on AVS.   2. Sore throat Appears to be a viral sore throat that is going along with her cough. Exam unremarkable. No red flags. Conservative measures discussed.    Caryl AdaJazma Theophil Thivierge, DO 05/01/2016, 10:56 AM PGY-3, [facility]

## 2016-05-21 ENCOUNTER — Ambulatory Visit (INDEPENDENT_AMBULATORY_CARE_PROVIDER_SITE_OTHER): Payer: Managed Care, Other (non HMO) | Admitting: Family Medicine

## 2016-05-21 ENCOUNTER — Encounter: Payer: Self-pay | Admitting: Family Medicine

## 2016-05-21 DIAGNOSIS — J441 Chronic obstructive pulmonary disease with (acute) exacerbation: Secondary | ICD-10-CM | POA: Insufficient documentation

## 2016-05-21 MED ORDER — GUAIFENESIN-CODEINE 100-10 MG/5ML PO SOLN: 5.0000 mL | ORAL | 0 refills | Status: DC | PRN

## 2016-05-21 MED ORDER — PREDNISONE 20 MG PO TABS: 40.0000 mg | ORAL_TABLET | Freq: Every day | ORAL | 0 refills | Status: DC

## 2016-05-21 MED ORDER — ALBUTEROL SULFATE HFA 108 (90 BASE) MCG/ACT IN AERS: 6.0000 | INHALATION_SPRAY | Freq: Four times a day (QID) | RESPIRATORY_TRACT | 3 refills | Status: DC | PRN

## 2016-05-21 MED ORDER — DOXYCYCLINE HYCLATE 100 MG PO TABS: 100.0000 mg | ORAL_TABLET | Freq: Two times a day (BID) | ORAL | 0 refills | Status: DC

## 2016-05-21 NOTE — Progress Notes (Signed)
   Subjective:    Patient ID: Jamie Pennington, female    DOB: [birth year], 61 y.o.   MRN: [id number]  HPI Patient with known COPD has been sick since before Christmas.  Lots of cough.  Mild DOE.  No fever.  See note of 12/22, Dr. Doroteo GlassmanPhelps.   Initially improved after the visit but now is worsening.    Review of Systems     Objective:   Physical Exam Bilateral exp wheezes.  Scattered coarse rhonchi.  No rales.       Assessment & Plan:

## 2016-05-21 NOTE — Assessment & Plan Note (Signed)
Likely due to viral resp illness.  Now needs rx with pred and doxy.

## 2016-05-21 NOTE — Patient Instructions (Signed)
You have a COPD exacerbation I sent in three prescriptions. 1. A refill on your ventolin inhaler 2. Prednisone, a steroid 3. Doxycycline, an antibiotic I will give you a written prescription for codeine containing cough medicine. See us again on Monday if you are not significantly better.

## 2016-06-03 ENCOUNTER — Other Ambulatory Visit: Payer: Self-pay | Admitting: Internal Medicine

## 2016-06-09 ENCOUNTER — Other Ambulatory Visit: Payer: Self-pay | Admitting: Internal Medicine

## 2016-06-29 ENCOUNTER — Other Ambulatory Visit: Payer: Self-pay | Admitting: Internal Medicine

## 2016-07-13 ENCOUNTER — Other Ambulatory Visit: Payer: Self-pay | Admitting: Internal Medicine

## 2016-07-19 ENCOUNTER — Other Ambulatory Visit: Payer: Self-pay | Admitting: Internal Medicine

## 2016-07-19 ENCOUNTER — Other Ambulatory Visit: Payer: Self-pay | Admitting: Family Medicine

## 2016-07-21 ENCOUNTER — Telehealth: Payer: Self-pay | Admitting: Family Medicine

## 2016-07-21 NOTE — Telephone Encounter (Signed)
Daughter called because her mother needs to go see a chiropractor about her neck. She has somehow sprained it or something to that effect. This really bothering her and would like to see someone about this. jw

## 2016-07-22 NOTE — Telephone Encounter (Signed)
Please  Let her know  We would need to see her to determine if any imaging was needed and if chiropractic referral was advisable.  She could be seen by our [facility] doctor  Thanks  LC

## 2016-07-23 NOTE — Telephone Encounter (Signed)
LMOVM for pt to call us back. Carlen Fils, CMA  

## 2016-07-28 NOTE — Telephone Encounter (Signed)
Left another message for patient to return call.

## 2016-08-11 ENCOUNTER — Other Ambulatory Visit: Payer: Self-pay | Admitting: Internal Medicine

## 2016-08-12 ENCOUNTER — Ambulatory Visit (INDEPENDENT_AMBULATORY_CARE_PROVIDER_SITE_OTHER): Payer: Managed Care, Other (non HMO) | Admitting: Family Medicine

## 2016-08-12 ENCOUNTER — Ambulatory Visit
Admission: RE | Admit: 2016-08-12 | Discharge: 2016-08-12 | Disposition: A | Discharge status: Home / Self Care | Payer: Managed Care, Other (non HMO) | Source: Ambulatory Visit | Attending: Family Medicine | Admitting: Family Medicine

## 2016-08-12 ENCOUNTER — Encounter: Payer: Self-pay | Admitting: Family Medicine

## 2016-08-12 DIAGNOSIS — M542 Cervicalgia: Secondary | ICD-10-CM | POA: Diagnosis not present

## 2016-08-12 NOTE — Patient Instructions (Addendum)
Good to see you today!  Thanks for coming in.  Get the xray I will contact you after that  Take Tylenol three times a day every day and as needed advil  Heat and range of motion - 6 directions - twice a day three times a day   Change position every 20 min  If you have weakness or not better in 1 month come back  Come in for a regular check up in 3-4 months

## 2016-08-12 NOTE — Progress Notes (Signed)
Subjective  Patient is presenting with the following illnesses  NECK PAIN  Back pain began 4 weeks ago. Pain is described as aching on left side of neck. Patient has tried Chiropodist, tylenol and aleve. Pain radiates no. History of trauma or injury: no Prior history of similar pain: no History of cancer: no Weak immune system:  no History of IV drug use: no History of steroid use: no  Symptoms Incontinence of bowel or bladder:  no Numbness of leg: no Fever: no Rest or Night pain: no Weight Loss:  no Rash: no  ROS see HPI Smoking Status noted.    Chief Complaint noted Review of Symptoms - see HPI PMH - Smoking status noted.     Objective Vital Signs reviewed Decreased range of motion of neck in all directions Pain is worse with left motion Tender focally lateral left side of vertebrae on upper neck No deformity or bony pain Neurologic exam : Cn 2-7 intact Strength equal & normal in upper & lower extremities Able to walk on heels and toes.       Assessments/Plans  No problem-specific Assessment & Plan notes found for this encounter.   See Encounter view if individual problem A/Ps not visible See after visit summary for details of patient instuctions

## 2016-08-12 NOTE — Assessment & Plan Note (Signed)
New onset. Likely musculoskeletal  But given her age and has been for 4 weeks need imaging to rule out cancer or fracture

## 2016-08-13 ENCOUNTER — Telehealth: Payer: Self-pay | Admitting: Family Medicine

## 2016-08-13 MED ORDER — CYCLOBENZAPRINE HCL 10 MG PO TABS: 10.0000 mg | ORAL_TABLET | Freq: Every evening | ORAL | 0 refills | Status: DC | PRN

## 2016-08-13 NOTE — Telephone Encounter (Signed)
Called left vm on cell.  Ask her to call with times to talk

## 2016-08-13 NOTE — Telephone Encounter (Signed)
Told her a musculoskeletal sprain as we suspected.  Will try flexaril at pm.

## 2016-08-13 NOTE — Telephone Encounter (Signed)
Pt returned call. She will have her cell phone on her so please call when you get a chance. Sunday Spillers, CMA

## 2016-08-17 ENCOUNTER — Other Ambulatory Visit: Payer: Self-pay | Admitting: Internal Medicine

## 2016-08-19 ENCOUNTER — Other Ambulatory Visit: Payer: Self-pay | Admitting: Internal Medicine

## 2016-08-25 ENCOUNTER — Other Ambulatory Visit: Payer: Self-pay | Admitting: Family Medicine

## 2016-08-26 ENCOUNTER — Other Ambulatory Visit: Payer: Self-pay | Admitting: Family Medicine

## 2016-09-01 ENCOUNTER — Other Ambulatory Visit: Payer: Self-pay | Admitting: Internal Medicine

## 2016-09-07 ENCOUNTER — Other Ambulatory Visit: Payer: Self-pay | Admitting: Family Medicine

## 2016-09-15 ENCOUNTER — Encounter: Payer: Self-pay | Admitting: Internal Medicine

## 2016-09-21 ENCOUNTER — Other Ambulatory Visit: Payer: Self-pay | Admitting: Family Medicine

## 2016-09-30 ENCOUNTER — Other Ambulatory Visit: Payer: Self-pay | Admitting: Internal Medicine

## 2016-10-01 NOTE — Progress Notes (Signed)
Cardiology Office Note   Date:  10/02/2016   ID:  Jamie Pennington, DOB 09/16/1955, MRN [id number]  PCP:  Carney Livinghambliss, Marshall L, MD  Cardiologist:   Dietrich PatesPaula Jaylee Lantry, MD    F/U of CAD    History of Present Illness: Jamie Pennington is a 61 y.o. female with a history of CAD  I saw her in Feb 2017  S/p IWMI with stent to RCA in 2004  Residual CAD to LCx  Cath in 2016 minal CAD  Patent stent  LVEF 55 to 60^  Also a hsitory of HTN I saw her in Feb 2017    Last lipid in 2017 LDL 86    Walking  No CP  Breathing is OK  No change in her abilities to do things  Current Meds  Medication Sig  . albuterol (PROVENTIL HFA;VENTOLIN HFA) 108 (90 Base) MCG/ACT inhaler Inhale 6 puffs into the lungs every 6 (six) hours as needed. x48 hours then use 2 puffs every 6 hours as needed  . amLODipine (NORVASC) 5 MG tablet TAKE 1 TABLET BY MOUTH DAILY  . ARNUITY ELLIPTA 100 MCG/ACT AEPB USE 1 INHALATION EVERY DAY. RINSE GARGLE, AND SPIT AFTER USE  . aspirin 81 MG tablet Take 81 mg by mouth daily. Reported on 11/27/2015  . atorvastatin (LIPITOR) 40 MG tablet Take 1 tablet (40 mg total) by mouth daily. *Please call and schedule a one year follow up appointment for further refills*  . calcium-vitamin D (OSCAL WITH D 500-200) 500-200 MG-UNIT per tablet Take 1 tablet by mouth 2 (two) times daily.    . cetirizine (ZYRTEC) 10 MG tablet Take 10 mg by mouth daily as needed for allergies. Pt states that she rotates between claritin and zyrtec.  . cyclobenzaprine (FLEXERIL) 10 MG tablet Take 1 tablet (10 mg total) by mouth at bedtime as needed for muscle spasms.  Marland Kitchen. losartan (COZAAR) 50 MG tablet TAKE 1 TABLET BY MOUTH EVERY DAY  . metoprolol tartrate (LOPRESSOR) 25 MG tablet TAKE 1 TABLET BY MOUTH TWICE DAILY  . mometasone (NASONEX) 50 MCG/ACT nasal spray INSTILL 1 SPRAY INTO EASH NOSTRILL EVERY DAY FOR STUFFY NOSE OR DRAINAGE  . Multiple Vitamin (MULTIVITAMIN) tablet Take 1 tablet by mouth daily.    . nitroGLYCERIN (NITROSTAT)  0.4 MG SL tablet Place 0.4 mg under the tongue every 5 (five) minutes as needed for chest pain (MAX 3 TABLETS).  Marland Kitchen. omeprazole (PRILOSEC) 40 MG capsule TAKE 1 CAPSULE BY MOUTH EVERY MORNING FOR REFLUX  . PARoxetine (PAXIL) 40 MG tablet TAKE 1 TABLET BY MOUTH ONCE DAILY     Allergies:   Patient has no known allergies.   Past Medical History:  Diagnosis Date  . Coronary artery disease   . Hyperlipidemia   . Hypertension   . Myocardial infarct (HCC)    hx of . s/p stent placement in 2004    Past Surgical History:  Procedure Laterality Date  . ANGIOPLASTY     with stent  placement to the right coronary.  Marland Kitchen. BREAST BIOPSY     right  . CARDIAC CATHETERIZATION  2004  . CARDIAC CATHETERIZATION N/A 11/28/2014   Procedure: Left Heart Cath and Coronary Angiography;  Surgeon: Lennette Biharihomas A Kelly, MD;  [facility];  Service: Cardiovascular;  Laterality: N/A;  . Cotton Osteotomy with Bone Graft Right 11/24/2012   @ [facility]  . KNEE SURGERY  2010   left  . Plantar Endoscopic Fasciotomy Right 11/24/2012   @ [facility]  Social History:  The patient  reports that she quit smoking about 18 years ago. She has a 20.00 pack-year smoking history. She has never used smokeless tobacco. She reports that she does not drink alcohol or use drugs.   Family History:  The patient's family history includes Alzheimer's disease (age of onset: 35) in her mother; Heart attack in her sister; Stroke (age of onset: 44) in her father.    ROS:  Please see the history of present illness. All other systems are reviewed and  Negative to the above problem except as noted.    PHYSICAL EXAM: VS:  BP 130/78   Pulse (!) 59   Ht 5\' 3"  (1.6 m)   Wt 165 lb 6.4 oz (75 kg)   BMI 29.30 kg/m   GEN: Well nourished, well developed, in no acute distress  HEENT: normal  Neck: no JVD, carotid bruits, or masses Cardiac: RRR; no murmurs, rubs, or gallops,no edema  Respiratory:  clear to auscultation bilaterally, normal work of  breathing GI: soft, nontender, nondistended, + BS  No hepatomegaly  MS: no deformity Moving all extremities   Skin: warm and dry, no rash Neuro:  Strength and sensation are intact Psych: euthymic mood, full affect   EKG:  EKG is ordered today.  SB 59 bpm     Lipid Panel    Component Value Date/Time   CHOL 153 06/20/2015 0849   TRIG 119 06/20/2015 0849   HDL 43 (L) 06/20/2015 0849   CHOLHDL 3.6 06/20/2015 0849   VLDL 24 06/20/2015 0849   LDLCALC 86 06/20/2015 0849   LDLDIRECT 99 08/02/2012 0917      Wt Readings from Last 3 Encounters:  10/02/16 165 lb 6.4 oz (75 kg)  08/12/16 164 lb 6.4 oz (74.6 kg)  05/21/16 163 lb (73.9 kg)      ASSESSMENT AND PLAN:  1  CAD  NO symptoms to sugg angina  Keep on current regimen  Stay active  Will check CBC and BMET today  2  HTN  BP is OK  Check BMET  3   HL  Check lpids    F/U in 1 year     Current medicines are reviewed at length with the patient today.  The patient does not have concerns regarding medicines.  Signed, Dietrich Pates, MD  10/02/2016 8:17 AM    [facility] [address] [place], [place], [place]  [address] Phone: [phone]; Fax: [phone]

## 2016-10-02 ENCOUNTER — Ambulatory Visit (INDEPENDENT_AMBULATORY_CARE_PROVIDER_SITE_OTHER): Payer: Managed Care, Other (non HMO) | Admitting: Internal Medicine

## 2016-10-02 ENCOUNTER — Encounter: Payer: Self-pay | Admitting: Internal Medicine

## 2016-10-02 VITALS — BP 130/78 | HR 59 | Ht 63.0 in | Wt 165.4 lb

## 2016-10-02 DIAGNOSIS — E785 Hyperlipidemia, unspecified: Secondary | ICD-10-CM | POA: Diagnosis not present

## 2016-10-02 DIAGNOSIS — I2510 Atherosclerotic heart disease of native coronary artery without angina pectoris: Secondary | ICD-10-CM

## 2016-10-02 NOTE — Patient Instructions (Signed)
Your physician recommends that you continue on your current medications as directed. Please refer to the Current Medication list given to you today.  Your physician recommends that you return for lab work in: today (CBC, BMET, LIPIDS)  Your physician wants you to follow-up in: 1 YEAR WITH DR. Tenny CrawOSS.  You will receive a reminder letter in the mail two months in advance. If you don't receive a letter, please call our office to schedule the follow-up appointment.

## 2016-10-03 LAB — CBC
HEMATOCRIT: 44.6 % (ref 34.0–46.6)
HEMOGLOBIN: 14.5 g/dL (ref 11.1–15.9)
MCH: 28.5 pg (ref 26.6–33.0)
MCHC: 32.5 g/dL (ref 31.5–35.7)
MCV: 88 fL (ref 79–97)
Platelets: 328 10*3/uL (ref 150–379)
RBC: 5.09 x10E6/uL (ref 3.77–5.28)
RDW: 14.3 % (ref 12.3–15.4)
WBC: 5.7 10*3/uL (ref 3.4–10.8)

## 2016-10-03 LAB — BASIC METABOLIC PANEL
BUN/Creatinine Ratio: 33 — ABNORMAL HIGH (ref 12–28)
BUN: 20 mg/dL (ref 8–27)
CALCIUM: 9.1 mg/dL (ref 8.7–10.3)
CO2: 22 mmol/L (ref 18–29)
Chloride: 104 mmol/L (ref 96–106)
Creatinine, Ser: 0.6 mg/dL (ref 0.57–1.00)
GFR calc Af Amer: 113 mL/min/{1.73_m2} (ref >59)
GFR calc non Af Amer: 98 mL/min/{1.73_m2} (ref >59)
GLUCOSE: 104 mg/dL — AB (ref 65–99)
POTASSIUM: 4.7 mmol/L (ref 3.5–5.2)
Sodium: 141 mmol/L (ref 134–144)

## 2016-10-03 LAB — LIPID PANEL
CHOL/HDL RATIO: 3.2 ratio (ref 0.0–4.4)
Cholesterol, Total: 179 mg/dL (ref 100–199)
HDL: 56 mg/dL (ref >39)
LDL CALC: 106 mg/dL — AB (ref 0–99)
Triglycerides: 83 mg/dL (ref 0–149)
VLDL Cholesterol Cal: 17 mg/dL (ref 5–40)

## 2016-10-08 ENCOUNTER — Telehealth: Payer: Self-pay | Admitting: *Deleted

## 2016-10-08 DIAGNOSIS — E785 Hyperlipidemia, unspecified: Secondary | ICD-10-CM

## 2016-10-08 MED ORDER — EZETIMIBE 10 MG PO TABS: 10.0000 mg | ORAL_TABLET | Freq: Every day | ORAL | 3 refills | Status: DC

## 2016-10-08 NOTE — Telephone Encounter (Signed)
-----   Message from Dietrich PatesPaula Ross V, MD sent at 10/03/2016  9:35 AM EDT ----- CBC normal Electrolytes and kidney funciton are OK Lipids were better 1 year ago Is she taking lipitor every day  If so I would recommend adding Zetia to regimen F/U lipids in 8 wks

## 2016-10-11 ENCOUNTER — Other Ambulatory Visit: Payer: Self-pay | Admitting: Internal Medicine

## 2016-10-26 ENCOUNTER — Other Ambulatory Visit: Payer: Self-pay | Admitting: Internal Medicine

## 2016-12-03 ENCOUNTER — Other Ambulatory Visit: Payer: Self-pay | Admitting: Family Medicine

## 2016-12-08 ENCOUNTER — Other Ambulatory Visit: Payer: Managed Care, Other (non HMO)

## 2016-12-08 DIAGNOSIS — E785 Hyperlipidemia, unspecified: Secondary | ICD-10-CM

## 2016-12-08 LAB — LIPID PANEL
CHOL/HDL RATIO: 2.4 ratio (ref 0.0–4.4)
Cholesterol, Total: 134 mg/dL (ref 100–199)
HDL: 56 mg/dL (ref >39)
LDL Calculated: 56 mg/dL (ref 0–99)
Triglycerides: 108 mg/dL (ref 0–149)
VLDL CHOLESTEROL CAL: 22 mg/dL (ref 5–40)

## 2016-12-12 ENCOUNTER — Other Ambulatory Visit: Payer: Self-pay | Admitting: Family Medicine

## 2016-12-26 ENCOUNTER — Telehealth: Payer: Self-pay | Admitting: Internal Medicine

## 2016-12-26 NOTE — Telephone Encounter (Signed)
**  After Hours/ Emergency Line Call*  Received a call from Francella Solian' daughter to report that patient is having a deep, hacking cough that started with a cold. Daughter states that she is worried that patient is having a COPD exacerbation. She often gets COPD exacerbations that start with a cold and then travel to her lungs. Whenever she gets like this, she receives steroid medications that knock out the cough. Daughter is wondering if she can get these over the phone or if patient needs to be seen somewhere. No chest pain, no shortness of breath.   I recommended that patient be seen at urgent care, so that we can rule out other causes of cough such as pneumonia. Think it is also a good idea for her oxygen saturation to be checked. Daughter voiced understanding and states that she will bring her to a doctor to be seen today.  Will forward to PCP.  Hilton Sinclair, MD PGY-3, [facility]

## 2016-12-27 ENCOUNTER — Other Ambulatory Visit: Payer: Self-pay | Admitting: Family Medicine

## 2016-12-27 DIAGNOSIS — F32A Depression, unspecified: Secondary | ICD-10-CM

## 2016-12-27 DIAGNOSIS — F329 Major depressive disorder, single episode, unspecified: Secondary | ICD-10-CM

## 2017-01-04 ENCOUNTER — Encounter: Payer: Self-pay | Admitting: Family Medicine

## 2017-03-19 ENCOUNTER — Other Ambulatory Visit: Payer: Self-pay | Admitting: Family Medicine

## 2017-04-04 ENCOUNTER — Other Ambulatory Visit: Payer: Self-pay | Admitting: Family Medicine

## 2017-04-25 ENCOUNTER — Other Ambulatory Visit: Payer: Self-pay | Admitting: Family Medicine

## 2017-06-04 ENCOUNTER — Ambulatory Visit: Payer: Managed Care, Other (non HMO) | Admitting: Family Medicine

## 2017-06-04 VITALS — BP 118/80 | HR 76 | Temp 98.3°F | Ht 63.0 in | Wt 183.8 lb

## 2017-06-04 DIAGNOSIS — J441 Chronic obstructive pulmonary disease with (acute) exacerbation: Secondary | ICD-10-CM | POA: Diagnosis not present

## 2017-06-04 MED ORDER — ALBUTEROL SULFATE (2.5 MG/3ML) 0.083% IN NEBU
2.5000 mg | INHALATION_SOLUTION | Freq: Once | RESPIRATORY_TRACT | Status: AC
  Administered 2017-06-04: 2.5 mg via RESPIRATORY_TRACT

## 2017-06-04 MED ORDER — IPRATROPIUM BROMIDE 0.02 % IN SOLN
0.5000 mg | Freq: Once | RESPIRATORY_TRACT | Status: AC
  Administered 2017-06-04: 0.5 mg via RESPIRATORY_TRACT

## 2017-06-04 MED ORDER — PREDNISONE 50 MG PO TABS: 50.0000 mg | ORAL_TABLET | Freq: Every day | ORAL | 0 refills | Status: DC

## 2017-06-04 MED ORDER — GUAIFENESIN-CODEINE 100-10 MG/5ML PO SOLN: 10.0000 mL | Freq: Three times a day (TID) | ORAL | 0 refills | Status: DC | PRN

## 2017-06-04 MED ORDER — DOXYCYCLINE HYCLATE 100 MG PO TABS: 100.0000 mg | ORAL_TABLET | Freq: Two times a day (BID) | ORAL | 0 refills | Status: AC

## 2017-06-04 NOTE — Progress Notes (Signed)
   Subjective:   Patient ID: Jamie Pennington    DOB: Jul 20, 1955, 62 y.o. female   MRN: [id number]  CC: cough, dyspnea on exertion   HPI: Jamie SolianGail Pennington is a 62 y.o. female who presents to clinic today for cough and mild dyspnea  Cough/dyspnea Patient states her symptoms first started out like a viral URI but have now worsened.  She has had cold-like symptoms over the last 3 weeks.  Reports a lot of coughing which is sometimes productive of thick yellow sputum.  Still endorsing mild dyspnea on exertion.  She has had to take her inhaler several times within a day.  Denies any leg swelling.  No palpitations, shortness of breath at rest, or chest pain.    ROS: Denies fever, chills, nausea, vomiting, abdominal pain.  Denies SOB or CP.  Social: former smoker, quit 2000  Medications reviewed. Objective:   BP 118/80 (BP Location: Left Arm, Patient Position: Sitting, Cuff Size: Normal)   Pulse 76   Temp 98.3 F (36.8 C) (Oral)   Ht 5\' 3"  (1.6 m)   Wt 183 lb 12.8 oz (83.4 kg)   SpO2 94%   BMI 32.56 kg/m  Vitals and nursing note reviewed.  General: 62 year old female, NAD HEENT: Rhinorrhea present, MMM, oropharynx is clear Neck: supple CV: RRR no MRG  Lungs: coarse rhonchi, expiratory wheezes heard bilaterally, normal effort of breathing Skin: warm, dry, no rashes or lesions Extremities: no edema noted   Assessment & Plan:   COPD exacerbation (HCC) Cough and mild dyspnea on exertion likely related to COPD exacerbation.  O2 sats are stable at 94% on room air.  Vitals are within normal.  Lung exam notable for coarse rhonchi and expiratory wheeze bilaterally.  -Breathing treatment x1 given in office with some improvement in symptoms -Have prescribed doxycycline and prednisone; additionally, prescribed guaifenesin-codeine syrup for cough -Refill on Ventolin inhaler offered, patient states she has enough at home plan -discussed return to clinic for follow-up early next week if no improvement  in symptoms -Return precautions discussed  Meds ordered this encounter  Medications  . guaiFENesin-codeine 100-10 MG/5ML syrup    Sig: Take 10 mLs by mouth 3 (three) times daily as needed for cough.    Dispense:  120 mL    Refill:  0  . albuterol (PROVENTIL) (2.5 MG/3ML) 0.083% nebulizer solution 2.5 mg  . ipratropium (ATROVENT) nebulizer solution 0.5 mg  . doxycycline (VIBRA-TABS) 100 MG tablet    Sig: Take 1 tablet (100 mg total) by mouth 2 (two) times daily for 7 days.    Dispense:  14 tablet    Refill:  0  . predniSONE (DELTASONE) 50 MG tablet    Sig: Take 1 tablet (50 mg total) by mouth daily with breakfast.    Dispense:  5 tablet    Refill:  0   Follow up: If symptoms worsen or do not improve  Freddrick MarchYashika Dody Smartt, MD [facility], PGY-2 06/10/2017 11:09 AM

## 2017-06-04 NOTE — Assessment & Plan Note (Addendum)
Cough and mild dyspnea on exertion likely related to COPD exacerbation.  O2 sats are stable at 94% on room air.  Vitals are within normal.  Lung exam notable for coarse rhonchi and expiratory wheeze bilaterally.  -Breathing treatment x1 given in office with some improvement in symptoms -Have prescribed doxycycline and prednisone; additionally, prescribed guaifenesin-codeine syrup for cough -Refill on Ventolin inhaler offered, patient states she has enough at home plan -discussed return to clinic for follow-up early next week if no improvement in symptoms -Return precautions discussed

## 2017-06-04 NOTE — Patient Instructions (Addendum)
You were seen in clinic for cough and congestion and are likely having a mild COPD exacerbation.  We gave you a breathing treatment while you were in the office which helped.  I have ordered a steroid and an antibiotic for you to take.  Please pick this up from your pharmacy and take as directed.  I have also ordered a cough syrup with codeine for you which may help your symptoms.  If you do not get better within the next 2 days or you feel your symptoms are worsening, I would like for you to be seen by a provider again.   Be well, Freddrick MarchYashika Delayne Sanzo, MD

## 2017-06-16 ENCOUNTER — Encounter: Payer: Self-pay | Admitting: Family Medicine

## 2017-06-16 ENCOUNTER — Ambulatory Visit: Payer: Managed Care, Other (non HMO) | Admitting: Family Medicine

## 2017-06-16 DIAGNOSIS — Z23 Encounter for immunization: Secondary | ICD-10-CM | POA: Diagnosis not present

## 2017-06-16 DIAGNOSIS — S39011A Strain of muscle, fascia and tendon of abdomen, initial encounter: Secondary | ICD-10-CM

## 2017-06-16 DIAGNOSIS — J441 Chronic obstructive pulmonary disease with (acute) exacerbation: Secondary | ICD-10-CM

## 2017-06-16 MED ORDER — BENZONATATE 100 MG PO CAPS: 100.0000 mg | ORAL_CAPSULE | Freq: Two times a day (BID) | ORAL | 1 refills | Status: DC | PRN

## 2017-06-16 MED ORDER — FLUTICASONE FUROATE 100 MCG/ACT IN AEPB: 1.0000 | INHALATION_SPRAY | Freq: Every day | RESPIRATORY_TRACT | 6 refills | Status: DC

## 2017-06-16 NOTE — Patient Instructions (Addendum)
Good to see you today!  Thanks for coming in.  Use Tylenol arthti for pain  If any gi symptoms or fever or not better in 6-8 weeks then let me know  Use the fluticasone inhaler every day  Use tessalon perles for cough.  You should be improved in 6-8 weeks

## 2017-06-16 NOTE — Assessment & Plan Note (Signed)
Exam and history consistent with this.  No signs of intraabdominal issues.  See after visit summary

## 2017-06-16 NOTE — Assessment & Plan Note (Signed)
Some improvement.  Try tessalon for cough and gave her prognosis - see after visit summary

## 2017-06-16 NOTE — Progress Notes (Signed)
Subjective  Patient is presenting with the following illnesses  COPD EXACERBATION Still coughing but much better.  No fever or and sputum is clear.  Nose feels congested.  Not using her fluticasone again - ran out   ABDOMINAL PAIN  Pain began several  days ago when was coughing Is worse with bending or moving her abdomen  Medications tried: tylenol Similar pain before:no  Symptoms Nausea/vomiting: no Diarrhea: no Constipation: no Blood in stool: no Blood in vomit: no Fever: no Dysuria: no Loss of appetite: no Weight loss: no  Vaginal Bleeding: no Missed menstrual period: no  Review of Symptoms - see HPI PMH - Smoking status noted.     Chief Complaint noted Review of Symptoms - see HPI PMH - Smoking status noted.     Objective Vital Signs reviewed Alert nad Lungs:  Normal respiratory effort, chest expands symmetrically. Lungs have scant wheeze without crackles Abdomen: soft and non-tender without masses, organomegaly or hernias noted.  No guarding or rebound Tenderness is reproduced by raising heels and direct palpation over left lower and upper abdomen No CVAT .Skin:  Intact without suspicious lesions or rashes      Assessments/Plans  COPD exacerbation (HCC) Some improvement.  Try tessalon for cough and gave her prognosis - see after visit summary   Strain of rectus abdominis muscle Exam and history consistent with this.  No signs of intraabdominal issues.  See after visit summary    See after visit summary for details of patient instuctions

## 2017-06-24 ENCOUNTER — Other Ambulatory Visit: Payer: Self-pay | Admitting: Family Medicine

## 2017-06-28 ENCOUNTER — Other Ambulatory Visit: Payer: Self-pay | Admitting: Family Medicine

## 2017-07-20 ENCOUNTER — Encounter: Payer: Self-pay | Admitting: Podiatry

## 2017-07-20 ENCOUNTER — Ambulatory Visit: Payer: Managed Care, Other (non HMO) | Admitting: Podiatry

## 2017-07-20 DIAGNOSIS — M21962 Unspecified acquired deformity of left lower leg: Secondary | ICD-10-CM | POA: Diagnosis not present

## 2017-07-20 DIAGNOSIS — M216X2 Other acquired deformities of left foot: Secondary | ICD-10-CM

## 2017-07-20 DIAGNOSIS — M722 Plantar fascial fibromatosis: Secondary | ICD-10-CM

## 2017-07-20 NOTE — Progress Notes (Signed)
Subjective: 62 y.o. year old female patient presents complaining of Pain in left foot. Most pain in in the morning when getting out of bed. Pain in bottom of left heel x 2 weeks. On feet 4 -5 hours a day. Does not do exercise.   Medical history: Hypertension, Coronary artery disease, COPD, Carpal tunnel syndrome, Depression. Surgical history: July 2014 TAL and Cotton osteotomy with bone graft on right.  Objective: Dermatologic: Normal findings. Vascular: Pedal pulses are all palpable. Orthopedic: Hypermobile first ray with compensatory ankle pronation left foot. Neurologic: All epicritic and tactile sensations grossly intact.  Assessment: Plantar fasciitis left. Hypermobile first ray left. Compensatory pronation left ankle.  Treatment: Left heel injected with mixture of 4 mg Dexamethasone, 4 mg Triamcinolone, and 1 cc of 0.5% Marcaine plain. Patient tolerated well without difficulty.  Night Splint dispensed x 1. Instructed to bring old Programmer, multimediarthotics.

## 2017-07-20 NOTE — Patient Instructions (Signed)
Seen for pain in left heel. Cortisone injection given. Bring old Programmer, multimediarthotics. Will request for approval on a new pair orthotics. Return in 2 weeks.

## 2017-08-03 ENCOUNTER — Ambulatory Visit: Payer: Managed Care, Other (non HMO) | Admitting: Podiatry

## 2017-08-03 ENCOUNTER — Encounter: Payer: Self-pay | Admitting: Podiatry

## 2017-08-03 DIAGNOSIS — M216X2 Other acquired deformities of left foot: Secondary | ICD-10-CM

## 2017-08-03 DIAGNOSIS — M21962 Unspecified acquired deformity of left lower leg: Secondary | ICD-10-CM | POA: Diagnosis not present

## 2017-08-03 DIAGNOSIS — M722 Plantar fascial fibromatosis: Secondary | ICD-10-CM

## 2017-08-03 NOTE — Progress Notes (Signed)
Subjective: 62 year old female presents for 2 week follow up on Cortisone injection given in left plantar heel.  No pain on left heel until this morning.  Patient brought in worn out orthotics that are 63 years old. . Medical history: Hypertension, Coronary artery disease, COPD, Carpal tunnel syndrome, Depression. Surgical history: July 2014 TAL and Cotton osteotomy with bone graft on right.  Objective: Dermatologic: Normal findings. Vascular: Pedal pulses are all palpable. Orthopedic: Hypermobile first ray with compensatory ankle pronation left foot. Improved left heel pain. Normal ankle joint flexion and extension. Neurologic: All epicritic and tactile sensations grossly intact.  Assessment: (Improved Plantar fasciitis left. Hypermobile first ray left. Compensatory pronation left ankle.  Treatment: Continue with Night Splint. May benefit from a new pair orthotics. Increase physical activity as tolerated. Will contact insurance company for Chief Financial Officerrthotic coverage info.

## 2017-08-03 NOTE — Patient Instructions (Addendum)
Follow up on injection on left heel. Making progress. Continue with regular activity as tolerated. May benefit from a new pair orthotics.

## 2017-08-06 ENCOUNTER — Telehealth: Payer: Self-pay | Admitting: *Deleted

## 2017-08-09 NOTE — Telephone Encounter (Signed)
(  Reference # [id number]) called Jamie Pouchaetna does not cover CPT code L3020. Called patient and informed of this and left her know self pay price is $300. Patient states she will call back to schedule appt to get orthotics done

## 2017-08-24 ENCOUNTER — Other Ambulatory Visit: Payer: Self-pay | Admitting: Family Medicine

## 2017-09-07 ENCOUNTER — Ambulatory Visit (INDEPENDENT_AMBULATORY_CARE_PROVIDER_SITE_OTHER): Payer: Managed Care, Other (non HMO) | Admitting: Podiatry

## 2017-09-07 DIAGNOSIS — M216X2 Other acquired deformities of left foot: Secondary | ICD-10-CM

## 2017-09-07 DIAGNOSIS — M21962 Unspecified acquired deformity of left lower leg: Secondary | ICD-10-CM

## 2017-09-07 DIAGNOSIS — M722 Plantar fascial fibromatosis: Secondary | ICD-10-CM | POA: Diagnosis not present

## 2017-09-07 MED ORDER — OXYCODONE-ACETAMINOPHEN 10-325 MG PO TABS: 1.0000 | ORAL_TABLET | Freq: Four times a day (QID) | ORAL | 0 refills | Status: DC | PRN

## 2017-09-07 NOTE — Progress Notes (Signed)
Subjective: 62 y.o. year old female patient presents complaining of Pain in left foot duration of over a month..  Pain was bad last week. Patient request for new orthotics.  Most pain in in the morning when getting out of bed. On feet 4 -5 hours a day. Does not do exercise.   Medical history: Hypertension, Coronary artery disease, COPD, Carpal tunnel syndrome, Depression. Surgical history: July 2014 TAL and Cotton osteotomy with bone graft on right.  Objective: Dermatologic: Normal findings. Vascular: Pedal pulses are all palpable. Orthopedic: Hypermobile first ray with compensatory ankle pronation left foot. Neurologic: All epicritic and tactile sensations grossly intact.  Assessment: Plantar fasciitis left. Hypermobile first ray left. Compensatory pronation left ankle.  Treatment: Both feet casted for Orthotics. Pain medication prescribed. Patient will return for injection if needed.

## 2017-09-07 NOTE — Patient Instructions (Signed)
Seen for plantar fasciitis. Both feet casted for orthotics. Pain medication prescribed.

## 2017-09-08 ENCOUNTER — Encounter: Payer: Self-pay | Admitting: Podiatry

## 2017-09-22 ENCOUNTER — Other Ambulatory Visit: Payer: Self-pay | Admitting: *Deleted

## 2017-09-22 MED ORDER — METOPROLOL TARTRATE 25 MG PO TABS: 25.0000 mg | ORAL_TABLET | Freq: Two times a day (BID) | ORAL | 0 refills | Status: DC

## 2017-09-23 NOTE — Telephone Encounter (Signed)
close

## 2017-09-27 ENCOUNTER — Other Ambulatory Visit: Payer: Self-pay | Admitting: Internal Medicine

## 2017-09-28 ENCOUNTER — Telehealth: Payer: Self-pay

## 2017-09-28 ENCOUNTER — Other Ambulatory Visit: Payer: Self-pay | Admitting: Family Medicine

## 2017-09-28 NOTE — Telephone Encounter (Signed)
Pt notified that orthotics are ready for pickup. 

## 2017-09-29 ENCOUNTER — Other Ambulatory Visit: Payer: Self-pay | Admitting: Internal Medicine

## 2017-09-29 MED ORDER — EZETIMIBE 10 MG PO TABS: 10.0000 mg | ORAL_TABLET | Freq: Every day | ORAL | 0 refills | Status: DC

## 2017-09-29 NOTE — Telephone Encounter (Signed)
This was already reordered today in another encounter.

## 2017-10-20 ENCOUNTER — Encounter: Payer: Self-pay | Admitting: Cardiology

## 2017-10-20 ENCOUNTER — Ambulatory Visit (INDEPENDENT_AMBULATORY_CARE_PROVIDER_SITE_OTHER): Payer: 59 | Admitting: Cardiology

## 2017-10-20 VITALS — BP 112/68 | HR 61 | Ht 63.0 in | Wt 184.0 lb

## 2017-10-20 DIAGNOSIS — I2510 Atherosclerotic heart disease of native coronary artery without angina pectoris: Secondary | ICD-10-CM

## 2017-10-20 DIAGNOSIS — I10 Essential (primary) hypertension: Secondary | ICD-10-CM

## 2017-10-20 DIAGNOSIS — E785 Hyperlipidemia, unspecified: Secondary | ICD-10-CM | POA: Insufficient documentation

## 2017-10-20 LAB — BASIC METABOLIC PANEL
BUN/Creatinine Ratio: 38 — ABNORMAL HIGH (ref 12–28)
BUN: 21 mg/dL (ref 8–27)
CALCIUM: 9 mg/dL (ref 8.7–10.3)
CHLORIDE: 106 mmol/L (ref 96–106)
CO2: 23 mmol/L (ref 20–29)
Creatinine, Ser: 0.56 mg/dL — ABNORMAL LOW (ref 0.57–1.00)
GFR calc Af Amer: 115 mL/min/{1.73_m2} (ref >59)
GFR calc non Af Amer: 100 mL/min/{1.73_m2} (ref >59)
GLUCOSE: 101 mg/dL — AB (ref 65–99)
POTASSIUM: 4.4 mmol/L (ref 3.5–5.2)
Sodium: 142 mmol/L (ref 134–144)

## 2017-10-20 LAB — LIPID PANEL
CHOL/HDL RATIO: 3.6 ratio (ref 0.0–4.4)
CHOLESTEROL TOTAL: 155 mg/dL (ref 100–199)
HDL: 43 mg/dL (ref >39)
LDL CALC: 83 mg/dL (ref 0–99)
TRIGLYCERIDES: 144 mg/dL (ref 0–149)
VLDL Cholesterol Cal: 29 mg/dL (ref 5–40)

## 2017-10-20 LAB — CBC
HEMATOCRIT: 41.8 % (ref 34.0–46.6)
HEMOGLOBIN: 14.2 g/dL (ref 11.1–15.9)
MCH: 29.5 pg (ref 26.6–33.0)
MCHC: 34 g/dL (ref 31.5–35.7)
MCV: 87 fL (ref 79–97)
Platelets: 327 10*3/uL (ref 150–450)
RBC: 4.81 x10E6/uL (ref 3.77–5.28)
RDW: 14.4 % (ref 12.3–15.4)
WBC: 5.7 10*3/uL (ref 3.4–10.8)

## 2017-10-20 NOTE — Progress Notes (Signed)
Cardiology Office Note:    Date:  10/20/2017   ID:  Francella Solian, DOB [birth year], MRN [id number]  PCP:  Carney Living, MD  Cardiologist:  Dietrich Pates, MD  Referring MD: Carney Living, *   Chief Complaint  Patient presents with  . Follow-up    CAD    History of Present Illness:    Jamie Pennington is a 62 y.o. female with a past medical history significant for CAD s/p RCA stent 2004, cath in 2016 minimal CAD and patent stent, HTN.  Today she is here for routine follow up of CAD. She was having occ shortness of breath and cough related to her COPD. She is getting over a COPD flare and is much better. She has been treated several times in the last year for COPD flare with oral steroids. No chest pain/pressure/tightness. No orthopnea, PND or edema. She works as a Production designer, theatre/television/film for [employer] and does a lot of walking with no exertional shortness of breath or chest discomfort. She does not do formal exercise. She denies palpitations, lightheadedness, syncope.   She reports that her diet is good. She avoids salt intake. She eats salads about every day. She is concerned about a 20 lb wt gain in the last year, wt has been stable in the last 6 months.   She is a remote smoker, none since 1999.   Past Medical History:  Diagnosis Date  . Coronary artery disease   . Hyperlipidemia   . Hypertension   . Myocardial infarct (HCC)    hx of . s/p stent placement in 2004    Past Surgical History:  Procedure Laterality Date  . ANGIOPLASTY     with stent  placement to the right coronary.  Marland Kitchen BREAST BIOPSY     right  . CARDIAC CATHETERIZATION  2004  . CARDIAC CATHETERIZATION N/A 11/28/2014   Procedure: Left Heart Cath and Coronary Angiography;  Surgeon: Lennette Bihari, MD;  Location: [facility] INVASIVE CV LAB;  Service: Cardiovascular;  Laterality: N/A;  . Cotton Osteotomy with Bone Graft Right 11/24/2012   @ [facility]  . KNEE SURGERY  2010   left  . Plantar Endoscopic Fasciotomy Right  11/24/2012   @ [facility]    Current Medications: Current Meds  Medication Sig  . Albuterol Sulfate (VENTOLIN HFA IN) Inhale 2 puffs into the lungs as needed (Wheezing).  Marland Kitchen amLODipine (NORVASC) 5 MG tablet TAKE 1 TABLET BY MOUTH ONCE DAILY  . aspirin 81 MG tablet Take 81 mg by mouth daily. Reported on 11/27/2015  . atorvastatin (LIPITOR) 40 MG tablet TAKE 1 TABLET BY MOUTH EVERY DAY  . calcium-vitamin D (OSCAL WITH D 500-200) 500-200 MG-UNIT per tablet Take 1 tablet by mouth 2 (two) times daily.    . cetirizine (ZYRTEC) 10 MG tablet Take 10 mg by mouth daily as needed for allergies. Pt states that she rotates between claritin and zyrtec.  . ezetimibe (ZETIA) 10 MG tablet Take 1 tablet (10 mg total) by mouth daily. Please keep upcoming appt in June for future refills. Thank you  . Fluticasone Furoate 100 MCG/ACT AEPB Inhale 1 puff into the lungs daily.  . metoprolol tartrate (LOPRESSOR) 25 MG tablet Take 1 tablet (25 mg total) by mouth 2 (two) times daily.  . Multiple Vitamin (MULTIVITAMIN) tablet Take 1 tablet by mouth daily.    . nitroGLYCERIN (NITROSTAT) 0.4 MG SL tablet Place 0.4 mg under the tongue every 5 (five) minutes as needed for chest pain (MAX 3 TABLETS).  Marland Kitchen  omeprazole (PRILOSEC) 40 MG capsule TAKE 1 CAPSULE BY MOUTH EVERY MORNING FOR REFLUX  . ranitidine (ZANTAC) 300 MG tablet TAKE 1 TABLET BY MOUTH EVERY EVENING AS DIRECTED FOR REFLUX     Allergies:   Patient has no known allergies.   Social History   Socioeconomic History  . Marital status: Legally Separated    Spouse name: Not on file  . Number of children: Not on file  . Years of education: Not on file  . Highest education level: Not on file  Occupational History  . Not on file  Social Needs  . Financial resource strain: Not on file  . Food insecurity:    Worry: Not on file    Inability: Not on file  . Transportation needs:    Medical: Not on file    Non-medical: Not on file  Tobacco Use  . Smoking status: Former  Smoker    Packs/day: 1.00    Years: 20.00    Pack years: 20.00    Last attempt to quit: 01/18/1998    Years since quitting: 19.7  . Smokeless tobacco: Never Used  Substance and Sexual Activity  . Alcohol use: No  . Drug use: No  . Sexual activity: Not on file  Lifestyle  . Physical activity:    Days per week: Not on file    Minutes per session: Not on file  . Stress: Not on file  Relationships  . Social connections:    Talks on phone: Not on file    Gets together: Not on file    Attends religious service: Not on file    Active member of club or organization: Not on file    Attends meetings of clubs or organizations: Not on file    Relationship status: Not on file  Other Topics Concern  . Not on file  Social History Narrative  . Not on file     Family History: The patient's family history includes Alzheimer's disease (age of onset: 3290) in her mother; Heart attack in her sister; Stroke (age of onset: 3260) in her father. ROS:   Please see the history of present illness.     All other systems reviewed and are negative.  EKGs/Labs/Other Studies Reviewed:    The following studies were reviewed today:  Left Heart Cath and Coronary Angiography  11/28/14  Conclusion   Prox RCA to Mid RCA lesion, 20% stenosed. The lesion was previously treated with a stent (unknown type) .  Mid Cx lesion, 30% stenosed.   Preserved LV function with an ejection fraction of 55-60% with a small region of mid inferior hypocontractility.  No significant coronary obstructive disease with normal left main and LAD; 30% smooth narrowing in the circumflex vessel after the OM1 vessel; and widely patent stent in the mid RCA with smooth 20% intimal hyperplasia and evidence for minimal ostial RCA calcification.  RECOMMENDATION:  Medical therapy.      EKG:  EKG is  ordered today.  The ekg ordered today demonstrates Normal sinus rhythm, 61 bpm  Recent Labs: No results found for requested labs  within last 8760 hours.   Recent Lipid Panel    Component Value Date/Time   CHOL 134 12/08/2016 0902   TRIG 108 12/08/2016 0902   HDL 56 12/08/2016 0902   CHOLHDL 2.4 12/08/2016 0902   CHOLHDL 3.6 06/20/2015 0849   VLDL 24 06/20/2015 0849   LDLCALC 56 12/08/2016 0902   LDLDIRECT 99 08/02/2012 0917    Physical Exam:  VS:  BP 112/68   Pulse 61   Ht 5\' 3"  (1.6 m)   Wt 184 lb (83.5 kg)   SpO2 93%   BMI 32.59 kg/m     Wt Readings from Last 3 Encounters:  10/20/17 184 lb (83.5 kg)  06/16/17 183 lb 9.6 oz (83.3 kg)  06/04/17 183 lb 12.8 oz (83.4 kg)     Physical Exam  Constitutional: She is oriented to person, place, and time. She appears well-developed and well-nourished. No distress.  HENT:  Head: Normocephalic and atraumatic.  Neck: Normal range of motion. Neck supple. No JVD present.  Cardiovascular: Normal rate, regular rhythm, normal heart sounds and intact distal pulses. Exam reveals no gallop and no friction rub.  No murmur heard. Pulmonary/Chest: Effort normal and breath sounds normal. No respiratory distress. She has no wheezes. She has no rales.  Abdominal: Soft. Bowel sounds are normal.  Musculoskeletal: Normal range of motion. She exhibits no edema.  Neurological: She is alert and oriented to person, place, and time.  Skin: Skin is warm and dry.  Psychiatric: She has a normal mood and affect. Her behavior is normal. Thought content normal.     ASSESSMENT:    1. Coronary artery disease involving native coronary artery of native heart without angina pectoris   2. HYPERTENSION, BENIGN SYSTEMIC   3. Dyslipidemia, goal LDL below 70    PLAN:    In order of problems listed above:  CAD:  s/p RCA stent 2004, cath in 2016 minimal CAD and patent stent. No anginal symptoms. She has had recent multiple COPD flares, better now on inhalers.  Will update BMET, CBC  Hypertension: On amlodipine 5 mg, metoprolol 25 mg bid. BP well controlled. Continue current  medications.   Dyslipidemia: Zetia was added in 09/2016 with improved lipids, last LDL was 56 on 12/08/2016 which is at goal of less than 70.  Will check lipid panel to ensure still at goal.  Follow-up 1 year    Medication Adjustments/Labs and Tests Ordered: Current medicines are reviewed at length with the patient today.  Concerns regarding medicines are outlined above. Labs and tests ordered and medication changes are outlined in the patient instructions below:  Patient Instructions  Medication Instructions: Your physician recommends that you continue on your current medications as directed. Please refer to the Current Medication list given to you today.   Labwork: TODAY: BMET, CBC & LIPIDS   Procedures/Testing: None   Follow-Up: Your physician wants you to follow-up in: 1 year with Dr.Ross You will receive a reminder letter in the mail two months in advance. If you don't receive a letter, please call our office to schedule the follow-up appointment.   Any Additional Special Instructions Will Be Listed Below (If Applicable).     If you need a refill on your cardiac medications before your next appointment, please call your pharmacy.      Signed, Berton Bon, NP  10/20/2017 11:27 AM    [facility]

## 2017-10-20 NOTE — Patient Instructions (Signed)
Medication Instructions: Your physician recommends that you continue on your current medications as directed. Please refer to the Current Medication list given to you today.   Labwork: TODAY: BMET, CBC & LIPIDS   Procedures/Testing: None   Follow-Up: Your physician wants you to follow-up in: 1 year with Dr.Ross You will receive a reminder letter in the mail two months in advance. If you don't receive a letter, please call our office to schedule the follow-up appointment.   Any Additional Special Instructions Will Be Listed Below (If Applicable).     If you need a refill on your cardiac medications before your next appointment, please call your pharmacy.

## 2017-10-28 ENCOUNTER — Other Ambulatory Visit: Payer: Self-pay | Admitting: Internal Medicine

## 2017-10-28 ENCOUNTER — Other Ambulatory Visit: Payer: Self-pay | Admitting: Cardiology

## 2017-11-02 ENCOUNTER — Other Ambulatory Visit: Payer: Self-pay | Admitting: Cardiology

## 2017-11-29 ENCOUNTER — Other Ambulatory Visit: Payer: Self-pay | Admitting: Family Medicine

## 2017-12-01 ENCOUNTER — Encounter: Payer: Self-pay | Admitting: Family Medicine

## 2017-12-01 ENCOUNTER — Ambulatory Visit (INDEPENDENT_AMBULATORY_CARE_PROVIDER_SITE_OTHER): Payer: 59 | Admitting: Family Medicine

## 2017-12-01 ENCOUNTER — Other Ambulatory Visit: Payer: Self-pay

## 2017-12-01 DIAGNOSIS — R5383 Other fatigue: Secondary | ICD-10-CM

## 2017-12-01 DIAGNOSIS — R0989 Other specified symptoms and signs involving the circulatory and respiratory systems: Secondary | ICD-10-CM

## 2017-12-01 DIAGNOSIS — R413 Other amnesia: Secondary | ICD-10-CM

## 2017-12-01 NOTE — Assessment & Plan Note (Signed)
No evidence of dementia.  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  Monitor

## 2017-12-01 NOTE — Patient Instructions (Addendum)
Good to see you today!  Thanks for coming in.  Phlegm - use nasal saline at least 4 times a day and see if this helps over the next few weeks  Fatigue - check labs - get an echocardiogram - I will contact Dr Tenny Crawoss and get back to you - if you do not hear from us 2 weeks - please call - if the echo is ok would slowly start an exercise program - if the echo is ok will refer your to pulmonology if still having symptoms  Memory - will check lab tests - working on stress would be the best approach

## 2017-12-01 NOTE — Assessment & Plan Note (Signed)
Seems primarily to be dyspnea.   Probably mutlifactorial due to weight gain and stress as well as her COPD and perhaps cardiac condition.  Would check echo first (last done in 2016) and if normal refer to pulmonology for PFTs and treatment for advanced COPD.

## 2017-12-01 NOTE — Progress Notes (Addendum)
Subjective  Jamie SolianGail Pennington is a 62 y.o. female is presenting with the following  PHLEGM Seems to have phlegm in her throat frequently.  Does not cough up sputum or have sore throat or neck lumps  FATIGUE Patient complains of fatigue for the last many days. The Tiredness is described as mostly dyspnea when moves around Feels like doing activities but can't: yes wants to do things Does not feel like doing things: no is stressed and feels like has to accomplish a lot  Feels the fatigue is due to: her breathing   Symptoms Fever: no Sweating at night: no Weight Loss: No has gained about 20 lbs Shortness of Breath: yes with exertion Coughing up Blood: no Muscle Pain or Weakness: no Black or bloody Stools: no Severe Snoring or Daytime Sleepiness: no Feeling Down: sometimes Not enjoying things: very stressed Rash: no Leg or Joint Swelling: mild Chest Pain or irregular heart beat:  no  ROS - Please see HPI Smoking Status Noted   MEMORY Feels her memory is not as good as used to be.  Transposes figures and has to think about things that used to be automatic.  Has not gotten lost or not caught mistakes and others have not seemed to notice.  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  Has Fhx of dementia  Chief Complaint noted Review of Symptoms - see HPI PMH - Smoking status noted.    Objective Vital Signs reviewed BP (!) 174/82   Pulse 71   Temp 97.9 F (36.6 C) (Oral)   Ht 5\' 3"  (1.6 m)   Wt 186 lb (84.4 kg)   SpO2 96%   BMI 32.95 kg/m  NAD Heart - Regular rate and rhythm.  No murmurs, gallops or rubs.    Lungs:  Normal respiratory effort, chest expands symmetrically. Lungs are clear to auscultation, no crackles or wheezes. Extremities:  No cyanosis, trace edema at ankles, or deformity noted with good range of motion of all major joints.   Neck:  No deformities, thyromegaly, masses, or tenderness noted.   Supple with full range of motion without pain. Able to walk  around office and maintain sats > 96% but is mildly winded  Knows her medications and doses  Assessments/Plans  See after visit summary for details of patient instuctions  Fatigue Seems primarily to be dyspnea.   Probably mutlifactorial due to weight gain and stress as well as her COPD and perhaps cardiac condition.  Would check echo first (last done in 2016) and if normal refer to pulmonology for PFTs and treatment for advanced COPD.    Memory difficulties No evidence of dementia.  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  Monitor   Phlegm in throat Possible post nasal drip.  Trial of nasal saline.  Maybe COPD related

## 2017-12-01 NOTE — Assessment & Plan Note (Signed)
Possible post nasal drip.  Trial of nasal saline.  Maybe COPD related

## 2017-12-02 ENCOUNTER — Telehealth: Payer: Self-pay | Admitting: *Deleted

## 2017-12-02 ENCOUNTER — Encounter: Payer: Self-pay | Admitting: Family Medicine

## 2017-12-02 LAB — HEPATIC FUNCTION PANEL
ALBUMIN: 4 g/dL (ref 3.6–4.8)
ALK PHOS: 96 IU/L (ref 39–117)
ALT: 31 IU/L (ref 0–32)
AST: 23 IU/L (ref 0–40)
BILIRUBIN TOTAL: 0.4 mg/dL (ref 0.0–1.2)
Bilirubin, Direct: 0.1 mg/dL (ref 0.00–0.40)
Total Protein: 6.5 g/dL (ref 6.0–8.5)

## 2017-12-02 LAB — FOLATE: Folate: >20 ng/mL (ref >3.0)

## 2017-12-02 LAB — TSH: TSH: 2.1 u[IU]/mL (ref 0.450–4.500)

## 2017-12-02 LAB — HIV ANTIBODY (ROUTINE TESTING W REFLEX): HIV Screen 4th Generation wRfx: NONREACTIVE

## 2017-12-02 LAB — RPR: RPR Ser Ql: NONREACTIVE

## 2017-12-02 LAB — VITAMIN B12: Vitamin B-12: 658 pg/mL (ref 232–1245)

## 2017-12-02 NOTE — Telephone Encounter (Signed)
Pt informed of echo appt. Deseree Bruna PotterBlount, CMA

## 2017-12-28 ENCOUNTER — Other Ambulatory Visit: Payer: Self-pay | Admitting: Family Medicine

## 2017-12-28 ENCOUNTER — Other Ambulatory Visit: Payer: Self-pay | Admitting: Internal Medicine

## 2017-12-28 ENCOUNTER — Telehealth: Payer: Self-pay

## 2017-12-28 NOTE — Telephone Encounter (Signed)
[facility] Om[facility]razole. Clinical information submitted via CoverMyMeds, status pending. Will check status in 24 hours.   Ples SpecterAlisa Brake, RN [facility] RN)

## 2017-12-28 NOTE — Telephone Encounter (Signed)
Approved per CoverMyMeds from 12/28/17 - 12/29/18. [facility] notified. Ples SpecterAlisa Brake, RN [facility])

## 2017-12-31 ENCOUNTER — Ambulatory Visit (HOSPITAL_COMMUNITY)
Admission: RE | Admit: 2017-12-31 | Discharge: 2017-12-31 | Disposition: A | Discharge status: Home / Self Care | Payer: 59 | Source: Ambulatory Visit | Attending: Family Medicine | Admitting: Family Medicine

## 2017-12-31 DIAGNOSIS — I119 Hypertensive heart disease without heart failure: Secondary | ICD-10-CM | POA: Insufficient documentation

## 2017-12-31 DIAGNOSIS — J449 Chronic obstructive pulmonary disease, unspecified: Secondary | ICD-10-CM | POA: Diagnosis not present

## 2017-12-31 DIAGNOSIS — I071 Rheumatic tricuspid insufficiency: Secondary | ICD-10-CM | POA: Diagnosis not present

## 2017-12-31 DIAGNOSIS — I2510 Atherosclerotic heart disease of native coronary artery without angina pectoris: Secondary | ICD-10-CM | POA: Insufficient documentation

## 2017-12-31 DIAGNOSIS — E785 Hyperlipidemia, unspecified: Secondary | ICD-10-CM | POA: Insufficient documentation

## 2017-12-31 DIAGNOSIS — R5383 Other fatigue: Secondary | ICD-10-CM

## 2017-12-31 NOTE — Progress Notes (Signed)
  Echocardiogram 2D Echocardiogram has been performed.  Jamie Pennington L Androw 12/31/2017, 9:51 AM

## 2018-01-03 ENCOUNTER — Encounter: Payer: Self-pay | Admitting: Podiatry

## 2018-01-03 ENCOUNTER — Ambulatory Visit: Payer: 59 | Admitting: Podiatry

## 2018-01-03 DIAGNOSIS — M722 Plantar fascial fibromatosis: Secondary | ICD-10-CM

## 2018-01-03 NOTE — Progress Notes (Signed)
Doing well with orthotics. Not in pain. Doing some exercise but not in regular basis. Reviewed biomechanics of dynamic gait pattern versus static gate. Encouraged to start on regular exercise program to increase lower leg muscle tone and strength. Return as needed.

## 2018-01-03 NOTE — Patient Instructions (Signed)
One month orthotic follow up. Doing well and no pain in left heel. May start working on building lower leg muscle tone and strength to increase dynamic gait pattern. Return as needed.

## 2018-01-12 ENCOUNTER — Encounter: Payer: Self-pay | Admitting: Family Medicine

## 2018-02-16 ENCOUNTER — Other Ambulatory Visit: Payer: Self-pay

## 2018-02-16 ENCOUNTER — Ambulatory Visit: Payer: 59 | Admitting: Family Medicine

## 2018-02-16 VITALS — BP 138/72 | HR 70 | Temp 98.1°F | Ht 63.0 in | Wt 188.4 lb

## 2018-02-16 DIAGNOSIS — J441 Chronic obstructive pulmonary disease with (acute) exacerbation: Secondary | ICD-10-CM | POA: Diagnosis not present

## 2018-02-16 DIAGNOSIS — M25521 Pain in right elbow: Secondary | ICD-10-CM

## 2018-02-16 MED ORDER — DOXYCYCLINE HYCLATE 100 MG PO TABS: 100.0000 mg | ORAL_TABLET | Freq: Two times a day (BID) | ORAL | 0 refills | Status: DC

## 2018-02-16 MED ORDER — BENZONATATE 100 MG PO CAPS: 100.0000 mg | ORAL_CAPSULE | Freq: Three times a day (TID) | ORAL | 0 refills | Status: DC | PRN

## 2018-02-16 MED ORDER — PREDNISONE 50 MG PO TABS: ORAL_TABLET | ORAL | 0 refills | Status: DC

## 2018-02-16 NOTE — Assessment & Plan Note (Signed)
  Unclear cause of pain, no trauma, no repetitive movement to give lateral epicondylitis. Doubt arthritis in elbow joint as first affected joint. Would normally try course of antiinflammatories and I have prescribed steroids for COPD exacerbation so hopefully this will relieve her pain. Asked her to follow up if pain persists, would consider either x-ray or referral to sports med in the future.

## 2018-02-16 NOTE — Patient Instructions (Addendum)
   It was great seeing you today! Sorry you're not feeling well. Please start taking antibiotics and steroids tonight Please use albuterol 2 puffs every 4 hours.  Please return to be seen if your symptoms worsen or if you develop high fevers or shortness of breath.  If you have questions or concerns please do not hesitate to call at [phone].  Dolores Patty, DO PGY-3, [facility] 02/16/2018 4:01 PM   Chronic Obstructive Pulmonary Disease Exacerbation Chronic obstructive pulmonary disease (COPD) is a common lung problem. In COPD, the flow of air from the lungs is limited. COPD exacerbations are times that breathing gets worse and you need extra treatment. Without treatment they can be life threatening. If they happen often, your lungs can become more damaged. If your COPD gets worse, your doctor may treat you with:  Medicines.  Oxygen.  Different ways to clear your airway, such as using a mask.  Follow these instructions at home:  Do not smoke.  Avoid tobacco smoke and other things that bother your lungs.  If given, take your antibiotic medicine as told. Finish the medicine even if you start to feel better.  Only take medicines as told by your doctor.  Drink enough fluids to keep your pee (urine) clear or pale yellow (unless your doctor has told you not to).  Use a cool mist machine (vaporizer).  If you use oxygen or a machine that turns liquid medicine into a mist (nebulizer), continue to use them as told.  Keep up with shots (vaccinations) as told by your doctor.  Exercise regularly.  Eat healthy foods.  Keep all doctor visits as told. Get help right away if:  You are very short of breath and it gets worse.  You have trouble talking.  You have bad chest pain.  You have blood in your spit (sputum).  You have a fever.  You keep throwing up (vomiting).  You feel weak, or you pass out (faint).  You feel confused.  You keep getting  worse. This information is not intended to replace advice given to you by your health care provider. Make sure you discuss any questions you have with your health care provider. Document Released: 04/16/2011 Document Revised: 10/03/2015 Document Reviewed: 12/30/2012 Elsevier Interactive Patient Education  2017 [employer].

## 2018-02-16 NOTE — Progress Notes (Signed)
    Subjective:    Patient ID: Jamie Pennington, female    DOB: 1955/09/26, 62 y.o.   MRN: [id number]   CC: cough  HPI: patient reports feeling feverish and having chills at home along with cough since Sunday. She denies congestion or runny nose. She is using albuterol more than often, about twice a day, she reports increased wheeze. She denies sputum production. She has not formally taken temperature. She denies sick contacts. Reports she gets sick about twice a year with something similar and require steroids and antibiotics to get rid of it. She reports this has "turned to pneumonia" twice in the past.    She also wants to talk about her right elbow pain. She localizes this pain to the lateral elbow. She denies injury or trauma to area. She denies repetitive motion. She reports it just started hurting one day and now she is feeling pain move down into her forearm. She has not tried any medication for this. She is worried it is arthritis.   Smoking status reviewed- former smoker quit 1999  Review of Systems- no chest pain or shortness of breath   Objective:  BP 138/72   Pulse 70   Temp 98.1 F (36.7 C) (Oral)   Ht 5\' 3"  (1.6 m)   Wt 188 lb 6.4 oz (85.5 kg)   SpO2 94%   BMI 33.37 kg/m  Vitals and nursing note reviewed  General: well nourished, in no acute distress HEENT: normocephalic, moist mucous membranes, good dentition without erythema or discharge noted in posterior oropharynx Cardiac: RRR, clear S1 and S2, no murmurs, rubs, or gallops Respiratory: no increased work of breathing but diffuse expiratory wheezing in all lung fields Extremities: no edema or cyanosis. Tender to palpation of lateral left elbow, no joint swelling or effusion appreciated, normal and full ROM of left elbow  Neuro: alert and oriented, no focal deficits  Assessment & Plan:    COPD exacerbation (HCC)  Patient with cough and diffuse wheezing. Instructed scheduled use of albuterol inhaler 2 puffs q4  hours. Will treat for COPD exacerbation with doxycycline x 10 days, course of steroids, and tessalon perles. Strict return precautions given.   Right elbow pain  Unclear cause of pain, no trauma, no repetitive movement to give lateral epicondylitis. Doubt arthritis in elbow joint as first affected joint. Would normally try course of antiinflammatories and I have prescribed steroids for COPD exacerbation so hopefully this will relieve her pain. Asked her to follow up if pain persists, would consider either x-ray or referral to sports med in the future.     Return if symptoms worsen or fail to improve.   Dolores Patty, DO Family Medicine Resident PGY-3

## 2018-02-16 NOTE — Assessment & Plan Note (Signed)
  Patient with cough and diffuse wheezing. Instructed scheduled use of albuterol inhaler 2 puffs q4 hours. Will treat for COPD exacerbation with doxycycline x 10 days, course of steroids, and tessalon perles. Strict return precautions given.

## 2018-02-24 ENCOUNTER — Other Ambulatory Visit: Payer: Self-pay

## 2018-02-24 ENCOUNTER — Inpatient Hospital Stay (HOSPITAL_COMMUNITY)
Admission: AD | Admit: 2018-02-24 | Discharge: 2018-02-26 | DRG: 192 | Disposition: A | Discharge status: Home / Self Care | Payer: 59 | Source: Ambulatory Visit | Attending: Family Medicine | Admitting: Family Medicine

## 2018-02-24 ENCOUNTER — Ambulatory Visit: Payer: 59 | Admitting: Family Medicine

## 2018-02-24 ENCOUNTER — Encounter (HOSPITAL_COMMUNITY): Payer: Self-pay | Admitting: General Practice

## 2018-02-24 VITALS — BP 144/80 | HR 71 | Temp 97.8°F | Ht 63.0 in | Wt 185.3 lb

## 2018-02-24 DIAGNOSIS — Z79899 Other long term (current) drug therapy: Secondary | ICD-10-CM | POA: Diagnosis not present

## 2018-02-24 DIAGNOSIS — I10 Essential (primary) hypertension: Secondary | ICD-10-CM | POA: Diagnosis present

## 2018-02-24 DIAGNOSIS — R062 Wheezing: Secondary | ICD-10-CM | POA: Diagnosis not present

## 2018-02-24 DIAGNOSIS — Z8249 Family history of ischemic heart disease and other diseases of the circulatory system: Secondary | ICD-10-CM | POA: Diagnosis not present

## 2018-02-24 DIAGNOSIS — J441 Chronic obstructive pulmonary disease with (acute) exacerbation: Secondary | ICD-10-CM

## 2018-02-24 DIAGNOSIS — I2510 Atherosclerotic heart disease of native coronary artery without angina pectoris: Secondary | ICD-10-CM

## 2018-02-24 DIAGNOSIS — E785 Hyperlipidemia, unspecified: Secondary | ICD-10-CM | POA: Diagnosis present

## 2018-02-24 DIAGNOSIS — Z7952 Long term (current) use of systemic steroids: Secondary | ICD-10-CM

## 2018-02-24 DIAGNOSIS — Z823 Family history of stroke: Secondary | ICD-10-CM

## 2018-02-24 DIAGNOSIS — J449 Chronic obstructive pulmonary disease, unspecified: Secondary | ICD-10-CM | POA: Diagnosis present

## 2018-02-24 DIAGNOSIS — Z7902 Long term (current) use of antithrombotics/antiplatelets: Secondary | ICD-10-CM

## 2018-02-24 DIAGNOSIS — Z7982 Long term (current) use of aspirin: Secondary | ICD-10-CM | POA: Diagnosis not present

## 2018-02-24 DIAGNOSIS — K219 Gastro-esophageal reflux disease without esophagitis: Secondary | ICD-10-CM

## 2018-02-24 DIAGNOSIS — F329 Major depressive disorder, single episode, unspecified: Secondary | ICD-10-CM | POA: Diagnosis present

## 2018-02-24 DIAGNOSIS — I252 Old myocardial infarction: Secondary | ICD-10-CM

## 2018-02-24 DIAGNOSIS — Z820 Family history of epilepsy and other diseases of the nervous system: Secondary | ICD-10-CM

## 2018-02-24 DIAGNOSIS — J438 Other emphysema: Secondary | ICD-10-CM

## 2018-02-24 DIAGNOSIS — G43909 Migraine, unspecified, not intractable, without status migrainosus: Secondary | ICD-10-CM | POA: Diagnosis present

## 2018-02-24 DIAGNOSIS — G44209 Tension-type headache, unspecified, not intractable: Secondary | ICD-10-CM | POA: Diagnosis not present

## 2018-02-24 HISTORY — DX: Pneumonia, unspecified organism: J18.9

## 2018-02-24 HISTORY — DX: Inflammatory liver disease, unspecified: K75.9

## 2018-02-24 HISTORY — DX: Headache, unspecified: R51.9

## 2018-02-24 HISTORY — DX: Headache: R51

## 2018-02-24 HISTORY — DX: Gastro-esophageal reflux disease without esophagitis: K21.9

## 2018-02-24 HISTORY — DX: Chronic obstructive pulmonary disease, unspecified: J44.9

## 2018-02-24 LAB — CBC WITH DIFFERENTIAL/PLATELET
Abs Immature Granulocytes: 0.04 10*3/uL (ref 0.00–0.07)
BASOS PCT: 0 %
Basophils Absolute: 0 10*3/uL (ref 0.0–0.1)
EOS ABS: 0.1 10*3/uL (ref 0.0–0.5)
EOS PCT: 1 %
HEMATOCRIT: 45.7 % (ref 36.0–46.0)
HEMOGLOBIN: 14 g/dL (ref 12.0–15.0)
IMMATURE GRANULOCYTES: 1 %
LYMPHS ABS: 3.1 10*3/uL (ref 0.7–4.0)
Lymphocytes Relative: 36 %
MCH: 27 pg (ref 26.0–34.0)
MCHC: 30.6 g/dL (ref 30.0–36.0)
MCV: 88.2 fL (ref 80.0–100.0)
MONO ABS: 0.7 10*3/uL (ref 0.1–1.0)
MONOS PCT: 8 %
Neutro Abs: 4.5 10*3/uL (ref 1.7–7.7)
Neutrophils Relative %: 54 %
Platelets: 332 10*3/uL (ref 150–400)
RBC: 5.18 MIL/uL — ABNORMAL HIGH (ref 3.87–5.11)
RDW: 13.9 % (ref 11.5–15.5)
WBC: 8.5 10*3/uL (ref 4.0–10.5)
nRBC: 0 % (ref 0.0–0.2)

## 2018-02-24 LAB — COMPREHENSIVE METABOLIC PANEL
ALK PHOS: 66 U/L (ref 38–126)
ALT: 25 U/L (ref 0–44)
AST: 28 U/L (ref 15–41)
Albumin: 3.4 g/dL — ABNORMAL LOW (ref 3.5–5.0)
Anion gap: 11 (ref 5–15)
BILIRUBIN TOTAL: 1 mg/dL (ref 0.3–1.2)
BUN: 18 mg/dL (ref 8–23)
CALCIUM: 9.4 mg/dL (ref 8.9–10.3)
CO2: 25 mmol/L (ref 22–32)
CREATININE: 0.63 mg/dL (ref 0.44–1.00)
Chloride: 104 mmol/L (ref 98–111)
GFR calc Af Amer: >60 mL/min (ref >60)
GFR calc non Af Amer: >60 mL/min (ref >60)
GLUCOSE: 98 mg/dL (ref 70–99)
Potassium: 3.8 mmol/L (ref 3.5–5.1)
SODIUM: 140 mmol/L (ref 135–145)
TOTAL PROTEIN: 6.5 g/dL (ref 6.5–8.1)

## 2018-02-24 MED ORDER — METHYLPREDNISOLONE SODIUM SUCC 125 MG IJ SOLR: 125.0000 mg | Freq: Once | INTRAMUSCULAR | Status: AC

## 2018-02-24 MED ORDER — ALBUTEROL SULFATE (2.5 MG/3ML) 0.083% IN NEBU
2.5000 mg | INHALATION_SOLUTION | Freq: Once | RESPIRATORY_TRACT | Status: AC
  Administered 2018-02-24: 2.5 mg via RESPIRATORY_TRACT

## 2018-02-24 MED ORDER — BUDESONIDE 0.5 MG/2ML IN SUSP: 0.5000 mg | Freq: Two times a day (BID) | RESPIRATORY_TRACT | Status: DC

## 2018-02-24 MED ORDER — IPRATROPIUM BROMIDE 0.02 % IN SOLN
0.5000 mg | Freq: Once | RESPIRATORY_TRACT | Status: AC
  Administered 2018-02-24: 0.5 mg via RESPIRATORY_TRACT

## 2018-02-24 MED ORDER — METHYLPREDNISOLONE SODIUM SUCC 125 MG IJ SOLR
125.0000 mg | Freq: Four times a day (QID) | INTRAMUSCULAR | Status: DC
  Administered 2018-02-24: 125 mg via INTRAVENOUS
  Filled 2018-02-24: qty 2

## 2018-02-24 MED ORDER — ACETAMINOPHEN 325 MG PO TABS
650.0000 mg | ORAL_TABLET | Freq: Four times a day (QID) | ORAL | Status: DC | PRN
  Administered 2018-02-24: 650 mg via ORAL
  Filled 2018-02-24: qty 2

## 2018-02-24 MED ORDER — METOPROLOL TARTRATE 25 MG PO TABS
25.0000 mg | ORAL_TABLET | Freq: Two times a day (BID) | ORAL | Status: DC
  Administered 2018-02-24 – 2018-02-26 (×4): 25 mg via ORAL
  Filled 2018-02-24 (×4): qty 1

## 2018-02-24 MED ORDER — AMLODIPINE BESYLATE 5 MG PO TABS
5.0000 mg | ORAL_TABLET | Freq: Every day | ORAL | Status: DC
  Administered 2018-02-25 – 2018-02-26 (×2): 5 mg via ORAL
  Filled 2018-02-24 (×2): qty 1

## 2018-02-24 MED ORDER — PAROXETINE HCL 20 MG PO TABS
40.0000 mg | ORAL_TABLET | Freq: Every day | ORAL | Status: DC
  Administered 2018-02-25 – 2018-02-26 (×2): 40 mg via ORAL
  Filled 2018-02-24 (×2): qty 2

## 2018-02-24 MED ORDER — ENOXAPARIN SODIUM 40 MG/0.4ML ~~LOC~~ SOLN
40.0000 mg | SUBCUTANEOUS | Status: DC
  Administered 2018-02-25 – 2018-02-26 (×2): 40 mg via SUBCUTANEOUS
  Filled 2018-02-24 (×2): qty 0.4

## 2018-02-24 MED ORDER — LORATADINE 10 MG PO TABS
10.0000 mg | ORAL_TABLET | Freq: Every day | ORAL | Status: DC
  Administered 2018-02-25 – 2018-02-26 (×2): 10 mg via ORAL
  Filled 2018-02-24 (×2): qty 1

## 2018-02-24 MED ORDER — LEVOFLOXACIN 750 MG PO TABS
750.0000 mg | ORAL_TABLET | Freq: Every day | ORAL | Status: DC
  Administered 2018-02-24 – 2018-02-25 (×2): 750 mg via ORAL
  Filled 2018-02-24 (×2): qty 1

## 2018-02-24 MED ORDER — PANTOPRAZOLE SODIUM 40 MG PO TBEC
40.0000 mg | DELAYED_RELEASE_TABLET | Freq: Every day | ORAL | Status: DC
  Administered 2018-02-25 – 2018-02-26 (×2): 40 mg via ORAL
  Filled 2018-02-24 (×2): qty 1

## 2018-02-24 MED ORDER — IPRATROPIUM-ALBUTEROL 0.5-2.5 (3) MG/3ML IN SOLN
3.0000 mL | Freq: Four times a day (QID) | RESPIRATORY_TRACT | Status: DC | PRN
  Administered 2018-02-25: 3 mL via RESPIRATORY_TRACT
  Filled 2018-02-24: qty 3

## 2018-02-24 MED ORDER — NITROGLYCERIN 0.4 MG SL SUBL: 0.4000 mg | SUBLINGUAL_TABLET | SUBLINGUAL | Status: DC | PRN

## 2018-02-24 MED ORDER — ATORVASTATIN CALCIUM 40 MG PO TABS
40.0000 mg | ORAL_TABLET | Freq: Every day | ORAL | Status: DC
  Administered 2018-02-25 – 2018-02-26 (×2): 40 mg via ORAL
  Filled 2018-02-24 (×2): qty 1

## 2018-02-24 MED ORDER — PREDNISONE 20 MG PO TABS: 40.0000 mg | ORAL_TABLET | Freq: Every day | ORAL | Status: DC

## 2018-02-24 MED ORDER — EZETIMIBE 10 MG PO TABS
10.0000 mg | ORAL_TABLET | Freq: Every day | ORAL | Status: DC
  Administered 2018-02-25 – 2018-02-26 (×2): 10 mg via ORAL
  Filled 2018-02-24 (×2): qty 1

## 2018-02-24 MED ORDER — LOSARTAN POTASSIUM 50 MG PO TABS
50.0000 mg | ORAL_TABLET | Freq: Every day | ORAL | Status: DC
  Administered 2018-02-25 – 2018-02-26 (×2): 50 mg via ORAL
  Filled 2018-02-24 (×2): qty 1

## 2018-02-24 MED ORDER — ASPIRIN EC 81 MG PO TBEC
81.0000 mg | DELAYED_RELEASE_TABLET | Freq: Every day | ORAL | Status: DC
  Administered 2018-02-25 – 2018-02-26 (×2): 81 mg via ORAL
  Filled 2018-02-24 (×2): qty 1

## 2018-02-24 MED ORDER — MOMETASONE FURO-FORMOTEROL FUM 200-5 MCG/ACT IN AERO
1.0000 | INHALATION_SPRAY | Freq: Two times a day (BID) | RESPIRATORY_TRACT | Status: DC
  Administered 2018-02-24 – 2018-02-26 (×4): 1 via RESPIRATORY_TRACT
  Filled 2018-02-24: qty 8.8

## 2018-02-24 NOTE — Progress Notes (Signed)
  Subjective:    Patient ID: Jamie Pennington, female    DOB: [birth year], 62 y.o.   MRN: [id number]   CC: COPD exacerbation  HPI: COPD Exacerbation: Patient reports that she is still having a bad productive cough with congestion and wheezing.  Patient seen in clinic on 10/09 and given 5 days of 50 mg prednisone as well as 10 days of doxycycline for COPD exacerbation.  Patient reports that she has taken all of her prednisone but has a few days of doxycycline left.  Patient reports that she has been requiring her albuterol 4-5 times a day.  Reports that she is very short of breath and having to work very hard to breathe. ROS: Denies fevers   Smoking status reviewed  ROS: 10 point ROS is otherwise negative, except as mentioned in HPI  Patient Active Problem List   Diagnosis Date Noted  . Right elbow pain 02/16/2018  . Dyslipidemia, goal LDL below 70 10/20/2017  . COPD exacerbation (HCC) 05/21/2016  . Depression 11/27/2015  . Abnormal nuclear stress test   . Coronary artery disease involving native coronary artery of native heart without angina pectoris   . Fatigue 10/31/2014  . COPD (chronic obstructive pulmonary disease) (HCC) 10/31/2014  . Plantar fasciitis of right foot 11/23/2012  . Metatarsal deformity 11/23/2012  . Carpal tunnel syndrome 08/02/2012  . Dyslipidemia 10/20/2010  . Memory difficulties 02/21/2007  . HYPERTENSION, BENIGN SYSTEMIC 07/08/2006  . coronary artery disease; status post IMI and RCA stenting 2004 07/08/2006     Objective:  BP (!) 144/80   Pulse 71   Temp 97.8 F (36.6 C) (Oral)   Ht 5\' 3"  (1.6 m)   Wt 185 lb 4.8 oz (84.1 kg)   SpO2 96%   BMI 32.82 kg/m  Vitals and nursing note reviewed  General: NAD, pleasant Cardiac: RRR, normal heart sounds, no murmurs Respiratory: Wheezing throughout bilaterally, increased respiratory rate and work of breathing, able to say 4-5 words  Extremities: no edema or cyanosis. WWP. Skin: warm and dry, no rashes  noted Neuro: alert and oriented, no focal deficits Psych: normal affect  Assessment & Plan:    COPD exacerbation (HCC) Patient with COPD exacerbation.  Patient has completed 8 days of doxycycline and a 5-day burst of prednisone.  Discussed with preceptor and will admit patient to hospital given no resolution of symptoms and patient using albuterol 4-5 times per day.  Patient given treatment of albuterol and ipratropium x2 in office with minimal improvement.  Will admit patient to hospital for COPD exacerbation.  Patient will need CXR, EKG, IV steroids, antibiotics and regular albuterol and Atrovent treatments per normal COPD exacerbation admission.   [place] , DO Family Medicine Resident PGY-2

## 2018-02-24 NOTE — Progress Notes (Signed)
  Subjective:    Patient ID: Jamie Pennington, female    DOB: 1955/12/17, 62 y.o.   MRN: [id number]   CC:  HPI:   Smoking status reviewed  ROS: 10 point ROS is otherwise negative, except as mentioned in HPI  Patient Active Problem List   Diagnosis Date Noted  . Right elbow pain 02/16/2018  . Dyslipidemia, goal LDL below 70 10/20/2017  . COPD exacerbation (HCC) 05/21/2016  . Depression 11/27/2015  . Abnormal nuclear stress test   . Coronary artery disease involving native coronary artery of native heart without angina pectoris   . Fatigue 10/31/2014  . COPD (chronic obstructive pulmonary disease) (HCC) 10/31/2014  . Plantar fasciitis of right foot 11/23/2012  . Metatarsal deformity 11/23/2012  . Carpal tunnel syndrome 08/02/2012  . Dyslipidemia 10/20/2010  . Memory difficulties 02/21/2007  . HYPERTENSION, BENIGN SYSTEMIC 07/08/2006  . coronary artery disease; status post IMI and RCA stenting 2004 07/08/2006     Objective:  BP (!) 144/80   Pulse 71   Temp 97.8 F (36.6 C) (Oral)   Ht 5\' 3"  (1.6 m)   Wt 185 lb 4.8 oz (84.1 kg)   SpO2 96%   BMI 32.82 kg/m  Vitals and nursing note reviewed  General: NAD, pleasant Cardiac: RRR, normal heart sounds, no murmurs Respiratory: CTAB, normal effort Abdomen: soft, nontender, nondistended Extremities: no edema or cyanosis. WWP. Skin: warm and dry, no rashes noted Neuro: alert and oriented, no focal deficits Psych: normal affect  Assessment & Plan:    No problem-specific Assessment & Plan notes found for this encounter.    [place] Antwuan Eckley, DO Family Medicine Resident PGY-2

## 2018-02-24 NOTE — Assessment & Plan Note (Addendum)
Patient with COPD exacerbation.  Patient has completed 8 days of doxycycline and a 5-day burst of prednisone.  Discussed with preceptor and will admit patient to hospital given no resolution of symptoms and patient using albuterol 4-5 times per day.  Patient given treatment of albuterol and ipratropium x2 in office with minimal improvement.  Will admit patient to hospital for COPD exacerbation.  Patient will need CXR, EKG, IV steroids, antibiotics and regular albuterol and Atrovent treatments per normal COPD exacerbation admission.

## 2018-02-24 NOTE — H&P (Signed)
Family Medicine Teaching [facility] Admission History and Physical Service Pager: [phone]  Patient name: Jamie Pennington Medical record number: [id number] Date of birth: 1955-06-20 Age: 62 y.o. Gender: female  Primary Care Provider: Carney Living, MD Consultants: none Code Status: full  Chief Complaint: copd exacerbation  Assessment and Plan: Jamie Pennington is a 62 y.o. female presenting as a direct admit for a COPD exacerbation.  Failed outpatient oral prednisone and doxycycline.  COPD exacerbation Status post 5-day course of oral prednisone and doxycycline.  Minimal improvement with medications, and no improvement with 2 breathing treatments in clinic.  Patient with no O2 requirement.  Her symptoms almost certainly due to COPD exacerbation.  Other items on differential are pulmonary embolus, pneumonia, this viral upper respiratory infection, seasonal allergies.  Will score 0 making PE very unlikely.  The bacterial pneumonia is a possibility.  Getting CBC and two-view chest x-ray to further evaluate.  Symptoms could be consistent with a viral URI, with a postinfectious cough.  Given her diffuse wheezing this possibility is less likely.  Patient could also be having an allergy exacerbation severity of symptoms do not fit this. Given outpatient failure will give IV steroids and broad spectrum abx. 3rd exacerbation this year, FEV` at 66%, and lower symtom burden so GOLD C. Will start LABA/ICS  - admit to inpatient family medicine, Inpatient status, med-surg, dr. Manson Passey - vital signs per floor routine - saline lock IV - solumedrol 125mg   x1 - likely prednisone 40mg  pending respiratory status - levaquin 750mg  daily - duonebs q 6 hours prn - dulera 200/5 mcg - cbc and cmp - 2 view chest xray - claritin 10mg  daily  Coronary artery disease  Last cath 11/2014 showing EF 55-60% and 30% stenosis in circumflex and patent stent in mid RCA with only 20% stenosis. On atorvastatin, aspirin  81mg . Takes losartan, metoprolol and has nitrostat prn for chest pain. Well controlled on current regimen. EKG reviewed and was NSR. - discontinue telemetry - continue home losartan, metroprolol, nitrostat - continue atorvastatin, aspirin  Hypertension Takes losartan and metoprolol as above. 167/74 on admission. Likely 2/2 coughing, and pleuritic chest pain. Could consider adding on amlodipine if pressures become more difficult to control.  Hyperlipidemia Cholesterol 153, HDL 43, LDL 83 from 10/2017. On home atorvastatin 40mg  daily, zetia 10mg . - continue zetia and atorvastatin   Depression Takes paxil 40mg  daily. Could consider switching to SSRI with more tolerable side effect profile as outpatient  GERD Takes both pantoprazole and prilosec as outpatient. Unclear if patient needs both. Will hold prilosec, could resume if needed.  PMH is significant for COPD, carpal tunnel syndrome, coronary artery disease, depression, hyperlipidemia, plantar fasciitis, right elbow pain, metatarsal deformity.  FEN/GI: Heart healthy, carb modified Prophylaxis: Lovenox  Disposition: Likely home pending clinical course  History of Present Illness:  Jamie Pennington is a 62 y.o. female presenting as a direct admit from clinic for a COPD exacerbation.  She is noted to have diffuse wheezing and a nonproductive cough with congestion.  She received 2 treatments in clinic which did not improve her symptoms.  Incision was made to direct admit her.  Patient has recently been having audible wheezing, and increased fatigue.  Of note patient was seen on 02/16/2018 for similar symptoms.  She was started on prednisone 50 mg tablet daily, and doxycycline 100 mg twice daily.  She completed the course of these medications and her symptoms did not resolve which prompted her visit to clinic.  States her symptoms originally  started about 2 days prior to this clinic presentation.  She says that she usually has the symptoms during the  fall due to the weather change, and she has had 2 nephews with upper respiratory infections that she is been in close contact with.  She is not been having fevers or chills.  No nausea vomiting diarrhea.  No urinary retention.  She has had some mild congestion in her sinuses.  She is a former smoker but has not smoked in 20 years.  She has been taking her fluticasone as prescribed.  Review Of Systems: Per HPI with the following additions:   Patient Active Problem List   Diagnosis Date Noted  . Right elbow pain 02/16/2018  . Dyslipidemia, goal LDL below 70 10/20/2017  . COPD exacerbation (HCC) 05/21/2016  . Depression 11/27/2015  . Abnormal nuclear stress test   . Coronary artery disease involving native coronary artery of native heart without angina pectoris   . Fatigue 10/31/2014  . COPD (chronic obstructive pulmonary disease) (HCC) 10/31/2014  . Plantar fasciitis of right foot 11/23/2012  . Metatarsal deformity 11/23/2012  . Carpal tunnel syndrome 08/02/2012  . Dyslipidemia 10/20/2010  . Memory difficulties 02/21/2007  . HYPERTENSION, BENIGN SYSTEMIC 07/08/2006  . coronary artery disease; status post IMI and RCA stenting 2004 07/08/2006    Past Medical History: Past Medical History:  Diagnosis Date  . Coronary artery disease   . Hyperlipidemia   . Hypertension   . Myocardial infarct (HCC)    hx of . s/p stent placement in 2004    Past Surgical History: Past Surgical History:  Procedure Laterality Date  . ANGIOPLASTY     with stent  placement to the right coronary.  Marland Kitchen BREAST BIOPSY     right  . CARDIAC CATHETERIZATION  2004  . CARDIAC CATHETERIZATION N/A 11/28/2014   Procedure: Left Heart Cath and Coronary Angiography;  Surgeon: Lennette Bihari, MD;  Location: [facility] INVASIVE CV LAB;  Service: Cardiovascular;  Laterality: N/A;  . Cotton Osteotomy with Bone Graft Right 11/24/2012   @ [facility]  . KNEE SURGERY  2010   left  . Plantar Endoscopic Fasciotomy Right 11/24/2012   @ [facility]     Social History: Social History   Tobacco Use  . Smoking status: Former Smoker    Packs/day: 1.00    Years: 20.00    Pack years: 20.00    Last attempt to quit: 01/18/1998    Years since quitting: 20.1  . Smokeless tobacco: Never Used  Substance Use Topics  . Alcohol use: No  . Drug use: No   Additional social history:  Please also refer to relevant sections of EMR.  Family History: Family History  Problem Relation Age of Onset  . Stroke Father 42       died  . Alzheimer's disease Mother 57       died  . Heart attack Sister        and bypass surgery    Allergies and Medications: No Known Allergies No current facility-administered medications on file prior to encounter.    Current Outpatient Medications on File Prior to Encounter  Medication Sig Dispense Refill  . Albuterol Sulfate (VENTOLIN HFA IN) Inhale 2 puffs into the lungs as needed (Wheezing).    Marland Kitchen amLODipine (NORVASC) 5 MG tablet TAKE 1 TABLET BY MOUTH ONCE DAILY 90 tablet 3  . aspirin 81 MG tablet Take 81 mg by mouth daily. Reported on 11/27/2015    . atorvastatin (LIPITOR) 40 MG  tablet TAKE 1 TABLET BY MOUTH EVERY DAY 90 tablet 3  . benzonatate (TESSALON) 100 MG capsule Take 1 capsule (100 mg total) by mouth 3 (three) times daily as needed for cough. 30 capsule 0  . calcium-vitamin D (OSCAL WITH D 500-200) 500-200 MG-UNIT per tablet Take 1 tablet by mouth 2 (two) times daily.      . cetirizine (ZYRTEC) 10 MG tablet Take 10 mg by mouth daily as needed for allergies. Pt states that she rotates between claritin and zyrtec.    Marland Kitchen doxycycline (VIBRA-TABS) 100 MG tablet Take 1 tablet (100 mg total) by mouth 2 (two) times daily. 20 tablet 0  . ezetimibe (ZETIA) 10 MG tablet TAKE 1 TABLET BY MOUTH DAILY 90 tablet 3  . Fluticasone Furoate 100 MCG/ACT AEPB Inhale 1 puff into the lungs daily.    Marland Kitchen losartan (COZAAR) 50 MG tablet TAKE 1 TABLET BY MOUTH EVERY DAY 90 tablet 2  . metoprolol tartrate (LOPRESSOR) 25 MG tablet  TAKE 1 TABLET BY MOUTH TWICE DAILY 180 tablet 3  . Multiple Vitamin (MULTIVITAMIN) tablet Take 1 tablet by mouth daily.      . nitroGLYCERIN (NITROSTAT) 0.4 MG SL tablet Place 0.4 mg under the tongue every 5 (five) minutes as needed for chest pain (MAX 3 TABLETS).    Marland Kitchen omeprazole (PRILOSEC) 40 MG capsule TAKE 1 CAPSULE BY MOUTH EVERY MORNING FOR REFLUX 90 capsule 3  . PARoxetine (PAXIL) 40 MG tablet TAKE 1 TABLET BY MOUTH ONCE DAILY 30 tablet 2  . predniSONE (DELTASONE) 50 MG tablet Take 1 tab by mouth daily with breakfast 5 tablet 0  . ranitidine (ZANTAC) 300 MG tablet TAKE 1 TABLET BY MOUTH EVERY EVENING AS DIRECTED FOR REFLUX (Patient not taking: Reported on 12/01/2017) 90 tablet 1    Objective: BP (!) 167/74 (BP Location: Left Arm)   Pulse 67   SpO2 96%  Exam: General: Middle-aged, Caucasian female in no acute distress.  Resting comfortably in chair Eyes: EOMI, PERRLA. ENTM: Tongue midline, no external trauma Neck: Symmetric, no range of motion deficit Cardiovascular: Regular rate and rhythm, no m/r/g.  Palpable peripheral pulses. Respiratory: Diffuse wheezing in all lung bases.  No respiratory distress, no accessory muscle use. Gastrointestinal: Soft, nondistended, nontender MSK: 5 out of strength bilateral upper extremity, bilateral lower extremity Derm: Warm, dry Neuro: CN II through XII intact, no focal neurologic deficits Psych: Appropriate  Labs and Imaging: CBC BMET  No results for input(s): WBC, HGB, HCT, PLT in the last 168 hours. No results for input(s): NA, K, CL, CO2, BUN, CREATININE, GLUCOSE, CALCIUM in the last 168 hours.    Myrene Buddy, MD 02/24/2018, 5:59 PM PGY-2, [facility] FPTS Intern pager: [phone], text pages welcome

## 2018-02-25 ENCOUNTER — Inpatient Hospital Stay (HOSPITAL_COMMUNITY): Payer: 59

## 2018-02-25 DIAGNOSIS — G44209 Tension-type headache, unspecified, not intractable: Secondary | ICD-10-CM

## 2018-02-25 LAB — HIV ANTIBODY (ROUTINE TESTING W REFLEX): HIV Screen 4th Generation wRfx: NONREACTIVE

## 2018-02-25 MED ORDER — METHYLPREDNISOLONE SODIUM SUCC 125 MG IJ SOLR
125.0000 mg | Freq: Two times a day (BID) | INTRAMUSCULAR | Status: DC
  Administered 2018-02-25: 125 mg via INTRAVENOUS
  Filled 2018-02-25: qty 2

## 2018-02-25 MED ORDER — IPRATROPIUM-ALBUTEROL 0.5-2.5 (3) MG/3ML IN SOLN
3.0000 mL | Freq: Four times a day (QID) | RESPIRATORY_TRACT | Status: DC
  Administered 2018-02-25 – 2018-02-26 (×4): 3 mL via RESPIRATORY_TRACT
  Filled 2018-02-25 (×4): qty 3

## 2018-02-25 MED ORDER — METHYLPREDNISOLONE SODIUM SUCC 125 MG IJ SOLR
125.0000 mg | Freq: Every day | INTRAMUSCULAR | Status: DC
  Administered 2018-02-26: 125 mg via INTRAVENOUS
  Filled 2018-02-25: qty 2

## 2018-02-25 MED ORDER — METHYLPREDNISOLONE SODIUM SUCC 125 MG IJ SOLR: 125.0000 mg | Freq: Every day | INTRAMUSCULAR | Status: DC

## 2018-02-25 MED ORDER — ACETAMINOPHEN 500 MG PO TABS
1000.0000 mg | ORAL_TABLET | Freq: Four times a day (QID) | ORAL | Status: DC | PRN
  Administered 2018-02-25 (×2): 1000 mg via ORAL
  Filled 2018-02-25 (×3): qty 2

## 2018-02-25 MED ORDER — IPRATROPIUM-ALBUTEROL 0.5-2.5 (3) MG/3ML IN SOLN
3.0000 mL | RESPIRATORY_TRACT | Status: DC
  Filled 2018-02-25: qty 3

## 2018-02-25 NOTE — Progress Notes (Signed)
Family Medicine Teaching Service Daily Progress Note Intern Pager: [phone]  Patient name: Jamie Pennington Medical record number: [id number] Date of birth: May 11, 1956 Age: 62 y.o. Gender: female  Primary Care Provider: Carney Living, MD Consultants: None Code Status: Full  Pt Overview and Major Events to Date:  10/17: direct admit from clinic 10/18:   Assessment and Plan: Jamie Pennington is a 62 y.o. female presenting as a direct admit for a COPD exacerbation.  Failed outpatient oral prednisone and doxycycline. PMH is significant for COPD, carpal tunnel syndrome, coronary artery disease, depression, hyperlipidemia, plantar fasciitis, right elbow pain, metatarsal deformity.  COPD exacerbation, improving: Pt with diffuse wheezing on admission, now improved after 1 dose of solumedrol 125mg  and Levaquin. She has remained on room oxygen with lowest O2 at 94%. Clinically she is with minimal improvement. Wheezing has improved but exam still positive for diffuse end expiratory crackles and wheezes. She has no home oxygen requirements and uses albuterol sulfate PRN for wheezing and Pulmicort 0.5mg  nebulizer BID. Reports adherence to current regimen. Elwin Sleight was started this admission . She remains afebrile and no leukocytosis. Given overall clinical picture, very little concern for pulmonary embolus, pneumonia, viral URI, or seasonal allergies.  - Will repeat another dose of solumedrol 125mg .  - Continue Levaquin 750mg  daily (start 10/17-) - Switching duonebs to scheduled q4 hoursS - Continue dulera 200/5 mcg  - cbc and cmp - claritin 10 mg daily  Headache: Pt complains of a headache x48h. Neurologically intact, denies n/v/d. Reports tylenol has improved pain some.  -Will increase from 650 to 1000 mg of Tylenol -CTM  CAD: Recent cath on 11/2014 with EF 55-60% and 30% stenosis in circumflex and patent stent in mid RCA with only 20% stenosis. On atorvastatin, aspirin 81mg . Takes losartan,  metoprolol and has nitrostat prn for chest pain. Denies any chest pain, well controlled on current regimen. EKG reviewed and was NSR. - Continue home dose of atorvastatin, metoprolol, losartan and aspirin -Nitrostat PRN for chest pain  HTN, uncontrolled: -Continue antihypertensives per above.   Hyperlipidemia, chronic: Lipid panel 10/2017 Cholesterol 153, HDL 43, LDL 83 from 10/2017.  - continue zetia and atorvastatin   Depression, stable. Mood remains stable. - Continue 40mg  Paxil daily, can consider switching to SSRI for better side effect profile as outpatient -Will CTM  GERD: Stable while admitted. Holding prilosec while npatient -Continue Pantoprazole  FEN/GI: Heart healthy, carb modified Prophylaxis: Lovenox  Disposition: pending clinical improvement likely home  Subjective:  Pt reports she feels about the same but her wheezing has improved. Reports she has had a headache that gets a little better with Tylenol.   Objective: Temp:  [97.8 F (36.6 C)] 97.8 F (36.6 C) (10/18 0050) Pulse Rate:  [67-81] 81 (10/18 0551) Resp:  [10] 10 (10/18 0050) BP: (144-167)/(63-80) 147/69 (10/18 0050) SpO2:  [92 %-96 %] 92 % (10/18 0551) Weight:  [84.1 kg] 84.1 kg (10/17 1538) Physical Exam: General: Well appearing women in hospital bed.  Cardiovascular: Normal S2/S2, normal rate and rhythm, no murmurs or gallops Respiratory: End expiratory crackles throughout, wheezes in upper lung fields bilaterally, no signs of respiratory distress. Speaking in full sentences. Extremities: Able to move all w/o difficulty, no peripheral edema Neuro: alert and oriented, no gross focal deficits  Laboratory: Recent Labs  Lab 02/24/18 1835  WBC 8.5  HGB 14.0  HCT 45.7  PLT 332   Recent Labs  Lab 02/24/18 1835  NA 140  K 3.8  CL 104  CO2 25  BUN 18  CREATININE 0.63  CALCIUM 9.4  PROT 6.5  BILITOT 1.0  ALKPHOS 66  ALT 25  AST 28  GLUCOSE 98    Blood Cultures: NGTD <12  hours  Imaging/Diagnostic Tests:  Admission EKG: NSR  Chest X-ray 02/25/18 FINDINGS:  The cardiomediastinal contours are normal. Mild aortic  atherosclerosis. The lungs are clear. Pulmonary vasculature is  normal. No consolidation, pleural effusion, or pneumothorax. No  acute osseous abnormalities are seen.  IMPRESSION:  1. No acute findings.  2. Aortic Atherosclerosis (ICD10-I70.0).  I have seen and evaluated the patient with Medical student Vonita Moss. I am in agreement with the note above in its revised form. My additions are in blue.  Lovena Neighbours, MD Family Medicine, PGY-3  Vonita Moss, April A, Medical Student 02/25/2018, 7:21 AM  FPTS Intern pager: [phone], text pages welcome

## 2018-02-25 NOTE — Evaluation (Signed)
Physical Therapy Evaluation Patient Details Name: Jamie Pennington MRN: [id number] DOB: 1955-12-16 Today's Date: 02/25/2018   History of Present Illness  62 y.o. female presenting as a direct admit for a COPD exacerbation.  Failed outpatient oral prednisone and doxycycline.  Clinical Impression  Patient evaluated by Physical Therapy with no further acute PT needs identified. All education has been completed and the patient has no further questions. PTA, pt independent, today ambulating unit without assistance. Desats to high 80s quickly returns to low 90s with 5-10 second standing rest break. DOE 2/4 with walking which she says is not baseline, discussed energy conservation and gradual aerobic exercise after d/c. No need for AD of follow up PT.  See below for any follow-up Physical Therapy or equipment needs. PT is signing off. Thank you for this referral.     Follow Up Recommendations No PT follow up    Equipment Recommendations  None recommended by PT    Recommendations for Other Services       Precautions / Restrictions Precautions Precautions: None      Mobility  Bed Mobility Overal bed mobility: Independent                Transfers Overall transfer level: Independent Equipment used: None                Ambulation/Gait Ambulation/Gait assistance: Independent           General Gait Details: walked 250' 1 standing rest break on RA DOE 2/4, SpO2 dips to mid 80's returns quickly (2-5 seconds) to 92% with standing rest break.   Stairs            Wheelchair Mobility    Modified Rankin (Stroke Patients Only)       Balance Overall balance assessment: No apparent balance deficits (not formally assessed)                                           Pertinent Vitals/Pain Pain Assessment: Faces Pain Score: 6  Faces Pain Scale: Hurts little more Pain Location: head Pain Descriptors / Indicators: Headache Pain Intervention(s):  Limited activity within patient's tolerance;Monitored during session    Home Living Family/patient expects to be discharged to:: Private residence Living Arrangements: Alone   Type of Home: Apartment Home Access: Stairs to enter   [employer] of Steps: 1 step in from garage Home Layout: Able to live on main level with bedroom/bathroom Home Equipment: None      Prior Function Level of Independence: Independent         Comments: drives, works, no home O2     Hand Dominance   Dominant Hand: Right    Extremity/Trunk Assessment   Upper Extremity Assessment Upper Extremity Assessment: Overall WFL for tasks assessed    Lower Extremity Assessment Lower Extremity Assessment: Overall WFL for tasks assessed       Communication   Communication: No difficulties  Cognition Arousal/Alertness: Awake/alert Behavior During Therapy: WFL for tasks assessed/performed Overall Cognitive Status: Within Functional Limits for tasks assessed                                        General Comments      Exercises     Assessment/Plan    PT Assessment Patent does not need  any further PT services  PT Problem List         PT Treatment Interventions      PT Goals (Current goals can be found in the Care Plan section)  Acute Rehab PT Goals Patient Stated Goal: to feel better PT Goal Formulation: With patient    Frequency     Barriers to discharge        Co-evaluation               AM-PAC PT "6 Clicks" Daily Activity  Outcome Measure Difficulty turning over in bed (including adjusting bedclothes, sheets and blankets)?: None Difficulty moving from lying on back to sitting on the side of the bed? : None Difficulty sitting down on and standing up from a chair with arms (e.g., wheelchair, bedside commode, etc,.)?: None Help needed moving to and from a bed to chair (including a wheelchair)?: None Help needed walking in hospital room?: None Help  needed climbing 3-5 steps with a railing? : None 6 Click Score: 24    End of Session Equipment Utilized During Treatment: Gait belt Activity Tolerance: Patient tolerated treatment well Patient left: in bed;with call bell/phone within reach Nurse Communication: Mobility status PT Visit Diagnosis: Pain Pain - part of body: (headache)    Time: 1610-9604 PT Time Calculation (min) (ACUTE ONLY): 15 min   Charges:   PT Evaluation $PT Eval Low Complexity: 1 Low          Etta Grandchild, PT, DPT Acute Rehabilitation Services Pager: [phone] Office: [phone]    Etta Grandchild 02/25/2018, 12:33 PM

## 2018-02-25 NOTE — Progress Notes (Addendum)
Nutrition Consult/Brief Note  RD consulted via Inpatient COPD Exacerbation Order Set.  Wt Readings from Last 15 Encounters:  02/25/18 84 kg  02/24/18 84.1 kg  02/16/18 85.5 kg  12/01/17 84.4 kg  10/20/17 83.5 kg  06/16/17 83.3 kg  06/04/17 83.4 kg  10/02/16 75 kg  08/12/16 74.6 kg  05/21/16 73.9 kg  05/01/16 73.6 kg  02/12/16 74.8 kg  12/25/15 77.1 kg  11/27/15 79.4 kg  07/18/15 80.7 kg   Body mass index is 32.8 kg/m. Patient meets criteria for Obesity Class I based on current BMI.   Current diet order is Heart Healthy. Reports her appetite is fair. States she's "more thirsty than anything". RD brought pt cup of ice per request.   Weight has been stable per above readings. Labs and medications reviewed.   No nutrition interventions warranted at this time. If nutrition issues arise, please consult RD.   Maureen Chatters, RD, LDN Pager #: [phone] After-Hours Pager #: [phone]

## 2018-02-25 NOTE — Evaluation (Signed)
Occupational Therapy Evaluation and Discharge Patient Details Name: Jamie Pennington MRN: [id number] DOB: 11-Apr-1956 Today's Date: 02/25/2018    History of Present Illness 62 y.o. female presenting as a direct admit for a COPD exacerbation.  Failed outpatient oral prednisone and doxycycline.   Clinical Impression   Pt admitted with above and presents to OT with productive cough and reports of headache and pain along Lt side due to coughing.  Pt ambulated around room without AD, completed toilet transfers, toileting, and dynamic standing balance during LB dressing, toileting, and grooming at sink all at Independent level.  Pt will not require any OT at this time.  Will sign off.    Follow Up Recommendations  No OT follow up    Equipment Recommendations  None recommended by OT       Precautions / Restrictions Precautions Precautions: None      Mobility Bed Mobility Overal bed mobility: Independent                Transfers Overall transfer level: Independent Equipment used: None                  Balance Overall balance assessment: No apparent balance deficits (not formally assessed)                                         ADL either performed or assessed with clinical judgement   ADL Overall ADL's : Independent                                       General ADL Comments: Pt completed LB dressing, toilet transfer, and toileting all without assistance.  Pt even reaching outside BOS to obtain items while completing grooming task without any LOB.     Vision Baseline Vision/History: Wears glasses Wears Glasses: Reading only Patient Visual Report: No change from baseline Vision Assessment?: No apparent visual deficits            Pertinent Vitals/Pain Pain Assessment: 0-10 Pain Score: 6  Pain Location: head Pain Descriptors / Indicators: Headache Pain Intervention(s): Limited activity within patient's tolerance;Monitored  during session;Patient requesting pain meds-RN notified     Hand Dominance Right   Extremity/Trunk Assessment Upper Extremity Assessment Upper Extremity Assessment: Overall WFL for tasks assessed   Lower Extremity Assessment Lower Extremity Assessment: Overall WFL for tasks assessed       Communication Communication Communication: No difficulties   Cognition Arousal/Alertness: Awake/alert Behavior During Therapy: WFL for tasks assessed/performed Overall Cognitive Status: Within Functional Limits for tasks assessed                                                Home Living Family/patient expects to be discharged to:: Private residence Living Arrangements: Alone   Type of Home: House Home Access: Stairs to enter [employer] of Steps: 1 step in from garage   Home Layout: Able to live on main level with bedroom/bathroom(does not need to go upstairs)     Bathroom Shower/Tub: Producer, television/film/video: Standard     Home Equipment: None          Prior Functioning/Environment Level of Independence: Independent  OT Problem List: Pain      OT Treatment/Interventions:      OT Goals(Current goals can be found in the care plan section) Acute Rehab OT Goals Patient Stated Goal: to feel better OT Goal Formulation: All assessment and education complete, DC therapy   AM-PAC PT "6 Clicks" Daily Activity     Outcome Measure Help from another person eating meals?: None Help from another person taking care of personal grooming?: None Help from another person toileting, which includes using toliet, bedpan, or urinal?: None Help from another person bathing (including washing, rinsing, drying)?: None Help from another person to put on and taking off regular upper body clothing?: None Help from another person to put on and taking off regular lower body clothing?: None 6 Click Score: 24   End of Session Nurse  Communication: Patient requests pain meds;Mobility status  Activity Tolerance: Patient tolerated treatment well Patient left: in bed;with call bell/phone within reach  OT Visit Diagnosis: Pain Pain - part of body: (head)                Time: 1610-9604 OT Time Calculation (min): 10 min Charges:  OT General Charges $OT Visit: 1 Visit OT Evaluation $OT Eval Low Complexity: 1 Low   Rosalio Loud, [phone] 02/25/2018, 12:20 PM

## 2018-02-26 LAB — CBC WITH DIFFERENTIAL/PLATELET
Abs Immature Granulocytes: 0.13 10*3/uL — ABNORMAL HIGH (ref 0.00–0.07)
Basophils Absolute: 0 10*3/uL (ref 0.0–0.1)
Basophils Relative: 0 %
Eosinophils Absolute: 0 10*3/uL (ref 0.0–0.5)
Eosinophils Relative: 0 %
HCT: 39.9 % (ref 36.0–46.0)
Hemoglobin: 13.7 g/dL (ref 12.0–15.0)
Immature Granulocytes: 1 %
Lymphocytes Relative: 7 %
Lymphs Abs: 1.4 10*3/uL (ref 0.7–4.0)
MCH: 31.9 pg (ref 26.0–34.0)
MCHC: 34.3 g/dL (ref 30.0–36.0)
MCV: 93 fL (ref 80.0–100.0)
Monocytes Absolute: 0.7 10*3/uL (ref 0.1–1.0)
Monocytes Relative: 4 %
Neutro Abs: 17.1 10*3/uL — ABNORMAL HIGH (ref 1.7–7.7)
Neutrophils Relative %: 88 %
Platelets: 396 10*3/uL (ref 150–400)
RBC: 4.29 MIL/uL (ref 3.87–5.11)
RDW: 16.3 % — ABNORMAL HIGH (ref 11.5–15.5)
WBC: 19.4 10*3/uL — ABNORMAL HIGH (ref 4.0–10.5)
nRBC: 0 % (ref 0.0–0.2)

## 2018-02-26 MED ORDER — PREDNISONE 50 MG PO TABS: ORAL_TABLET | ORAL | 0 refills | Status: DC

## 2018-02-26 MED ORDER — MOMETASONE FURO-FORMOTEROL FUM 200-5 MCG/ACT IN AERO: 1.0000 | INHALATION_SPRAY | Freq: Two times a day (BID) | RESPIRATORY_TRACT | 0 refills | Status: DC

## 2018-02-26 MED ORDER — KETOROLAC TROMETHAMINE 30 MG/ML IJ SOLN
30.0000 mg | Freq: Once | INTRAMUSCULAR | Status: AC
  Administered 2018-02-26: 30 mg via INTRAVENOUS
  Filled 2018-02-26: qty 1

## 2018-02-26 MED ORDER — ONDANSETRON HCL 4 MG/2ML IJ SOLN
4.0000 mg | Freq: Once | INTRAMUSCULAR | Status: AC
  Administered 2018-02-26: 4 mg via INTRAVENOUS
  Filled 2018-02-26: qty 2

## 2018-02-26 MED ORDER — LEVOFLOXACIN 750 MG PO TABS: 750.0000 mg | ORAL_TABLET | Freq: Every day | ORAL | 0 refills | Status: DC

## 2018-02-26 MED ORDER — ACETAMINOPHEN 325 MG PO TABS: 650.0000 mg | ORAL_TABLET | Freq: Four times a day (QID) | ORAL | Status: DC | PRN

## 2018-02-26 MED ORDER — DIPHENHYDRAMINE HCL 50 MG/ML IJ SOLN
25.0000 mg | Freq: Once | INTRAMUSCULAR | Status: AC
  Administered 2018-02-26: 25 mg via INTRAVENOUS
  Filled 2018-02-26: qty 1

## 2018-02-26 NOTE — Progress Notes (Signed)
Family Medicine Teaching Service Daily Progress Note Intern Pager: [phone]  Patient name: Jamie Pennington Medical record number: [id number] Date of birth: 1956-03-31 Age: 62 y.o. Gender: female  Primary Care Provider: Carney Living, MD Consultants: None Code Status: Full  Pt Overview and Major Events to Date:  10/17: direct admit from clinic 10/18:   Assessment and Plan: Jamie Pennington is a 62 y.o. female presenting as a direct admit for a COPD exacerbation.  Failed outpatient oral prednisone and doxycycline. PMH is significant for COPD, carpal tunnel syndrome, coronary artery disease, depression, hyperlipidemia, plantar fasciitis, right elbow pain, metatarsal deformity.  COPD exacerbation Patient with diffuse wheezing on admission, now resolved.  Status post 2 doses of Solu-Medrol 125 and is on day 3 of Levaquin.  Also receiving duo nebs every 6 hours scheduled.  Lastly controller med change to [facility] during admission.  Patient with coarse breath sounds more consistent with mucoid obstruction and inflammation.  Likely DC later today. -We will give third dose of solumedrol 125 mg -Continue Levaquin 750mg  daily (start 10/17-) -Continue duonebs scheduled q4 hoursS -Continue Dulera 200/5 mcg  -Claritin 10 mg daily  Headache Patient reports persistent headache. Could be medication induced but has lasted for 72 hours.  Seems to be mixed picture between tension type and migraine.  Will give migraine cocktail of Zofran, Benadryl, Toradol.  Continue Tylenol 650 mg every 6 hours. - migraine cocktail x1 - Will increase from 650mg   CAD: Recent cath on 11/2014 with EF 55-60% and 30% stenosis in circumflex and patent stent in mid RCA with only 20% stenosis. On atorvastatin, aspirin 81mg . Takes losartan, metoprolol and has nitrostat prn for chest pain. Denies any chest pain, well controlled on current regimen. EKG reviewed and was NSR. - Continue home dose of atorvastatin, metoprolol, losartan  and aspirin -Nitrostat PRN for chest pain  Hypertension Last BP 141/62.  On home amlodipine 5 mg, losartan 50 mg daily.  We will continue his medication on discharge.  Hyperlipidemia, chronic Lipid panel 10/2017 Cholesterol 153, HDL 43, LDL 83 from 10/2017.  - continue zetia and atorvastatin   Depression Mood remains stable. - Continue 40mg  Paxil daily, can consider switching to SSRI for better side effect profile as outpatient  GERD: Stable while admitted. Holding prilosec while npatient -Continue Pantoprazole  FEN/GI: Heart healthy, carb modified Prophylaxis: Lovenox  Disposition: pending clinical improvement likely home  Subjective:  Patient feels like she is doing much better.  Still having significant cough and congestion but feels much better.  Tolerating good p.o.  Objective: Temp:  [97.6 F (36.4 C)-98.3 F (36.8 C)] 97.6 F (36.4 C) (10/19 0736) Pulse Rate:  [80-85] 84 (10/19 0834) Resp:  [16-18] 18 (10/19 0834) BP: (124-151)/(59-66) 141/62 (10/19 0736) SpO2:  [91 %-97 %] 91 % (10/19 0834) Weight:  [84 kg] 84 kg (10/18 1158) Physical Exam: General: Well-appearing middle-aged Caucasian female resting comfortably in bed Cardiovascular: Regular rate and rhythm, M/R/B.  Palpable peripheral pulses Respiratory: No further wheezing.  Coarse breath sounds present.  No respiratory distress, no accessory muscle use Extremities: Able to move all w/o difficulty, no peripheral edema Neuro: alert and oriented, no gross focal deficits  Laboratory: Recent Labs  Lab 02/24/18 1835 02/26/18 0220  WBC 8.5 19.4*  HGB 14.0 13.7  HCT 45.7 39.9  PLT 332 396   Recent Labs  Lab 02/24/18 1835  NA 140  K 3.8  CL 104  CO2 25  BUN 18  CREATININE 0.63  CALCIUM 9.4  PROT 6.5  BILITOT 1.0  ALKPHOS 66  ALT 25  AST 28  GLUCOSE 98    Blood Cultures: NGTD <12 hours  Imaging/Diagnostic Tests:  Admission EKG: NSR  Chest X-ray 02/25/18 FINDINGS:  The cardiomediastinal  contours are normal. Mild aortic  atherosclerosis. The lungs are clear. Pulmonary vasculature is  normal. No consolidation, pleural effusion, or pneumothorax. No  acute osseous abnormalities are seen.  IMPRESSION:  1. No acute findings.  2. Aortic Atherosclerosis (ICD10-I70.0).  Myrene Buddy MD PGY-2 Family Medicine Resident FPTS Intern pager: [phone], text pages welcome

## 2018-02-26 NOTE — Discharge Instructions (Signed)
You were admitted with a copd exacerbation You are being discharged on a 3 day course of prednisone to start on 10/20, take this daily with breakfast You are being discharged with an additional 3 days of antibiotics, please start those three tonight at bedtime  You have been switched from your fluticasone inhaler to another inhaler called dulera. This has an extra ingredient that should help prevent some of your copd exacerbations. If you are unable to afford this medication for any reason please switch back to the fluticasone. You have an appointment at the family medicine clinic on 10/25 at 3:10 with Dr. Selena Batten for a hospital follow up. Please keep this appointment or reschedule if you cannot make this time.

## 2018-02-28 NOTE — Discharge Summary (Signed)
[facility] Discharge Summary  Patient name: Jamie Pennington Medical record number: [id number] Date of birth: [birth year] Age: 62 y.o. Gender: female Date of Admission: 02/24/2018  Date of Discharge: 02/26/2018 Admitting Physician: Martyn Malay, MD  Primary Care Provider: Lind Covert, MD Consultants: none  Indication for Hospitalization: copd exacerbation  Discharge Diagnoses/Problem List:  Copd exacerbation Headache Cad Hypertension hld Depression gerd  Disposition: home  Discharge Condition: stable  Discharge Exam:  General: Well-appearing middle-aged Caucasian female resting comfortably in bed Cardiovascular: Regular rate and rhythm, M/R/B.  Palpable peripheral pulses Respiratory: No further wheezing.  Coarse breath sounds present.  No respiratory distress, no accessory muscle use Extremities: Able to move all w/o difficulty, no peripheral edema Neuro: alert and oriented, no gross focal deficits  Brief Hospital Course:  62 year old female who presented on  as a direct admit from clinic for copd exacerbation on 10/17. She was initially treated as an outpatient on 10/9 with prednisone 67m and doxycycline. She did not show any improvement and was found to have increased work of breathing in clinic prompting her admission.  COPD exacerbation In the hospital she was started on methylprednison 1268m This was continue daily until her discharge. She was also started on levaquin 75071mid. She received scheduled duonebs. She had diffuse wheezing on exam but required no O2. She improved clinically until her day of discharge on 10/19. She was transitioned from methylpred to prednisone 62m44m daily to complete a 7 day course. She was discharged to complete a 5 day course of levaquin. Her controller medication was switched to dulera from fluticasone, as she met criteria for gold C copd and was felt to benefit from laba + ics. She has clinic follow up  on 10/25. If she cannot afford the dulera she is to continue taking fluticasone until a replacement can be arranged in clinic.  Headache Headache managed with prn tylenol. Symptoms felt to be consistent with migraine and patient received cocktail x1.  Issues for Follow Up:  1. Follow up respiratory status 2. Ensure patient finished full course of prednisone and levaquin 3. Make sure patient can afford dulera, switch if too expensive  Significant Procedures:  Significant Labs and Imaging:  Recent Labs  Lab 02/24/18 1835 02/26/18 0220  WBC 8.5 19.4*  HGB 14.0 13.7  HCT 45.7 39.9  PLT 332 396   Recent Labs  Lab 02/24/18 1835  NA 140  K 3.8  CL 104  CO2 25  GLUCOSE 98  BUN 18  CREATININE 0.63  CALCIUM 9.4  ALKPHOS 66  AST 28  ALT 25  ALBUMIN 3.4*      Results/Tests Pending at Time of Discharge:   Discharge Medications:  Allergies as of 02/26/2018   No Known Allergies     Medication List    STOP taking these medications   doxycycline 100 MG tablet Commonly known as:  VIBRA-TABS     TAKE these medications   acetaminophen 500 MG tablet Commonly known as:  TYLENOL Take 1,000 mg by mouth every 6 (six) hours as needed for headache.   amLODipine 5 MG tablet Commonly known as:  NORVASC TAKE 1 TABLET BY MOUTH ONCE DAILY   aspirin 81 MG tablet Take 81 mg by mouth daily. Reported on 11/27/2015   atorvastatin 40 MG tablet Commonly known as:  LIPITOR TAKE 1 TABLET BY MOUTH EVERY DAY What changed:  when to take this   benzonatate 100 MG capsule Commonly known as:  TESSALON Take 1 capsule (100 mg total) by mouth 3 (three) times daily as needed for cough.   calcium-vitamin D 500-200 MG-UNIT tablet Commonly known as:  OSCAL WITH D Take 1 tablet by mouth 2 (two) times daily.   cetirizine 10 MG tablet Commonly known as:  ZYRTEC Take 10 mg by mouth daily as needed for allergies. Pt states that she rotates between claritin and zyrtec.   ezetimibe 10 MG  tablet Commonly known as:  ZETIA TAKE 1 TABLET BY MOUTH DAILY   levofloxacin 750 MG tablet Commonly known as:  LEVAQUIN Take 1 tablet (750 mg total) by mouth at bedtime.   losartan 50 MG tablet Commonly known as:  COZAAR TAKE 1 TABLET BY MOUTH EVERY DAY   metoprolol tartrate 25 MG tablet Commonly known as:  LOPRESSOR TAKE 1 TABLET BY MOUTH TWICE DAILY   mometasone-formoterol 200-5 MCG/ACT Aero Commonly known as:  DULERA Inhale 1 puff into the lungs 2 (two) times daily.   multivitamin tablet Take 1 tablet by mouth daily.   nitroGLYCERIN 0.4 MG SL tablet Commonly known as:  NITROSTAT Place 0.4 mg under the tongue every 5 (five) minutes as needed for chest pain (MAX 3 TABLETS).   omeprazole 40 MG capsule Commonly known as:  PRILOSEC TAKE 1 CAPSULE BY MOUTH EVERY MORNING FOR REFLUX What changed:  See the new instructions.   PARoxetine 40 MG tablet Commonly known as:  PAXIL TAKE 1 TABLET BY MOUTH ONCE DAILY   predniSONE 50 MG tablet Commonly known as:  DELTASONE Take one 63m table with breakfast each morning for 3 days What changed:  additional instructions   ranitidine 300 MG tablet Commonly known as:  ZANTAC TAKE 1 TABLET BY MOUTH EVERY EVENING AS DIRECTED FOR REFLUX What changed:  See the new instructions.   VENTOLIN HFA IN Inhale 2 puffs into the lungs as needed (Wheezing).       Discharge Instructions: Please refer to Patient Instructions section of EMR for full details.  Patient was counseled important signs and symptoms that should prompt return to medical care, changes in medications, dietary instructions, activity restrictions, and follow up appointments.   Follow-Up Appointments: Follow-up Information    KWilber Oliphant MD Follow up on 03/04/2018.   Specialty:  Family Medicine Why:  Appointment at 3:10, please arrive 15 minutes early Contact information: [id number]. [phone]           FGuadalupe Dawn  MD 02/28/2018, 7:50 PM PGY-2, [place]

## 2018-03-01 LAB — CULTURE, BLOOD (ROUTINE X 2)
Culture: NO GROWTH
Culture: NO GROWTH
Special Requests: ADEQUATE
Special Requests: ADEQUATE

## 2018-03-04 ENCOUNTER — Other Ambulatory Visit: Payer: Self-pay

## 2018-03-04 ENCOUNTER — Encounter: Payer: Self-pay | Admitting: Family Medicine

## 2018-03-04 ENCOUNTER — Ambulatory Visit: Payer: 59 | Admitting: Family Medicine

## 2018-03-04 VITALS — BP 128/68 | HR 72 | Temp 98.8°F | Ht 63.0 in | Wt 188.4 lb

## 2018-03-04 DIAGNOSIS — Z23 Encounter for immunization: Secondary | ICD-10-CM | POA: Diagnosis not present

## 2018-03-04 DIAGNOSIS — J441 Chronic obstructive pulmonary disease with (acute) exacerbation: Secondary | ICD-10-CM

## 2018-03-04 NOTE — Progress Notes (Signed)
Subjective:  PCP: Carney Living, MD Patient ID: MRN [id number]  Date of birth: 01/14/1956  HPI Jamie Pennington is a 62 y.o. female who presents to clinic for hospital follow-up of COPD exacerbation. Pt was admitted from October 17 to February 26, 2018.    #1. Reason for Admission: COPD exacerbation Pt reports doing well and has not experienced shortness of breath or difficulty breathing since discharge. Pt reports that her symptoms have greatly improved and that she otherwise feels back to normal.  She was able to get her prescriptions from the pharmacy.  She does not know how much her inhaler cost as her daughter picked it up for her.  Patient believes that she can afford her [facility] inhaler.  Pt has been compliant with medications and finished her course of prednisone and antibiotics. Pt denies medication side effects.  Pt has no other concerns today.   Review of Symptoms:  See HPI  Medications/Allergies:  Reviewed in chart  Past Medical History:   Patient Active Problem List   Diagnosis Date Noted  . Dyslipidemia, goal LDL below 70 10/20/2017  . COPD exacerbation (HCC) 05/21/2016  . Depression 11/27/2015  . Abnormal nuclear stress test   . Coronary artery disease involving native coronary artery of native heart without angina pectoris   . Fatigue 10/31/2014  . COPD (chronic obstructive pulmonary disease) (HCC) 10/31/2014  . Plantar fasciitis of right foot 11/23/2012  . Metatarsal deformity 11/23/2012  . Carpal tunnel syndrome 08/02/2012  . Dyslipidemia 10/20/2010  . Memory difficulties 02/21/2007  . HYPERTENSION, BENIGN SYSTEMIC 07/08/2006  . coronary artery disease; status post IMI and RCA stenting 2004 07/08/2006    Social History:   reports that she quit smoking about 20 years ago. She has a 20.00 pack-year smoking history. She has never used smokeless tobacco. She reports that she drank alcohol. She reports that she does not use drugs.   Objective:  BP 128/68    Pulse 72   Temp 98.8 F (37.1 C) (Oral)   Ht 5\' 3"  (1.6 m)   Wt 188 lb 6.4 oz (85.5 kg)   SpO2 96%   BMI 33.37 kg/m   Physical Exam Gen: NAD, alert, non-toxic, well-nourished, well-appearing, pleasant HEENT: Normocephaic, atraumatic. PERRLA, clear conjuctiva, no scleral icterus and injection. Normal EOM.  CV: Regular rate and rhythm.  Normal S1-S2.  No bilateral lower extremity edema. Resp: Clear to auscultation bilaterally.  No wheezing, rales, rhonchi, or other abnormal lung sounds.  No increased work of breathing appreciated. Abd: Nontender and nondistended on palpation to all 4 quadrants.  Positive bowel sounds. Skin: No obvious rashes, lesions, or trauma.  Normal turgor.  MSK: Normal ROM. Normal strength and tone.  Moves all extremities spontaneously Neuro: Cranial nerves II through VI grossly intact. Gait normal.  Alert and oriented x4.  No obvious abnormal movements. Psych: Cooperative with exam.  Normal speech. Pleasant. Makes good eye contact.  Pertinent Labs & Imaging:   Pertinent Labs Reviewed in Chart Pertinent Imaging Reviewed Assessment & Plan:  1. Need for immunization against influenza - Flu Vaccine QUAD 36+ mos IM  COPD exacerbation (HCC) Patient comes to clinic for hospital follow-up of COPD exacerbation from October 17 February 26, 2018.  She reports that she is doing well she denies any shortness of breath and reports a great improvement in her symptoms.  She feels that she is almost back to baseline.  She finished her medications at discharge and has also been able to afford her [facility] inhaler.  No other complaints today.   Genia Hotter, M.D. [facility]  PGY 1 [place] 03/04/2018, 4:36 PM

## 2018-03-04 NOTE — Progress Notes (Signed)
Issues for Follow Up:  1. Follow up respiratory status 2. Ensure patient finished full course of prednisone and levaquin 3. Make sure patient can afford dulera, switch if too expensive

## 2018-03-04 NOTE — Assessment & Plan Note (Signed)
Patient comes to clinic for hospital follow-up of COPD exacerbation from October 17 February 26, 2018.  She reports that she is doing well she denies any shortness of breath and reports a great improvement in her symptoms.  She feels that she is almost back to baseline.  She finished her medications at discharge and has also been able to afford her [facility] inhaler.  No other complaints today.

## 2018-03-14 ENCOUNTER — Other Ambulatory Visit: Payer: Self-pay | Admitting: Family Medicine

## 2018-03-24 ENCOUNTER — Other Ambulatory Visit: Payer: Self-pay | Admitting: Family Medicine

## 2018-03-30 ENCOUNTER — Other Ambulatory Visit: Payer: Self-pay | Admitting: Family Medicine

## 2018-03-31 ENCOUNTER — Other Ambulatory Visit: Payer: Self-pay

## 2018-03-31 MED ORDER — MOMETASONE FURO-FORMOTEROL FUM 200-5 MCG/ACT IN AERO: 1.0000 | INHALATION_SPRAY | Freq: Two times a day (BID) | RESPIRATORY_TRACT | 2 refills | Status: DC

## 2018-04-14 ENCOUNTER — Ambulatory Visit (INDEPENDENT_AMBULATORY_CARE_PROVIDER_SITE_OTHER): Payer: 59 | Admitting: Family Medicine

## 2018-04-14 ENCOUNTER — Encounter: Payer: Self-pay | Admitting: Family Medicine

## 2018-04-14 VITALS — BP 162/82 | HR 78 | Temp 97.8°F | Ht 63.0 in | Wt 189.6 lb

## 2018-04-14 DIAGNOSIS — M779 Enthesopathy, unspecified: Secondary | ICD-10-CM

## 2018-04-14 DIAGNOSIS — J438 Other emphysema: Secondary | ICD-10-CM | POA: Diagnosis not present

## 2018-04-14 DIAGNOSIS — M778 Other enthesopathies, not elsewhere classified: Secondary | ICD-10-CM | POA: Diagnosis not present

## 2018-04-14 DIAGNOSIS — F321 Major depressive disorder, single episode, moderate: Secondary | ICD-10-CM

## 2018-04-14 MED ORDER — DICLOFENAC SODIUM 1 % TD GEL: 2.0000 g | Freq: Four times a day (QID) | TRANSDERMAL | Status: DC

## 2018-04-14 NOTE — Patient Instructions (Addendum)
Good to see you today!  Thanks for coming in.  Schedule your colonoscopy -   I will refer you to pulmonology - If you do not hear from them in 4 weeks call me  For the tendonitis - use the diclofenac gel or Aspercreme - 4 times a day on the sore area  If pain after using the use cold compress like frozen peas  If not better in 2 months or worsening come back

## 2018-04-14 NOTE — Assessment & Plan Note (Signed)
Wors[facility].  Refer to Pulmonary medicine for further evaluation

## 2018-04-14 NOTE — Progress Notes (Signed)
Subjective  Francella SolianGail Balducci is a 62 y.o. female is presenting with the following  R ELBOW PAIN Has focally tender areas lateral and medial elbow for last few weeks. No specific injury.  Worse with movement.  Does use her arms a lot at work.  Using tylenol which helps. No weakness or loss of sensation  COPD Stable.  Taking Dulera regularly.  Still fatigued with much exertion.  No chest pain or leg swelling   Chief Complaint noted Review of Symptoms - see HPI PMH - Smoking status noted.    Objective Vital Signs reviewed BP (!) 162/82   Pulse 78   Temp 97.8 F (36.6 C) (Oral)   Ht 5\' 3"  (1.6 m)   Wt 189 lb 9.6 oz (86 kg)   SpO2 94%   BMI 33.59 kg/m  Lung - no wheezing decreased breath sounds at bases Heart - Regular rate and rhythm.  No murmurs, gallops or rubs.    R Elbow tender over lateral and less so over medial epicondyle.  Pain worse with supination and pronation.  No redness or soft tissue swelling. Distal pulses and strength intact   Assessments/Plans  See after visit summary for details of patient instuctions  COPD (chronic obstructive pulmonary disease) (HCC) Worsening symptoms not responding to [facility].  Refer to Pulmonary medicine for further evaluation   Depression She feels this is stable.  Seeing a psychologist regularly   Elbow tendonitis No signs of fracture or arthritis and no specific injury likely due to overuse.  Continue tylenol and topical nsaid and heat/ice with activity reduction.

## 2018-04-15 ENCOUNTER — Encounter: Payer: Self-pay | Admitting: Family Medicine

## 2018-04-15 DIAGNOSIS — M778 Other enthesopathies, not elsewhere classified: Secondary | ICD-10-CM | POA: Insufficient documentation

## 2018-04-15 NOTE — Assessment & Plan Note (Signed)
She feels this is stable.  Seeing a psychologist regularly

## 2018-04-15 NOTE — Assessment & Plan Note (Signed)
No signs of fracture or arthritis and no specific injury likely due to overuse.  Continue tylenol and topical nsaid and heat/ice with activity reduction.

## 2018-04-17 ENCOUNTER — Other Ambulatory Visit: Payer: Self-pay | Admitting: Family Medicine

## 2018-05-10 ENCOUNTER — Other Ambulatory Visit: Payer: Self-pay | Admitting: Family Medicine

## 2018-05-12 ENCOUNTER — Other Ambulatory Visit: Payer: Self-pay | Admitting: Family Medicine

## 2018-05-13 ENCOUNTER — Telehealth: Payer: Self-pay

## 2018-05-13 NOTE — Telephone Encounter (Signed)
Faxed request from pharmacy for Levofloxacin. Not on current med list.  Ples Specter, RN [facility] RN)

## 2018-05-25 ENCOUNTER — Ambulatory Visit: Payer: 59 | Admitting: Pulmonary Disease

## 2018-05-25 ENCOUNTER — Encounter: Payer: Self-pay | Admitting: Pulmonary Disease

## 2018-05-25 DIAGNOSIS — J439 Emphysema, unspecified: Secondary | ICD-10-CM

## 2018-05-25 NOTE — Progress Notes (Signed)
Jamie Pennington    [id number]    April 08, 1956  Primary Care Physician:Chambliss, Estill Batten, MD  Referring Physician: Carney Living, MD [address] [place], [place] [address]  Chief complaint:   Patient with a history of shortness of breath with exertion, usually when she is feeling sick  HPI:  History of COPD Symptoms usually controlled with Dulera and albuterol as needed which he uses rarely Quit smoking in 1999 Denies any chest pains or chest discomfort Occasionally has a cough, not really bringing up any secretions at present  Usually not limited when she is walking on level ground She can do about 20 steps before she starts getting winded Walking upgrade also challenges her  No pertinent occupational history  Outpatient Encounter Medications as of 05/25/2018  Medication Sig  . acetaminophen (TYLENOL) 500 MG tablet Take 1,000 mg by mouth every 6 (six) hours as needed for headache.  . Albuterol Sulfate (VENTOLIN HFA IN) Inhale 2 puffs into the lungs as needed (Wheezing).  Marland Kitchen amLODipine (NORVASC) 5 MG tablet TAKE 1 TABLET BY MOUTH ONCE DAILY (Patient taking differently: Take 5 mg by mouth daily. )  . aspirin 81 MG tablet Take 81 mg by mouth daily. Reported on 11/27/2015  . atorvastatin (LIPITOR) 40 MG tablet TAKE 1 TABLET BY MOUTH EVERY DAY (Patient taking differently: Take 40 mg by mouth every evening. )  . benzonatate (TESSALON) 100 MG capsule TAKE 1 CAPSULE(100 MG) BY MOUTH THREE TIMES DAILY AS NEEDED FOR COUGH  . calcium-vitamin D (OSCAL WITH D 500-200) 500-200 MG-UNIT per tablet Take 1 tablet by mouth 2 (two) times daily.    Marland Kitchen losartan (COZAAR) 50 MG tablet TAKE 1 TABLET BY MOUTH EVERY DAY (Patient taking differently: Take 50 mg by mouth daily. )  . metoprolol tartrate (LOPRESSOR) 25 MG tablet TAKE 1 TABLET BY MOUTH TWICE DAILY (Patient taking differently: Take 25 mg by mouth 2 (two) times daily. )  . mometasone-formoterol (DULERA) 200-5 MCG/ACT AERO  Inhale 1 puff into the lungs 2 (two) times daily.  . Multiple Vitamin (MULTIVITAMIN) tablet Take 1 tablet by mouth daily.    . nitroGLYCERIN (NITROSTAT) 0.4 MG SL tablet Place 0.4 mg under the tongue every 5 (five) minutes as needed for chest pain (MAX 3 TABLETS).  Marland Kitchen omeprazole (PRILOSEC) 40 MG capsule TAKE 1 CAPSULE BY MOUTH EVERY MORNING FOR REFLUX  . PARoxetine (PAXIL) 40 MG tablet TAKE 1 TABLET BY MOUTH ONCE DAILY  . [DISCONTINUED] cetirizine (ZYRTEC) 10 MG tablet Take 10 mg by mouth daily as needed for allergies. Pt states that she rotates between claritin and zyrtec.  . [DISCONTINUED] ezetimibe (ZETIA) 10 MG tablet TAKE 1 TABLET BY MOUTH DAILY (Patient taking differently: Take 10 mg by mouth daily. )   Facility-Administered Encounter Medications as of 05/25/2018  Medication  . diclofenac sodium (VOLTAREN) 1 % transdermal gel 2 g    Allergies as of 05/25/2018  . (No Known Allergies)    Past Medical History:  Diagnosis Date  . COPD (chronic obstructive pulmonary disease) (HCC)   . Coronary artery disease   . GERD (gastroesophageal reflux disease)   . Headache    "probably weekly" (02/24/2018)  . Hepatitis 1972   "don't remember what kind" (02/24/2018)  . Hyperlipidemia   . Hypertension   . Myocardial infarct [facility]) 2004   s/p stent placement   . Pneumonia    "once" (02/24/2018)    Past Surgical History:  Procedure Laterality Date  .  AUGMENTATION MAMMAPLASTY Bilateral ~ 2008   implants  . BREAST BIOPSY Right    "negative"  . CARDIAC CATHETERIZATION N/A 11/28/2014   Procedure: Left Heart Cath and Coronary Angiography;  Surgeon: Lennette Biharihomas A Kelly, MD;  [facility];  Service: Cardiovascular;  Laterality: N/A;  . CORONARY ANGIOPLASTY WITH STENT PLACEMENT  2004   with stent  placement to the right coronary.  Elizebeth Brooking. Cotton Osteotomy with Bone Graft Right 11/24/2012   @ [facility]  . KNEE ARTHROSCOPY Left 2010  . Plantar Endoscopic Fasciotomy Right 11/24/2012   @ [facility]     Family History  Problem Relation Age of Onset  . Stroke Father [id number]       died  . Alzheimer's disease Mother [id number]       died  . Heart attack Sister        and bypass surgery    Social History   Socioeconomic History  . Marital status: Divorced    Spouse name: Not on file  . Number of children: Not on file  . Years of education: Not on file  . Highest education level: Not on file  Occupational History  . Not on file  Social Needs  . Financial resource strain: Not on file  . Food insecurity:    Worry: Not on file    Inability: Not on file  . Transportation needs:    Medical: Not on file    Non-medical: Not on file  Tobacco Use  . Smoking status: Former Smoker    Packs/day: 1.50    Years: 20.00    Pack years: 30.00    Start date: 07/24/1971    Last attempt to quit: 01/18/1998    Years since quitting: 20.3  . Smokeless tobacco: Never Used  Substance and Sexual Activity  . Alcohol use: Not Currently    Comment: 02/24/2018 "just in my teens"  . Drug use: Never  . Sexual activity: Not Currently  Lifestyle  . Physical activity:    Days per week: Not on file    Minutes per session: Not on file  . Stress: Not on file  Relationships  . Social connections:    Talks on phone: Not on file    Gets together: Not on file    Attends religious service: Not on file    Active member of club or organization: Not on file    Attends meetings of clubs or organizations: Not on file    Relationship status: Not on file  . Intimate partner violence:    Fear of current or ex partner: Not on file    Emotionally abused: Not on file    Physically abused: Not on file    Forced sexual activity: Not on file  Other Topics Concern  . Not on file  Social History Narrative  . Not on file    Review of Systems  Constitutional: Negative.   HENT: Negative.   Eyes: Negative.   Respiratory: Negative.   Cardiovascular: Negative.   Gastrointestinal: Negative.   Endocrine: Negative.      Vitals:   05/25/18 0944  BP: (!) 142/78  Pulse: 69  SpO2: 93%     Physical Exam  Constitutional: She appears well-developed and well-nourished.  HENT:  Head: Normocephalic and atraumatic.  Eyes: Pupils are equal, round, and reactive to light. Conjunctivae are normal. Right eye exhibits no discharge.  Neck: Normal range of motion. No tracheal deviation present. No thyromegaly present.  Cardiovascular: Normal rate and regular rhythm.  Pulmonary/Chest: Effort normal and breath sounds normal. No respiratory distress. She has no wheezes.  Abdominal: Soft. Bowel sounds are normal. She exhibits no distension. There is no abdominal tenderness.   Data Reviewed: Chest x-ray 02/25/2018 shows no acute infiltrate  Assessment:  History of COPD/emphysema  Reformed smoker  Shortness of breath on activity  Plan/Recommendations: Continue use of Dulera Albuterol as needed  We will obtain a CT scan of the chest to assess extent of emphysema  Obtain a pulmonary function study  Encouraged to continue exercising on a regular basis   Virl DiamondAdewale Kellsey Sansone MD [facility] 05/25/2018, 10:04 AM  CC: Carney Livinghambliss, Marshall L, *

## 2018-05-25 NOTE — Patient Instructions (Signed)
History of emphysema/COPD Shortness of breath with exertion usually with respiratory infection  Continue Dulera Continue albuterol as needed  We will obtain a CT scan of the chest to assess extent of emphysema We will obtain a pulmonary function study Encourage to stay active, exercise regularly  I will see you back in the office in about 3 months

## 2018-06-06 ENCOUNTER — Inpatient Hospital Stay: Admission: RE | Admit: 2018-06-06 | Payer: 59 | Source: Ambulatory Visit

## 2018-06-14 ENCOUNTER — Ambulatory Visit (INDEPENDENT_AMBULATORY_CARE_PROVIDER_SITE_OTHER)
Admission: RE | Admit: 2018-06-14 | Discharge: 2018-06-14 | Disposition: A | Discharge status: Home / Self Care | Payer: 59 | Source: Ambulatory Visit | Attending: Pulmonary Disease | Admitting: Pulmonary Disease

## 2018-06-14 DIAGNOSIS — J439 Emphysema, unspecified: Secondary | ICD-10-CM

## 2018-06-15 ENCOUNTER — Other Ambulatory Visit: Payer: Self-pay | Admitting: Family Medicine

## 2018-06-21 ENCOUNTER — Other Ambulatory Visit: Payer: Self-pay | Admitting: *Deleted

## 2018-06-21 ENCOUNTER — Other Ambulatory Visit: Payer: Self-pay | Admitting: Family Medicine

## 2018-06-21 DIAGNOSIS — J439 Emphysema, unspecified: Secondary | ICD-10-CM

## 2018-06-27 ENCOUNTER — Telehealth: Payer: Self-pay | Admitting: Pulmonary Disease

## 2018-06-27 DIAGNOSIS — R918 Other nonspecific abnormal finding of lung field: Secondary | ICD-10-CM

## 2018-06-27 NOTE — Telephone Encounter (Signed)
Order CT chest without contrast for a year follow up of multiple lung nodules

## 2018-06-27 NOTE — Telephone Encounter (Signed)
FYI : PT does not qualify for lung cancer screening due to quit smoking greater than 15 years ago ( pt quit 1999). PT is aware that referral has been cancelled and you will follow her CT  Screening.

## 2018-07-04 NOTE — Telephone Encounter (Signed)
Called and left patient a detailed message to call back if needed order placed nothing further needed at this time.

## 2018-07-06 ENCOUNTER — Ambulatory Visit (INDEPENDENT_AMBULATORY_CARE_PROVIDER_SITE_OTHER): Payer: 59 | Admitting: Family Medicine

## 2018-07-06 ENCOUNTER — Other Ambulatory Visit: Payer: Self-pay

## 2018-07-06 VITALS — BP 130/80 | HR 72 | Temp 98.1°F | Ht 63.0 in | Wt 193.0 lb

## 2018-07-06 DIAGNOSIS — J441 Chronic obstructive pulmonary disease with (acute) exacerbation: Secondary | ICD-10-CM

## 2018-07-06 MED ORDER — BENZONATATE 100 MG PO CAPS: ORAL_CAPSULE | ORAL | 0 refills | Status: DC

## 2018-07-06 MED ORDER — PREDNISONE 50 MG PO TABS: ORAL_TABLET | ORAL | 0 refills | Status: DC

## 2018-07-06 MED ORDER — DOXYCYCLINE HYCLATE 100 MG PO TABS: 100.0000 mg | ORAL_TABLET | Freq: Two times a day (BID) | ORAL | 0 refills | Status: DC

## 2018-07-06 NOTE — Progress Notes (Signed)
   Subjective:   Patient ID: Jamie Pennington    DOB: 13-Jul-1955, 63 y.o. female   MRN: [id number]  CC: cough   HPI: Jamie Pennington is a 63 y.o. female who presents to clinic today for the following issue.  Cough   x2 weeks, dry and non-productive cough with congestion.   Has a h/o COPD, not on home O2. Has felt mild SOB when she is more active, feels this is worse in setting of her infection and congestion. Follows with pulmonology for this and has about 4 exacerbations a year  Felt lousy recently, having headaches, but no fever, neck stiffness or chills reported Reports runny nose and now has progressed to drainage, no sore throat  Eating less due to decreased appetite but has been drinking well - mainly water and teas  No sick contacts at home however some sick people at work  [employer] cold tabs and other OTC medications which have not provided much relief  No seasonal allergies  ROS: See HPI for pertinent ROS.  Social: former tobacco use, 16 pack year, quit 1999 Medications reviewed. Objective:   BP 130/80   Pulse 72   Temp 98.1 F (36.7 C) (Oral)   Ht 5\' 3"  (1.6 m)   Wt 193 lb (87.5 kg)   SpO2 93%   BMI 34.19 kg/m  Vitals and nursing note reviewed.  General: 63 yo female, NAD  HEENT: NCAT, EOMI, PERRL, MMM, oropharynx clear Neck: supple, normal ROM  CV: RRR no MRG  Lungs: CTAB, normal effort, mild expiratory wheeze bilaterally, no crackles or rales  Abdomen: soft, +bs  Skin: warm, dry, no rash  Extremities: warm and well perfused   Assessment & Plan:   Cough Presents with cough and diffuse wheezing likely in setting of COPD exacerbation.  Will treat as such with antibiotics and short burst of steroids.  Instructed scheduled use of albuterol inhaler 2 puffs q4 hours.  Vitals are stable with good O2 sat on pulse ox, is otherwise well appearing and in no distress.  -Doxycycline 100 mg BID x 10 days  -Prednisone 50 mg tabs x 5 days  -Rx: tessalon pearles    Strict return precautions given   Meds ordered this encounter  Medications  . doxycycline (VIBRA-TABS) 100 MG tablet    Sig: Take 1 tablet (100 mg total) by mouth 2 (two) times daily.    Dispense:  20 tablet    Refill:  0  . predniSONE (DELTASONE) 50 MG tablet    Sig: Take one tablet daily by mouth.    Dispense:  5 tablet    Refill:  0  . benzonatate (TESSALON) 100 MG capsule    Sig: TAKE 1 CAPSULE(100 MG) BY MOUTH THREE TIMES DAILY AS NEEDED FOR COUGH    Dispense:  30 capsule    Refill:  0   Freddrick March, MD [facility] PGY-3

## 2018-07-06 NOTE — Patient Instructions (Signed)
It was nice meeting you today.  You were seen in clinic for worsening cough which is likely due to a COPD exacerbation.  I am prescribing you a course of antibiotics to take twice daily for the next 10 days in addition to 5 days of prednisone.  We also reviewed reasons to seek medical care at the ED if needed for worsening symptoms.  Please call clinic if you have any questions.  I hope you feel better soon.  Freddrick March MD

## 2018-08-01 ENCOUNTER — Other Ambulatory Visit: Payer: Self-pay | Admitting: Family Medicine

## 2018-08-04 ENCOUNTER — Encounter: Payer: Self-pay | Admitting: Family Medicine

## 2018-08-22 ENCOUNTER — Telehealth: Payer: Self-pay | Admitting: *Deleted

## 2018-08-22 MED ORDER — FAMOTIDINE 40 MG PO TABS: 40.0000 mg | ORAL_TABLET | Freq: Every day | ORAL | 1 refills | Status: DC

## 2018-08-22 NOTE — Telephone Encounter (Signed)
Received fax from pharmacy.  Ranitidine dc'd can we send in famotidine instead? Jone Baseman, CMA

## 2018-08-22 NOTE — Telephone Encounter (Signed)
Sent in Famotidine 40 mg daily 

## 2018-09-01 ENCOUNTER — Ambulatory Visit: Payer: 59 | Admitting: Pulmonary Disease

## 2018-09-02 ENCOUNTER — Encounter: Payer: Self-pay | Admitting: Family Medicine

## 2018-10-02 ENCOUNTER — Other Ambulatory Visit: Payer: Self-pay | Admitting: Family Medicine

## 2018-10-06 ENCOUNTER — Other Ambulatory Visit: Payer: Self-pay | Admitting: Pulmonary Disease

## 2018-10-28 ENCOUNTER — Other Ambulatory Visit (HOSPITAL_COMMUNITY)
Admission: RE | Admit: 2018-10-28 | Discharge: 2018-10-28 | Disposition: A | Discharge status: Home / Self Care | Payer: 59 | Source: Ambulatory Visit | Attending: Pulmonary Disease | Admitting: Pulmonary Disease

## 2018-10-28 ENCOUNTER — Telehealth: Payer: Self-pay | Admitting: *Deleted

## 2018-10-28 NOTE — Progress Notes (Signed)
Covid screen rescheduled for 6/22 

## 2018-10-31 ENCOUNTER — Other Ambulatory Visit (HOSPITAL_COMMUNITY)
Admission: RE | Admit: 2018-10-31 | Discharge: 2018-10-31 | Disposition: A | Discharge status: Home / Self Care | Payer: 59 | Source: Ambulatory Visit | Attending: Pulmonary Disease | Admitting: Pulmonary Disease

## 2018-10-31 DIAGNOSIS — Z1159 Encounter for screening for other viral diseases: Secondary | ICD-10-CM | POA: Insufficient documentation

## 2018-10-31 LAB — SARS CORONAVIRUS 2 (TAT 6-24 HRS): SARS Coronavirus 2: NEGATIVE

## 2018-11-01 ENCOUNTER — Other Ambulatory Visit: Payer: Self-pay | Admitting: Cardiology

## 2018-11-01 ENCOUNTER — Other Ambulatory Visit: Payer: Self-pay | Admitting: Internal Medicine

## 2018-11-01 ENCOUNTER — Other Ambulatory Visit: Payer: Self-pay | Admitting: Family Medicine

## 2018-11-02 ENCOUNTER — Ambulatory Visit: Payer: 59 | Admitting: Nurse Practitioner

## 2018-11-02 ENCOUNTER — Telehealth: Payer: Self-pay | Admitting: Internal Medicine

## 2018-11-02 ENCOUNTER — Ambulatory Visit (INDEPENDENT_AMBULATORY_CARE_PROVIDER_SITE_OTHER): Payer: 59 | Admitting: Pulmonary Disease

## 2018-11-02 ENCOUNTER — Other Ambulatory Visit: Payer: Self-pay

## 2018-11-02 ENCOUNTER — Encounter: Payer: Self-pay | Admitting: Nurse Practitioner

## 2018-11-02 DIAGNOSIS — J410 Simple chronic bronchitis: Secondary | ICD-10-CM

## 2018-11-02 DIAGNOSIS — J439 Emphysema, unspecified: Secondary | ICD-10-CM | POA: Diagnosis not present

## 2018-11-02 LAB — PULMONARY FUNCTION TEST
DL/VA % pred: 70 %
DL/VA: 2.94 ml/min/mmHg/L
DLCO unc % pred: 60 %
DLCO unc: 12.08 ml/min/mmHg
FEF 25-75 Post: 1.14 L/sec
FEF 25-75 Pre: 0.64 L/sec
FEF2575-%Change-Post: 78 %
FEF2575-%Pred-Post: 52 %
FEF2575-%Pred-Pre: 29 %
FEV1-%Change-Post: 10 %
FEV1-%Pred-Post: 70 %
FEV1-%Pred-Pre: 63 %
FEV1-Post: 1.72 L
FEV1-Pre: 1.56 L
FEV1FVC-%Change-Post: 4 %
FEV1FVC-%Pred-Pre: 84 %
FEV6-%Change-Post: 7 %
FEV6-%Pred-Post: 81 %
FEV6-%Pred-Pre: 76 %
FEV6-Post: 2.5 L
FEV6-Pre: 2.33 L
FEV6FVC-%Change-Post: 1 %
FEV6FVC-%Pred-Post: 103 %
FEV6FVC-%Pred-Pre: 101 %
FVC-%Change-Post: 5 %
FVC-%Pred-Post: 78 %
FVC-%Pred-Pre: 74 %
FVC-Post: 2.52 L
FVC-Pre: 2.38 L
Post FEV1/FVC ratio: 68 %
Post FEV6/FVC ratio: 99 %
Pre FEV1/FVC ratio: 65 %
Pre FEV6/FVC Ratio: 98 %
RV % pred: 113 %
RV: 2.34 L
TLC % pred: 97 %
TLC: 4.91 L

## 2018-11-02 MED ORDER — PANTOPRAZOLE SODIUM 20 MG PO TBEC: 20.0000 mg | DELAYED_RELEASE_TABLET | Freq: Every day | ORAL | 0 refills | Status: DC

## 2018-11-02 MED ORDER — BUDESONIDE-FORMOTEROL FUMARATE 160-4.5 MCG/ACT IN AERO: 2.0000 | INHALATION_SPRAY | Freq: Two times a day (BID) | RESPIRATORY_TRACT | 0 refills | Status: DC

## 2018-11-02 MED ORDER — SPIRIVA RESPIMAT 2.5 MCG/ACT IN AERS: 2.0000 | INHALATION_SPRAY | Freq: Every day | RESPIRATORY_TRACT | 0 refills | Status: DC

## 2018-11-02 MED ORDER — MONTELUKAST SODIUM 10 MG PO TABS: 10.0000 mg | ORAL_TABLET | Freq: Every day | ORAL | 11 refills | Status: DC

## 2018-11-02 NOTE — Progress Notes (Signed)
PFT done today. 

## 2018-11-02 NOTE — Patient Instructions (Signed)
May stop dulera Will trial Symbicort 2 puffs twice daily Will trial Spiriva respimat 2 puffs daily Will trial Singulair Continue albuterol as needed Will order Protonix to start in place of Prilosec Patient will need follow up CT scan ordered in February 2021  Follow up: Follow up with Jamie Pennington in 2 months or sooner if needed

## 2018-11-02 NOTE — Telephone Encounter (Signed)
New Message    Left voicemail for pt ro return call to confirm appt and give consent

## 2018-11-02 NOTE — Telephone Encounter (Signed)
Follow up  ° ° ° °CONSENT FOR TELE-HEALTH VISIT - PLEASE REVIEW TO PATIENT ° °I hereby voluntarily request, consent and authorize [facility] and its employed or contracted physicians, physician assistants, nurse practitioners or other licensed health care professionals (the Practitioner), to provide me with telemedicine health care services (the "Services") as deemed necessary by the treating Practitioner. I acknowledge and consent to receive the Services by the Practitioner via telemedicine. I understand that the telemedicine visit will involve communicating with the Practitioner through live audiovisual communication technology and the disclosure of certain medical information by electronic transmission. I acknowledge that I have been given the opportunity to request an in-person assessment or other available alternative prior to the telemedicine visit and am voluntarily participating in the telemedicine visit. ° °I understand that I have the right to withhold or withdraw my consent to the use of telemedicine in the course of my care at any time, without affecting my right to future care or treatment, and that the Practitioner or I may terminate the ° °telemedicine visit at any time. I understand that I have the right to inspect all information obtained and/or recorded in the course of the telemedicine visit and may receive copies of available information for a reasonable fee. I understand that some of the potential risks of receiving the Services via telemedicine include: ° °Delay or interruption in medical evaluation due to technological equipment failure or disruption; ° °Information transmitted may not be sufficient (e.g. poor resolution of images) to allow for appropriate medical decision making by the Practitioner; and/or ° °In rare instances, security protocols could fail, causing a breach of personal health information. ° °Furthermore, I acknowledge that it is my responsibility to provide information  about my medical history, conditions and care that is complete and accurate to the best of my ability. I acknowledge that Practitioner's advice, recommendations, and/or decision may be based on factors not within their control, such as incomplete or inaccurate data provided by me or distortions of diagnostic images or specimens that may result from electronic transmissions. I understand that the practice of medicine is not an exact science and that Practitioner makes no warranties or guarantees regarding treatment outcomes. I acknowledge that I will receive a copy of this consent concurrently upon execution via email to the email address I last provided but may also request a printed copy by calling the office of [facility]. ° °I understand that my insurance will be billed for this visit. ° °I have read or had this consent read to me. ° °I understand the contents of this consent, which adequately explains the benefits and risks of the Services being provided via telemedicine. ° °I have been provided ample opportunity to ask questions regarding this consent and the Services and have had my questions answered to my satisfaction. ° °I give my informed consent for the services to be provided through the use of telemedicine in my medical care ° °By participating in this telemedicine visit I agree to the above °YES  °

## 2018-11-02 NOTE — Progress Notes (Signed)
 @Patient  ID: Francella SolianGail Duren, female    DOB: [birth year], 63 y.o.   MRN: [id number]  Chief Complaint  Patient presents with   Results    Discuss PFT results.    Referring provider: Carney Livinghambliss, Marshall L, *  HPI 63 year old female former smoker with COPD who is followed by Dr. Wynona Neatlalere  Tests: CT chest 06/14/18 - Moderate centrilobular emphysema. Multiple small pulmonary nodules measuring 4 mm or smaller. CT follow-up is optional at 12 months for these nodules in the setting of high risk factors for lung cancer. Consider ongoing annual low-dose CT lung cancer screening indicated by patient age, smoking history, or other risk factors for lung cancer. Coronary artery disease.  PFT Results Latest Ref Rng & Units 11/02/2018  FVC-Pre L 2.38  FVC-Predicted Pre % 74  FVC-Post L 2.52  FVC-Predicted Post % 78  Pre FEV1/FVC % % 65  Post FEV1/FCV % % 68  FEV1-Pre L 1.56  FEV1-Predicted Pre % 63  FEV1-Post L 1.72  DLCO UNC% % 60  DLCO COR %Predicted % 70  TLC L 4.91  TLC % Predicted % 97  RV % Predicted % 113     OV 11/03/18 - Follow up with PFT Patient has a follow-up visit today with PFT.  Does have a history of emphysema/COPD.  Her PFT does show COPD Gold stage II.  Discussed this in office today.  Patient has also had a CT scan which did show multiple pulmonary nodules and emphysema.  Patient will need a follow-up CT scan in 12 months.  Patient states since her last visit with Dr. Wynona Neatlalere on 05/25/2018 that she has noticed increasing shortness of breath with exertion/ADLs.  He states that Elwin SleightDulera is not helping her.  Does use albuterol as needed.  Denies any significant wheezing or cough. Denies f/c/s, n/v/d, hemoptysis, PND, leg swelling.     No Known Allergies  Immunization History  Administered Date(s) Administered   Influenza Split 02/11/2011   Influenza Whole 02/21/2007   Influenza,inj,Quad PF,6+ Mos 05/28/2015, 02/12/2016, 06/16/2017, 03/04/2018   Influenza-Unspecified  04/10/2014   Pneumococcal Conjugate-13 05/28/2015   Td 02/08/1997    Past Medical History:  Diagnosis Date   COPD (chronic obstructive pulmonary disease) (HCC)    Coronary artery disease    GERD (gastroesophageal reflux disease)    Headache    "probably weekly" (02/24/2018)   Hepatitis 1972   "don't remember what kind" (02/24/2018)   Hyperlipidemia    Hypertension    Myocardial infarct (HCC) 2004   s/p stent placement    Pneumonia    "once" (02/24/2018)    Tobacco History: Social History   Tobacco Use  Smoking Status Former Smoker   Packs/day: 1.50   Years: 20.00   Pack years: 30.00   Start date: 07/24/1971   Quit date: 01/18/1998   Years since quitting: 20.8  Smokeless Tobacco Never Used   Counseling given: Not Answered   Outpatient Encounter Medications as of 11/02/2018  Medication Sig   Albuterol Sulfate (VENTOLIN HFA IN) Inhale 2 puffs into the lungs as needed (Wheezing).   aspirin 81 MG tablet Take 81 mg by mouth daily. Reported on 11/27/2015   benzonatate (TESSALON) 100 MG capsule TAKE 1 CAPSULE(100 MG) BY MOUTH THREE TIMES DAILY AS NEEDED FOR COUGH   calcium-vitamin D (OSCAL WITH D 500-200) 500-200 MG-UNIT per tablet Take 1 tablet by mouth 2 (two) times daily.     doxycycline (VIBRA-TABS) 100 MG tablet Take 1 tablet (100 mg total) by mouth 2 (two)  times daily.   DULERA 200-5 MCG/ACT AERO INHALE 1 PUFF INTO THE LUNGS TWICE DAILY   famotidine (PEPCID) 40 MG tablet Take 1 tablet (40 mg total) by mouth daily.   losartan (COZAAR) 50 MG tablet Take 1 tablet (50 mg total) by mouth daily.   Multiple Vitamin (MULTIVITAMIN) tablet Take 1 tablet by mouth daily.     nitroGLYCERIN (NITROSTAT) 0.4 MG SL tablet Place 0.4 mg under the tongue every 5 (five) minutes as needed for chest pain (MAX 3 TABLETS).   omeprazole (PRILOSEC) 40 MG capsule TAKE 1 CAPSULE BY MOUTH EVERY MORNING FOR REFLUX   PARoxetine (PAXIL) 40 MG tablet TAKE 1 TABLET BY MOUTH  EVERY DAY   [DISCONTINUED] amLODipine (NORVASC) 5 MG tablet TAKE 1 TABLET BY MOUTH ONCE DAILY (Patient taking differently: Take 5 mg by mouth daily. )   [DISCONTINUED] atorvastatin (LIPITOR) 40 MG tablet TAKE 1 TABLET BY MOUTH EVERY DAY (Patient taking differently: Take 40 mg by mouth every evening. )   [DISCONTINUED] metoprolol tartrate (LOPRESSOR) 25 MG tablet TAKE 1 TABLET BY MOUTH TWICE DAILY (Patient taking differently: Take 25 mg by mouth 2 (two) times daily. )   acetaminophen (TYLENOL) 500 MG tablet Take 1,000 mg by mouth every 6 (six) hours as needed for headache.   budesonide-formoterol (SYMBICORT) 160-4.5 MCG/ACT inhaler Inhale 2 puffs into the lungs 2 (two) times daily.   montelukast (SINGULAIR) 10 MG tablet Take 1 tablet (10 mg total) by mouth at bedtime. (Patient not taking: Reported on 11/03/2018)   pantoprazole (PROTONIX) 20 MG tablet Take 1 tablet (20 mg total) by mouth daily.   Tiotropium Bromide Monohydrate (SPIRIVA RESPIMAT) 2.5 MCG/ACT AERS Inhale 2 puffs into the lungs daily.   [DISCONTINUED] predniSONE (DELTASONE) 50 MG tablet Take one tablet daily by mouth. (Patient not taking: Reported on 11/02/2018)   Facility-Administered Encounter Medications as of 11/02/2018  Medication   diclofenac sodium (VOLTAREN) 1 % transdermal gel 2 g     Review of Systems  Review of Systems  Constitutional: Negative.  Negative for chills and fever.  HENT: Negative.   Respiratory: Positive for shortness of breath. Negative for cough and wheezing.   Cardiovascular: Negative.  Negative for chest pain, palpitations and leg swelling.  Gastrointestinal: Negative.   Allergic/Immunologic: Negative.   Neurological: Negative.   Psychiatric/Behavioral: Negative.        Physical Exam  BP 126/66 (BP Location: Left Arm, Patient Position: Sitting, Cuff Size: Normal)    Pulse 64    Temp 97.9 F (36.6 C)    Ht 5\' 4"  (1.626 m)    Wt 192 lb (87.1 kg)    SpO2 94%    BMI 32.96 kg/m   Wt  Readings from Last 5 Encounters:  11/03/18 190 lb (86.2 kg)  11/02/18 192 lb (87.1 kg)  07/06/18 193 lb (87.5 kg)  05/25/18 190 lb 3.2 oz (86.3 kg)  04/14/18 189 lb 9.6 oz (86 kg)     Physical Exam Vitals signs and nursing note reviewed.  Constitutional:      General: She is not in acute distress.    Appearance: She is well-developed.  Cardiovascular:     Rate and Rhythm: Normal rate and regular rhythm.  Pulmonary:     Effort: Pulmonary effort is normal. No respiratory distress.     Breath sounds: Normal breath sounds. No wheezing or rhonchi.  Musculoskeletal:        General: No swelling.  Neurological:     Mental Status: She is alert and  oriented to person, place, and time.       Assessment & Plan:   COPD (chronic obstructive pulmonary disease) (HCC) Patient has a follow-up visit today with PFT.  Does have a history of emphysema/COPD.  Her PFT does show COPD Gold stage II.  Discussed this in office today.  Patient has also had a CT scan which did show multiple pulmonary nodules and emphysema.  Patient will need a follow-up CT scan in 12 months.  Patient states since her last visit with Dr. Wynona Neatlalere on 05/25/2018 that she has noticed increasing shortness of breath with exertion/ADLs.  He states that Elwin SleightDulera is not helping her.  Does use albuterol as needed.  Denies any significant wheezing or cough.  Patient Instructions  May stop dulera Will trial Symbicort 2 puffs twice daily Will trial Spiriva respimat 2 puffs daily Will trial Singulair Continue albuterol as needed Will order Protonix to start in place of Prilosec Patient will need follow up CT scan ordered in February 2021  Follow up: Follow up with Dr. Wynona Neatlalere in 2 months or sooner if needed         Ivonne Andrewonya S Taccara Bushnell, NP 11/03/2018

## 2018-11-03 ENCOUNTER — Other Ambulatory Visit: Payer: Self-pay

## 2018-11-03 ENCOUNTER — Encounter: Payer: Self-pay | Admitting: Nurse Practitioner

## 2018-11-03 ENCOUNTER — Encounter: Payer: Self-pay | Admitting: Internal Medicine

## 2018-11-03 ENCOUNTER — Telehealth (INDEPENDENT_AMBULATORY_CARE_PROVIDER_SITE_OTHER): Payer: 59 | Admitting: Internal Medicine

## 2018-11-03 VITALS — Ht 64.0 in | Wt 190.0 lb

## 2018-11-03 DIAGNOSIS — I2510 Atherosclerotic heart disease of native coronary artery without angina pectoris: Secondary | ICD-10-CM | POA: Diagnosis not present

## 2018-11-03 DIAGNOSIS — E785 Hyperlipidemia, unspecified: Secondary | ICD-10-CM

## 2018-11-03 DIAGNOSIS — I10 Essential (primary) hypertension: Secondary | ICD-10-CM

## 2018-11-03 DIAGNOSIS — J410 Simple chronic bronchitis: Secondary | ICD-10-CM

## 2018-11-03 NOTE — Patient Instructions (Signed)
Medication Instructions:  No changes  If you need a refill on your cardiac medications before your next appointment, please call your pharmacy.   Lab work: Call our office to schedule blood work if PCP does not do next week.  If you have labs (blood work) drawn today and your tests are completely normal, you will receive your results only by: Marland Kitchen MyChart Message (if you have MyChart) OR . A paper copy in the mail If you have any lab test that is abnormal or we need to change your treatment, we will call you to review the results.  Testing/Procedures: none2  Follow-Up: At [facility], you and your health needs are our priority.  As part of our continuing mission to provide you with exceptional heart care, we have created designated Provider Care Teams.  These Care Teams include your primary Cardiologist (physician) and Advanced Practice Providers (APPs -  Physician Assistants and Nurse Practitioners) who all work together to provide you with the care you need, when you need it. You will need a follow up appointment in:  6-7 months.  Please call our office 2 months in advance to schedule this appointment.  You may see Dorris Carnes, MD or one of the following Advanced Practice Providers on your designated Care Team: Richardson Dopp, PA-C [place], [place] . Daune Perch, NP  Any Other Special Instructions Will Be Listed Below (If Applicable).

## 2018-11-03 NOTE — Progress Notes (Signed)
Virtual Visit via Telephone Note   This visit type was conducted due to national recommendations for restrictions regarding the COVID-19 Pandemic (e.g. social distancing) in an effort to limit this patient's exposure and mitigate transmission in our community.  Due to her co-morbid illnesses, this patient is at least at moderate risk for complications without adequate follow up.  This format is felt to be most appropriate for this patient at this time.  The patient did not have access to video technology/had technical difficulties with video requiring transitioning to audio format only (telephone).  All issues noted in this document were discussed and addressed.  No physical exam could be performed with this format.  Please refer to the patient's chart for her  consent to telehealth for [facility].   Date:  11/03/2018   ID:  Jamie Pennington, DOB Dec 31, 1955, MRN [id number]  Patient Location: Home Provider Location: Home  PCP:  Carney Livinghambliss, Marshall L, MD  Cardiologist:  Ethan Kasperski  Evaluation Performed:  Follow-Up Visit  Chief Complaint:  F/U of CAD   History of Present Illness:    Jamie SolianGail Pennington is a 63 y.o. female with hx of CAD (s/p PTCA/stent in 2004 to RCA)   Cath in 2016 minimal CAD   Patent stent    Pt also has a hisstory of HTN, COPD I saw the pt in 2018   She was seen by Cleotis NipperJ Hammond in 2019 Echo ordered for fatigue in AUg 2019   LVEF normal  Admitted with COPD exacerbation in October 2019  She says she is feeling pretty good  No wheezing  Breathing is OK   No CP No dizziness  No edema    The patient does not have symptoms concerning for COVID-19 infection (fever, chills, cough, or new shortness of breath).    Past Medical History:  Diagnosis Date  . COPD (chronic obstructive pulmonary disease) (HCC)   . Coronary artery disease   . GERD (gastroesophageal reflux disease)   . Headache    "probably weekly" (02/24/2018)  . Hepatitis 1972   "don't remember what kind" (02/24/2018)   . Hyperlipidemia   . Hypertension   . Myocardial infarct [facility](HCC) 2004   s/p stent placement   . Pneumonia    "once" (02/24/2018)   Past Surgical History:  Procedure Laterality Date  . AUGMENTATION MAMMAPLASTY Bilateral ~ 2008   implants  . BREAST BIOPSY Right    "negative"  . CARDIAC CATHETERIZATION N/A 11/28/2014   Procedure: Left Heart Cath and Coronary Angiography;  Surgeon: Lennette Biharihomas A Kelly, MD;  [facility];  Service: Cardiovascular;  Laterality: N/A;  . CORONARY ANGIOPLASTY WITH STENT PLACEMENT  2004   with stent  placement to the right coronary.  Elizebeth Brooking. Cotton Osteotomy with Bone Graft Right 11/24/2012   @ [facility]  . KNEE ARTHROSCOPY Left 2010  . Plantar Endoscopic Fasciotomy Right 11/24/2012   @ [facility]     Current Meds  Medication Sig  . acetaminophen (TYLENOL) 500 MG tablet Take 1,000 mg by mouth every 6 (six) hours as needed for headache.  . Albuterol Sulfate (VENTOLIN HFA IN) Inhale 2 puffs into the lungs as needed (Wheezing).  Marland Kitchen. amLODipine (NORVASC) 5 MG tablet TAKE 1 TABLET BY MOUTH EVERY DAY  . aspirin 81 MG tablet Take 81 mg by mouth daily. Reported on 11/27/2015  . atorvastatin (LIPITOR) 40 MG tablet TAKE 1 TABLET BY MOUTH EVERY DAY  . benzonatate (TESSALON) 100 MG capsule TAKE 1 CAPSULE(100 MG) BY MOUTH THREE TIMES  DAILY AS NEEDED FOR COUGH  . budesonide-formoterol (SYMBICORT) 160-4.5 MCG/ACT inhaler Inhale 2 puffs into the lungs 2 (two) times daily.  . calcium-vitamin D (OSCAL WITH D 500-200) 500-200 MG-UNIT per tablet Take 1 tablet by mouth 2 (two) times daily.    Marland Kitchen doxycycline (VIBRA-TABS) 100 MG tablet Take 1 tablet (100 mg total) by mouth 2 (two) times daily.  . DULERA 200-5 MCG/ACT AERO INHALE 1 PUFF INTO THE LUNGS TWICE DAILY  . famotidine (PEPCID) 40 MG tablet Take 1 tablet (40 mg total) by mouth daily.  Marland Kitchen losartan (COZAAR) 50 MG tablet Take 1 tablet (50 mg total) by mouth daily.  . metoprolol tartrate (LOPRESSOR) 25 MG tablet TAKE 1 TABLET BY MOUTH TWICE  DAILY  . Multiple Vitamin (MULTIVITAMIN) tablet Take 1 tablet by mouth daily.    . nitroGLYCERIN (NITROSTAT) 0.4 MG SL tablet Place 0.4 mg under the tongue every 5 (five) minutes as needed for chest pain (MAX 3 TABLETS).  Marland Kitchen omeprazole (PRILOSEC) 40 MG capsule TAKE 1 CAPSULE BY MOUTH EVERY MORNING FOR REFLUX  . pantoprazole (PROTONIX) 20 MG tablet Take 1 tablet (20 mg total) by mouth daily.  Marland Kitchen PARoxetine (PAXIL) 40 MG tablet TAKE 1 TABLET BY MOUTH EVERY DAY  . Tiotropium Bromide Monohydrate (SPIRIVA RESPIMAT) 2.5 MCG/ACT AERS Inhale 2 puffs into the lungs daily.   Current Facility-Administered Medications for the 11/03/18 encounter (Telemedicine) with Fay Records, MD  Medication  . diclofenac sodium (VOLTAREN) 1 % transdermal gel 2 g     Allergies:   Patient has no known allergies.   Social History   Tobacco Use  . Smoking status: Former Smoker    Packs/day: 1.50    Years: 20.00    Pack years: 30.00    Start date: 07/24/1971    Quit date: 01/18/1998    Years since quitting: 20.8  . Smokeless tobacco: Never Used  Substance Use Topics  . Alcohol use: Not Currently    Comment: 02/24/2018 "just in my teens"  . Drug use: Never     Family Hx: The patient's family history includes Alzheimer's disease (age of onset: 47) in her mother; Heart attack in her sister; Stroke (age of onset: 26) in her father.  ROS:   Please see the history of present illness.     All other systems reviewed and are negative.   Prior CV studies:   The following studies were reviewed today: Echo 12/2017 noted above      Labs/Other Tests and Data Reviewed:    EKG:  No ECG reviewed.  Recent Labs: 12/01/2017: TSH 2.100 02/24/2018: ALT 25; BUN 18; Creatinine, Ser 0.63; Potassium 3.8; Sodium 140 02/26/2018: Hemoglobin 13.7; Platelets 396   Recent Lipid Panel Lab Results  Component Value Date/Time   CHOL 155 10/20/2017 10:30 AM   TRIG 144 10/20/2017 10:30 AM   HDL 43 10/20/2017 10:30 AM   CHOLHDL 3.6  10/20/2017 10:30 AM   CHOLHDL 3.6 06/20/2015 08:49 AM   LDLCALC 83 10/20/2017 10:30 AM   LDLDIRECT 99 08/02/2012 09:17 AM    Wt Readings from Last 3 Encounters:  11/03/18 190 lb (86.2 kg)  11/02/18 192 lb (87.1 kg)  07/06/18 193 lb (87.5 kg)     Objective:    Vital Signs:  Ht 5\' 4"  (1.626 m)   Wt 190 lb (86.2 kg)   BMI 32.61 kg/m    No vitals  ASSESSMENT & PLAN:    1. CAD   No symptoms to sugg angina  Follow  2   HTN   Has not taken BP   Cuff not working   I encouraged her to get another cuff Check periodically  3   HL   Lipids last checked in JUne 2019  LDL 83   HDL 43  SHe has appt with Dr Deirdre PriestHambliss next week  WIll ask him to forward for review     4  COPD   Currently doing OK   PFTs just done    5  COVID-19 Education: The signs and symptoms of COVID-19 were discussed with the patient and how to seek care for testing (follow up with PCP or arrange E-visit).  The importance of social distancing was discussed today.  Time:   Today, I have spent 20 minutes with the patient with telehealth technology discussing the above problems.     Medication Adjustments/Labs and Tests Ordered: Current medicines are reviewed at length with the patient today.  Concerns regarding medicines are outlined above.   Tests Ordered: No orders of the defined types were placed in this encounter.   Medication Changes: No orders of the defined types were placed in this encounter.   Follow Up:  Virtual Visit Feb/ March  Signed, Sharlyne Koeneman, MD  11/03/2018 9:18 AM    [facility]

## 2018-11-03 NOTE — Telephone Encounter (Signed)
telehealth consent  

## 2018-11-03 NOTE — Assessment & Plan Note (Signed)
Patient has a follow-up visit today with PFT.  Does have a history of emphysema/COPD.  Her PFT does show COPD Gold stage II.  Discussed this in office today.  Patient has also had a CT scan which did show multiple pulmonary nodules and emphysema.  Patient will need a follow-up CT scan in 12 months.  Patient states since her last visit with Dr. Ander Slade on 05/25/2018 that she has noticed increasing shortness of breath with exertion/ADLs.  He states that Ruthe Mannan is not helping her.  Does use albuterol as needed.  Denies any significant wheezing or cough.  Patient Instructions  May stop dulera Will trial Symbicort 2 puffs twice daily Will trial Spiriva respimat 2 puffs daily Will trial Singulair Continue albuterol as needed Will order Protonix to start in place of Prilosec Patient will need follow up CT scan ordered in February 2021  Follow up: Follow up with Dr. Ander Slade in 2 months or sooner if needed

## 2018-11-08 ENCOUNTER — Other Ambulatory Visit: Payer: Self-pay

## 2018-11-08 ENCOUNTER — Telehealth (INDEPENDENT_AMBULATORY_CARE_PROVIDER_SITE_OTHER): Payer: 59 | Admitting: Family Medicine

## 2018-11-08 DIAGNOSIS — F321 Major depressive disorder, single episode, moderate: Secondary | ICD-10-CM

## 2018-11-08 DIAGNOSIS — E785 Hyperlipidemia, unspecified: Secondary | ICD-10-CM

## 2018-11-08 DIAGNOSIS — I10 Essential (primary) hypertension: Secondary | ICD-10-CM

## 2018-11-08 NOTE — Assessment & Plan Note (Signed)
Stable.  Continue current medications.

## 2018-11-08 NOTE — Assessment & Plan Note (Signed)
Will order FLP.  She will come in for blood test when she feels is safe

## 2018-11-08 NOTE — Progress Notes (Signed)
[place] Telemedicine Visit  Patient consented to have virtual visit. Method of visit: Telephone  Encounter participants: Patient: Jamie Pennington - located at home Provider: Lind Covert - located at office Others (if applicable): no  Chief Complaint: FOLLOW UP  ANXIETY DEPRESSION Fairly stable despite covid crisis.  Able to do what she needs at home and go to work intermittently.  Thoughts are stable.  No suicidal ideation.  Sleeping well.  HYPERLIPIDEMIA Symptoms Chest pain on exertion:  no   Leg claudication:   no Medications (modifying factor): Compliance- daily lipitor Right upper quadrant pain- no  Muscle aches- no Duration - years   Timing - continuous    Component Value Date/Time   CHOL 155 10/20/2017 1030   TRIG 144 10/20/2017 1030   HDL 43 10/20/2017 1030   LDLDIRECT 99 08/02/2012 0917   VLDL 24 06/20/2015 0849   CHOLHDL 3.6 10/20/2017 1030   CHOLHDL 3.6 06/20/2015 0849   ELBOW Pain Has resolved.  Has full use and no swelling    HYPERTENSION Disease Monitoring  Home BP Monitoring (Severity) last check at pulmonologist 120/66 Symptoms - Chest pain- no    Dyspnea- stable Medications (Modifying factors) Compliance-  Taking all her meds. Lightheadedness-  no  Edema- mild resolves during the night Timing - continuous  Duration - years ROS - See HPI  PMH Lab Review   Potassium  Date Value Ref Range Status  02/24/2018 3.8 3.5 - 5.1 mmol/L Final    Comment:    SLIGHT HEMOLYSIS   Sodium  Date Value Ref Range Status  02/24/2018 140 135 - 145 mmol/L Final  10/20/2017 142 134 - 144 mmol/L Final   Creat  Date Value Ref Range Status  06/20/2015 0.64 0.50 - 0.99 mg/dL Final   Creatinine, Ser  Date Value Ref Range Status  02/24/2018 0.63 0.44 - 1.00 mg/dL Final       ROS: per HPI  Pertinent PMHx: significant COPD  Exam:  Respiratory: normal speech patterns Psych:  Cognition and judgment appear intact. Alert,  communicative  and cooperative with normal attention span and concentration. No apparent delusions, illusions, hallucinations   Assessment/Plan:  Depression Stable. Continue current medications   Dyslipidemia, goal LDL below 70 Will order FLP.  She will come in for blood test when she feels is safe   HYPERTENSION, BENIGN SYSTEMIC Sounds controlled.  Will check future labs     Time spent during visit with patient: 11 minutes

## 2018-11-08 NOTE — Assessment & Plan Note (Signed)
Sounds controlled.  Will check future labs

## 2018-11-29 ENCOUNTER — Telehealth: Payer: Self-pay | Admitting: *Deleted

## 2018-11-29 NOTE — Telephone Encounter (Signed)
Received fax from pharmacy, PA needed on omeprazole.  Clinical questions submitted via Cover My Meds.  Approved, pharmacy informed.  Cover My Meds info: Key: Brandt Loosen, CMA

## 2018-12-01 ENCOUNTER — Other Ambulatory Visit: Payer: Self-pay | Admitting: Internal Medicine

## 2018-12-01 NOTE — Telephone Encounter (Signed)
Medication no longer on med list.  Should pt still be taking this?  I do not see where is was discontinued.

## 2018-12-21 ENCOUNTER — Telehealth: Payer: Self-pay | Admitting: Pulmonary Disease

## 2018-12-21 MED ORDER — SPIRIVA RESPIMAT 2.5 MCG/ACT IN AERS: 2.0000 | INHALATION_SPRAY | Freq: Every day | RESPIRATORY_TRACT | 5 refills | Status: DC

## 2018-12-21 NOTE — Telephone Encounter (Signed)
Called and spoke w/ pt. Pt states she needs a prescription for Spiriva 2.5 to go to [facility]. She states she still has two weeks worth of her Symbicort 160 sample left and will call once she gets low on that for a prescription.  AO's Notes from office visit 11/02/2018: Will trial Symbicort 2 puffs twice daily Will trial Spiriva respimat 2 puffs daily  I let her know we would get an order for Spiriva 2.5 sent to her preferred pharmacy and to let us know when she will need a prescription for Symbicort 160. Pt expressed understanding with no additional questions. Order has been placed. Nothing further needed at this time.

## 2019-01-02 NOTE — Progress Notes (Signed)
@Patient  ID: Jamie Pennington, female    DOB: 11-26-1955, 63 y.o.   MRN: [id number]  Chief Complaint  Patient presents with  . Follow-up    SInus congestion/HA/cough (sounds congested) "allergy flare up"    Referring provider: Lind Covert, *  HPI: 63 year old female, former smoker quit 1999. PMH significant for COPD GOLD 2, CAD, HTN. Patient of Dr. Ander Slade, seen for initial consult on 05/25/18 for shortness of breath with exertion. Maintained on Symbicort, Spiriva, singulair Added Singulair.   01/03/2019 Patient presents today for acute visit with complaints of allergies, sinus congestion and HA x 2 weeks. Associated chest congestion and shortness of breath. Wakes up every morning with a headache. She has been taking tylenol and Claritin for the last two weeks ago. Using ocean nasal spray. She is taking is taking Spiriva and Symbicort ONCE daily. She has been taking Singulair. Denies fever, sick contact, loss of smell/taste.    Tests: CT chest 06/14/18 - Moderate centrilobular emphysema. Multiple small pulmonary nodules measuring 4 mm or smaller. CT follow-up is optional at 12 months for these nodules in the setting of high risk factors for lung cancer. Consider ongoing annual low-dose CT lung cancer screening indicated by patient age, smoking history, or other risk factors for lung cancer. Coronary artery disease.  No Known Allergies  Immunization History  Administered Date(s) Administered  . Influenza Split 02/11/2011  . Influenza Whole 02/21/2007  . Influenza,inj,Quad PF,6+ Mos 05/28/2015, 02/12/2016, 06/16/2017, 03/04/2018  . Influenza-Unspecified 04/10/2014  . Pneumococcal Conjugate-13 05/28/2015  . Td 02/08/1997    Past Medical History:  Diagnosis Date  . COPD (chronic obstructive pulmonary disease) ([place])   . Coronary artery disease   . GERD (gastroesophageal reflux disease)   . Headache    "probably weekly" (02/24/2018)  . Hepatitis 1972   "don't remember what  kind" (02/24/2018)  . Hyperlipidemia   . Hypertension   . Myocardial infarct [facility]) 2004   s/p stent placement   . Pneumonia    "once" (02/24/2018)    Tobacco History: Social History   Tobacco Use  Smoking Status Former Smoker  . Packs/day: 1.50  . Years: 20.00  . Pack years: 30.00  . Start date: 07/24/1971  . Quit date: 01/18/1998  . Years since quitting: 20.9  Smokeless Tobacco Never Used   Counseling given: Not Answered   Outpatient Medications Prior to Visit  Medication Sig Dispense Refill  . acetaminophen (TYLENOL) 500 MG tablet Take 1,000 mg by mouth every 6 (six) hours as needed for headache.    . Albuterol Sulfate (VENTOLIN HFA IN) Inhale 2 puffs into the lungs as needed (Wheezing).    Marland Kitchen amLODipine (NORVASC) 5 MG tablet TAKE 1 TABLET BY MOUTH EVERY DAY 90 tablet 0  . aspirin 81 MG tablet Take 81 mg by mouth daily. Reported on 11/27/2015    . atorvastatin (LIPITOR) 40 MG tablet TAKE 1 TABLET BY MOUTH EVERY DAY 90 tablet 0  . benzonatate (TESSALON) 100 MG capsule TAKE 1 CAPSULE(100 MG) BY MOUTH THREE TIMES DAILY AS NEEDED FOR COUGH 30 capsule 0  . calcium-vitamin D (OSCAL WITH D 500-200) 500-200 MG-UNIT per tablet Take 1 tablet by mouth 2 (two) times daily.      Marland Kitchen ezetimibe (ZETIA) 10 MG tablet Take 1 tablet (10 mg total) by mouth daily. 90 tablet 3  . famotidine (PEPCID) 40 MG tablet Take 1 tablet (40 mg total) by mouth daily. (Patient taking differently: Take 40 mg by mouth at bedtime. )  90 tablet 1  . losartan (COZAAR) 50 MG tablet Take 1 tablet (50 mg total) by mouth daily. 90 tablet 1  . metoprolol tartrate (LOPRESSOR) 25 MG tablet TAKE 1 TABLET BY MOUTH TWICE DAILY 180 tablet 0  . montelukast (SINGULAIR) 10 MG tablet Take 1 tablet (10 mg total) by mouth at bedtime. 30 tablet 11  . Multiple Vitamin (MULTIVITAMIN) tablet Take 1 tablet by mouth daily.      . nitroGLYCERIN (NITROSTAT) 0.4 MG SL tablet Place 0.4 mg under the tongue every 5 (five) minutes as needed for chest  pain (MAX 3 TABLETS).    . pantoprazole (PROTONIX) 20 MG tablet Take 1 tablet (20 mg total) by mouth daily. 30 tablet 0  . PARoxetine (PAXIL) 40 MG tablet TAKE 1 TABLET BY MOUTH EVERY DAY 90 tablet 2  . Tiotropium Bromide Monohydrate (SPIRIVA RESPIMAT) 2.5 MCG/ACT AERS Inhale 2 puffs into the lungs daily. 1 g 5  . budesonide-formoterol (SYMBICORT) 160-4.5 MCG/ACT inhaler Inhale 2 puffs into the lungs 2 (two) times daily. 1 Inhaler 0  . doxycycline (VIBRA-TABS) 100 MG tablet Take 1 tablet (100 mg total) by mouth 2 (two) times daily. 20 tablet 0  . DULERA 200-5 MCG/ACT AERO INHALE 1 PUFF INTO THE LUNGS TWICE DAILY (Patient not taking: Reported on 01/03/2019) 17.6 g 6  . omeprazole (PRILOSEC) 40 MG capsule TAKE 1 CAPSULE BY MOUTH EVERY MORNING FOR REFLUX (Patient not taking: Reported on 01/03/2019) 90 capsule 2   Facility-Administered Medications Prior to Visit  Medication Dose Route Frequency Provider Last Rate Last Dose  . diclofenac sodium (VOLTAREN) 1 % transdermal gel 2 g  2 g Topical QID Chambliss, Estill BattenMarshall L, MD        Review of Systems  Review of Systems  HENT: Positive for congestion and sinus pressure.   Respiratory: Positive for cough and shortness of breath. Negative for wheezing.    Physical Exam  BP 130/74 (BP Location: Right Arm, Patient Position: Sitting, Cuff Size: Normal)   Pulse 68   Temp 97.6 F (36.4 C)   Ht 5\' 3"  (1.6 m)   Wt 196 lb (88.9 kg)   SpO2 96%   BMI 34.72 kg/m  Physical Exam Constitutional:      Appearance: Normal appearance.  HENT:     Head: Normocephalic and atraumatic.     Nose: Congestion present.  Neck:     Musculoskeletal: Normal range of motion and neck supple.  Cardiovascular:     Rate and Rhythm: Normal rate and regular rhythm.  Pulmonary:     Effort: Pulmonary effort is normal.     Breath sounds: Normal breath sounds. No wheezing or rhonchi.  Musculoskeletal: Normal range of motion.  Skin:    General: Skin is warm and dry.   Neurological:     General: No focal deficit present.     Mental Status: She is alert and oriented to person, place, and time. Mental status is at baseline.  Psychiatric:        Mood and Affect: Mood normal.        Behavior: Behavior normal.        Thought Content: Thought content normal.        Judgment: Judgment normal.      Lab Results:  CBC    Component Value Date/Time   WBC 19.4 (H) 02/26/2018 0220   RBC 4.29 02/26/2018 0220   HGB 13.7 02/26/2018 0220   HGB 14.2 10/20/2017 1030   HCT 39.9 02/26/2018 0220  HCT 41.8 10/20/2017 1030   PLT 396 02/26/2018 0220   PLT 327 10/20/2017 1030   MCV 93.0 02/26/2018 0220   MCV 87 10/20/2017 1030   MCH 31.9 02/26/2018 0220   MCHC 34.3 02/26/2018 0220   RDW 16.3 (H) 02/26/2018 0220   RDW 14.4 10/20/2017 1030   LYMPHSABS 1.4 02/26/2018 0220   MONOABS 0.7 02/26/2018 0220   EOSABS 0.0 02/26/2018 0220   BASOSABS 0.0 02/26/2018 0220    BMET    Component Value Date/Time   NA 140 02/24/2018 1835   NA 142 10/20/2017 1030   K 3.8 02/24/2018 1835   CL 104 02/24/2018 1835   CO2 25 02/24/2018 1835   GLUCOSE 98 02/24/2018 1835   BUN 18 02/24/2018 1835   BUN 21 10/20/2017 1030   CREATININE 0.63 02/24/2018 1835   CREATININE 0.64 06/20/2015 0849   CALCIUM 9.4 02/24/2018 1835   GFRNONAA >60 02/24/2018 1835   GFRAA >60 02/24/2018 1835    BNP No results found for: BNP  ProBNP    Component Value Date/Time   PROBNP 106.0 (H) 11/06/2014 1449    Imaging: No results found.   Assessment & Plan:   Sinusitis - Rx Augmentin 1 tab twice daily x 7 days - Continue tylenol prn HA, Claritin and ocean nasal spray - Adding Flonase nasal spray  COPD (chronic obstructive pulmonary disease) (HCC) - No wheezing on exam  - Continue Symbicort TWICE daily - Continue Spiriva ONCE daily - PRN albuterol 2 puffs every 4-6 hours for sob/wheezing   Pulmonary nodules - Needs repeat CT chest wo contrast in Feb 2021   Glenford BayleyElizabeth W , NP  01/03/2019

## 2019-01-03 ENCOUNTER — Other Ambulatory Visit: Payer: Self-pay

## 2019-01-03 ENCOUNTER — Ambulatory Visit: Payer: 59 | Admitting: Nurse Practitioner

## 2019-01-03 ENCOUNTER — Ambulatory Visit: Payer: 59 | Admitting: Primary Care

## 2019-01-03 ENCOUNTER — Encounter: Payer: Self-pay | Admitting: Primary Care

## 2019-01-03 DIAGNOSIS — J0100 Acute maxillary sinusitis, unspecified: Secondary | ICD-10-CM | POA: Diagnosis not present

## 2019-01-03 DIAGNOSIS — J329 Chronic sinusitis, unspecified: Secondary | ICD-10-CM | POA: Insufficient documentation

## 2019-01-03 DIAGNOSIS — R918 Other nonspecific abnormal finding of lung field: Secondary | ICD-10-CM | POA: Diagnosis not present

## 2019-01-03 MED ORDER — AMOXICILLIN-POT CLAVULANATE 875-125 MG PO TABS: 1.0000 | ORAL_TABLET | Freq: Two times a day (BID) | ORAL | 0 refills | Status: DC

## 2019-01-03 MED ORDER — BUDESONIDE-FORMOTEROL FUMARATE 160-4.5 MCG/ACT IN AERO: 2.0000 | INHALATION_SPRAY | Freq: Two times a day (BID) | RESPIRATORY_TRACT | 6 refills | Status: DC

## 2019-01-03 NOTE — Assessment & Plan Note (Signed)
-   Rx Augmentin 1 tab twice daily x 7 days - Continue tylenol prn HA, Claritin and ocean nasal spray - Adding Flonase nasal spray

## 2019-01-03 NOTE — Assessment & Plan Note (Signed)
-   Needs repeat CT chest wo contrast in Feb 2021

## 2019-01-03 NOTE — Assessment & Plan Note (Addendum)
-   No wheezing on exam  - Continue Symbicort TWICE daily - Continue Spiriva ONCE daily - PRN albuterol 2 puffs every 4-6 hours for sob/wheezing

## 2019-01-03 NOTE — Patient Instructions (Addendum)
Due for follow-up CT chest February 2021 re: pulmonary nodules   Continue Symbicort- two puffs twice daily (refill sent) Continue Spiriva - two puffs once daily  Continue Singulair 10mg  at bedtime  Continue Claritin daily  Continue ocean nasal spray twice daily  Add flonase nasal spray once daily  Rx: Augmentin 1 tab twice daily x 7 days  Follow-up: 3-6 months with Dr. Ander Slade or sooner if needed

## 2019-01-31 ENCOUNTER — Other Ambulatory Visit: Payer: Self-pay | Admitting: Internal Medicine

## 2019-01-31 ENCOUNTER — Other Ambulatory Visit: Payer: Self-pay | Admitting: Cardiology

## 2019-02-20 ENCOUNTER — Other Ambulatory Visit: Payer: Self-pay | Admitting: Family Medicine

## 2019-03-16 IMAGING — DX DG CERVICAL SPINE 2 OR 3 VIEWS
3 series · 3 of 3 positions shown · non-contrast
Comparison: None.

CLINICAL DATA: Cervicalgia for 4 weeks with limited range of motion

EXAM:
CERVICAL SPINE - 2-3 VIEW

[dg cervical spine 2 or 3 views (1 of 3)]
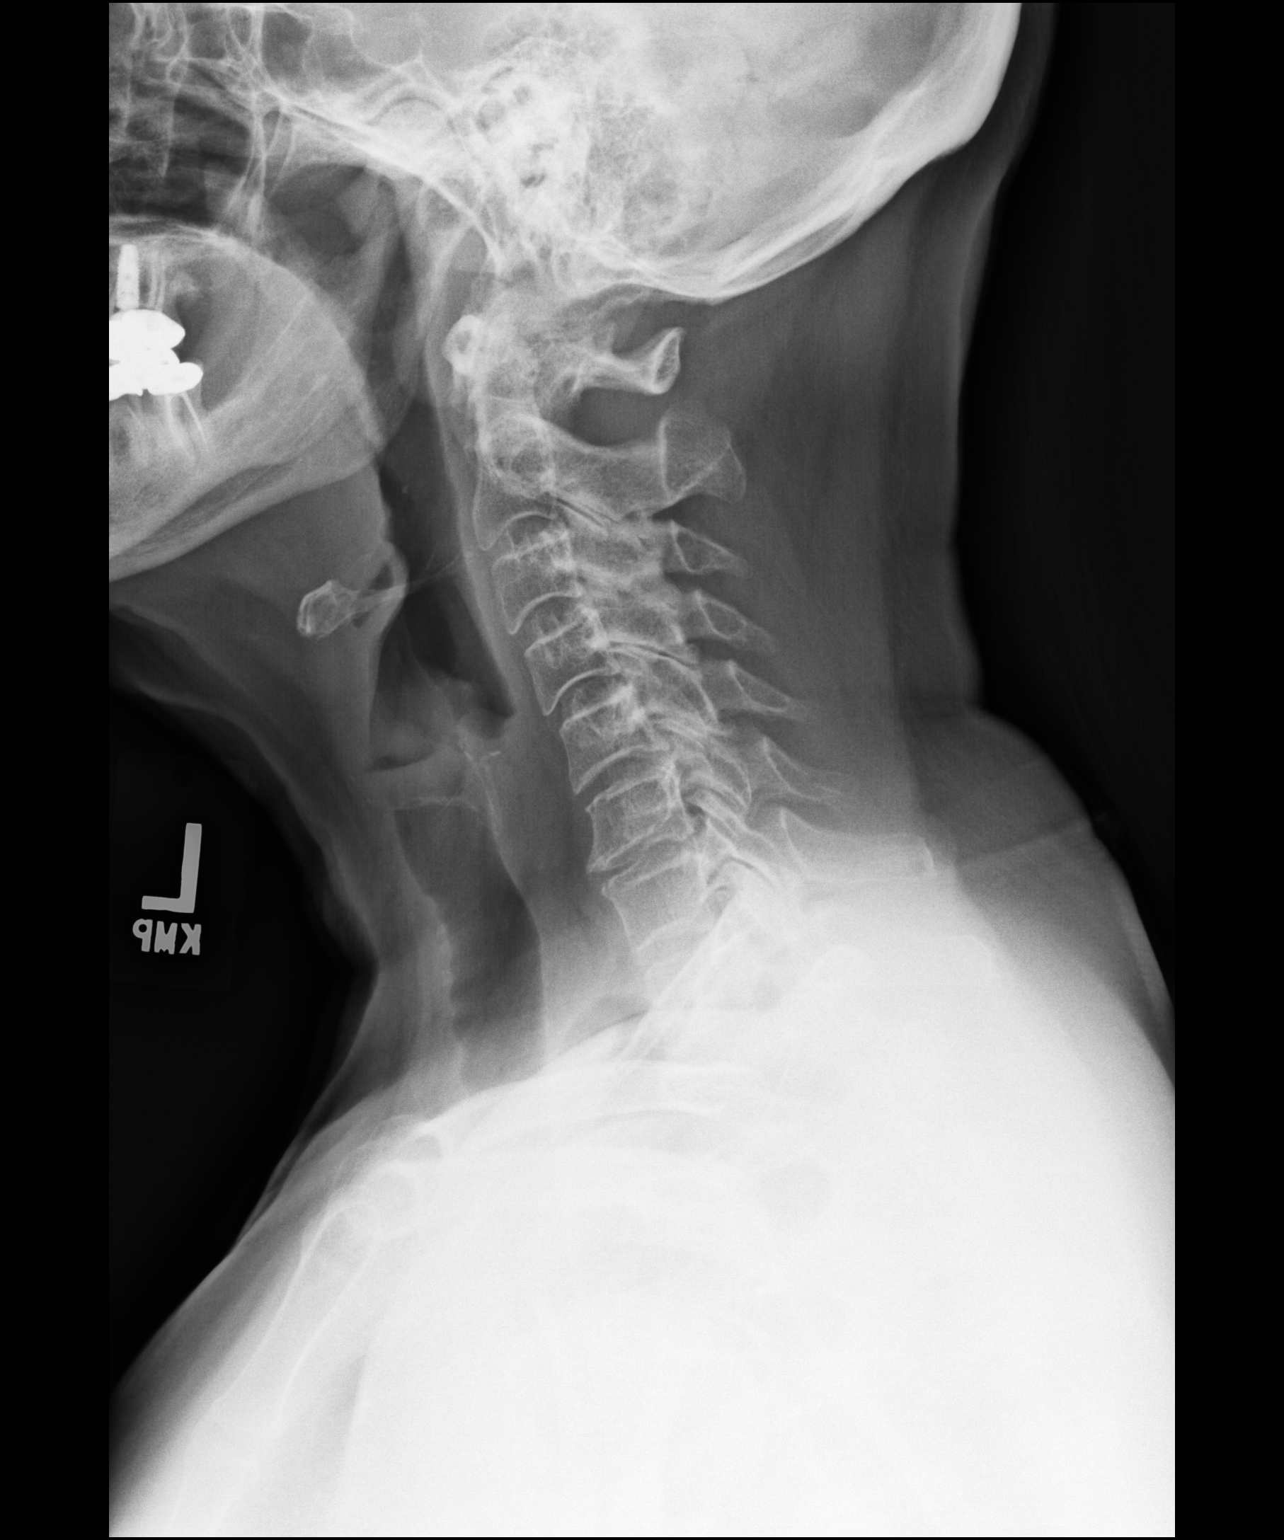

[dg cervical spine 2 or 3 views (2 of 3)]
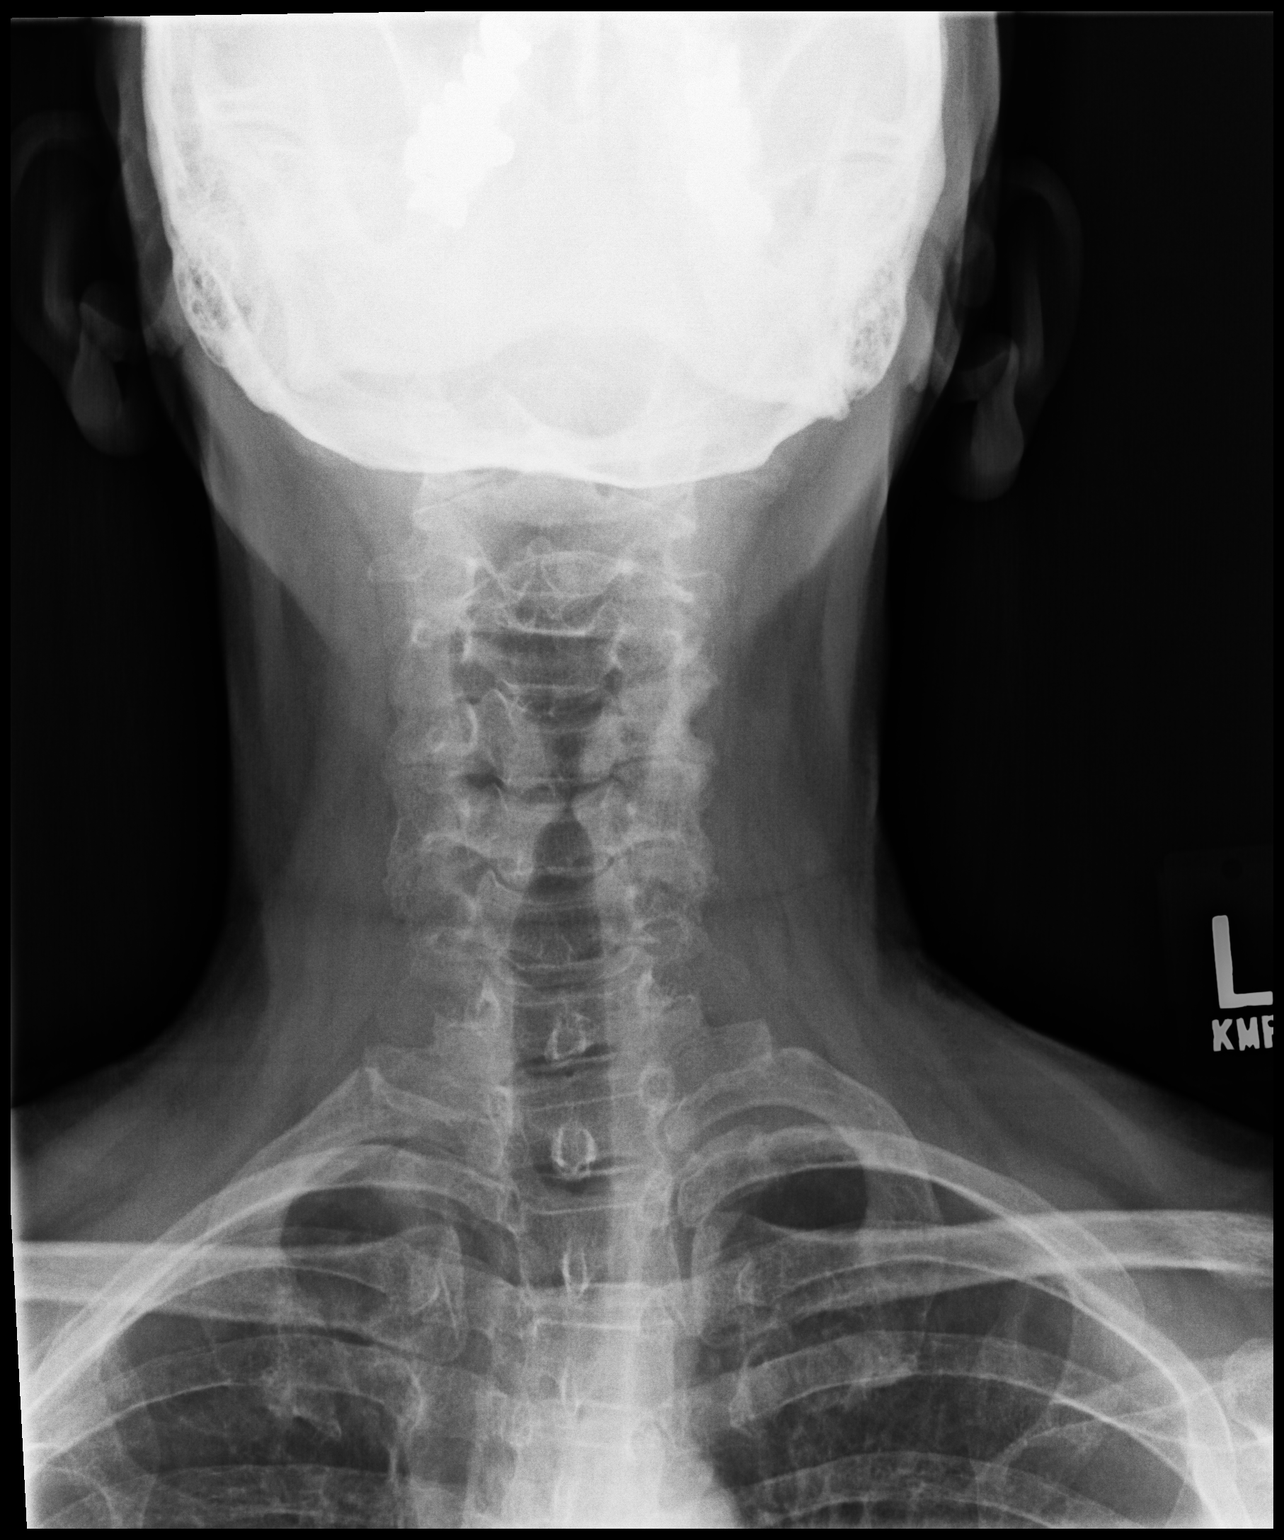

[dg cervical spine 2 or 3 views (3 of 3)]
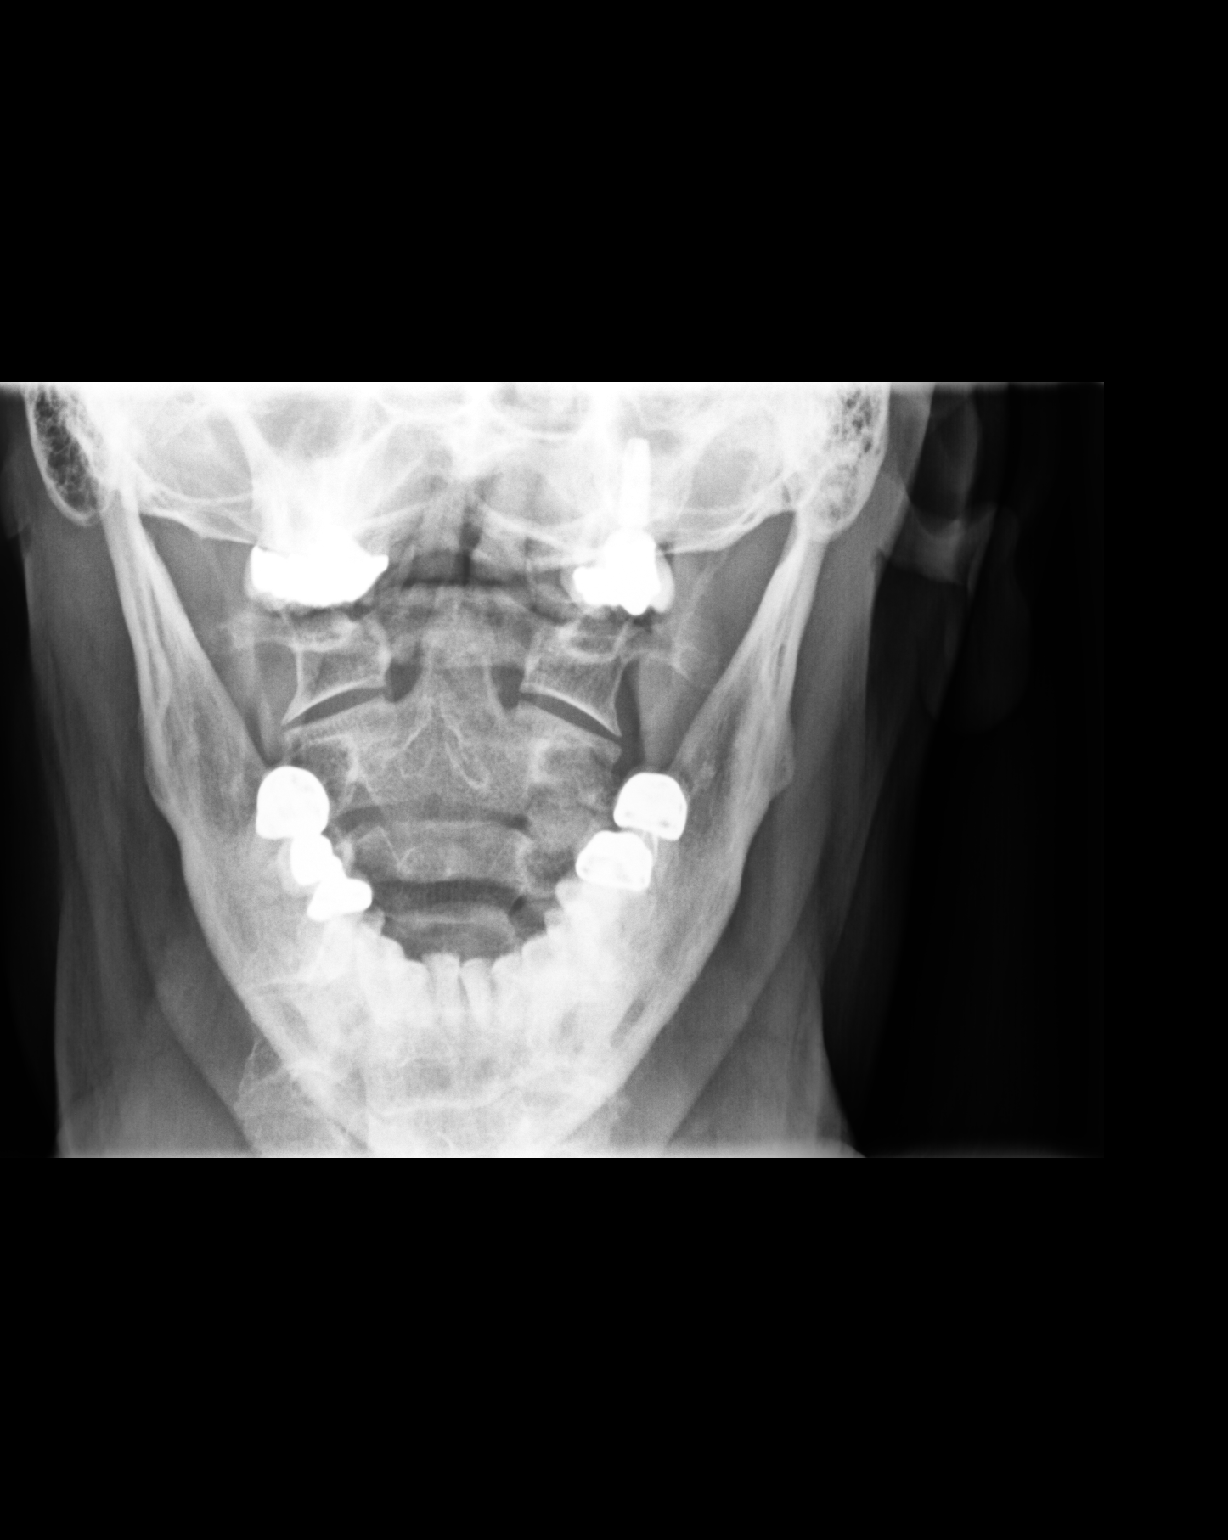

[3 of 3 positions shown; findings below may reference images not displayed]

FINDINGS: Frontal, lateral, and open-mouth odontoid images were obtained.
There is slight cervicothoracic levoscoliosis. There is no fracture
or spondylolisthesis. Prevertebral soft tissues and predental space
regions are normal. There is moderately severe disc space narrowing
at C6-7. There is mild disc space narrowing at C5-6 and C7-T1. No
erosive change. There is relative lack of lordosis. Lung apices are
clear.
IMPRESSION: Osteoarthritic change in the lower cervical region. No fracture or
spondylolisthesis. Lack of lordosis most likely is due to muscle
spasm.

## 2019-03-20 ENCOUNTER — Other Ambulatory Visit: Payer: Self-pay

## 2019-03-20 ENCOUNTER — Other Ambulatory Visit: Payer: 59

## 2019-03-20 ENCOUNTER — Ambulatory Visit (INDEPENDENT_AMBULATORY_CARE_PROVIDER_SITE_OTHER): Payer: 59 | Admitting: *Deleted

## 2019-03-20 DIAGNOSIS — I10 Essential (primary) hypertension: Secondary | ICD-10-CM

## 2019-03-20 DIAGNOSIS — Z23 Encounter for immunization: Secondary | ICD-10-CM

## 2019-03-20 DIAGNOSIS — E785 Hyperlipidemia, unspecified: Secondary | ICD-10-CM

## 2019-03-20 NOTE — Progress Notes (Signed)
Pt tolerated vaccine well. Waynetta Metheny C Hinata Diener, CMA  

## 2019-03-21 ENCOUNTER — Other Ambulatory Visit: Payer: Self-pay | Admitting: Family Medicine

## 2019-03-21 LAB — CMP14+EGFR
ALT: 31 IU/L (ref 0–32)
AST: 25 IU/L (ref 0–40)
Albumin/Globulin Ratio: 1.6 (ref 1.2–2.2)
Albumin: 3.9 g/dL (ref 3.8–4.8)
Alkaline Phosphatase: 98 IU/L (ref 39–117)
BUN/Creatinine Ratio: 14 (ref 12–28)
BUN: 10 mg/dL (ref 8–27)
Bilirubin Total: 0.3 mg/dL (ref 0.0–1.2)
CO2: 22 mmol/L (ref 20–29)
Calcium: 9 mg/dL (ref 8.7–10.3)
Chloride: 104 mmol/L (ref 96–106)
Creatinine, Ser: 0.74 mg/dL (ref 0.57–1.00)
GFR calc Af Amer: 99 mL/min/{1.73_m2} (ref >59)
GFR calc non Af Amer: 86 mL/min/{1.73_m2} (ref >59)
Globulin, Total: 2.5 g/dL (ref 1.5–4.5)
Glucose: 105 mg/dL — ABNORMAL HIGH (ref 65–99)
Potassium: 4.1 mmol/L (ref 3.5–5.2)
Sodium: 141 mmol/L (ref 134–144)
Total Protein: 6.4 g/dL (ref 6.0–8.5)

## 2019-03-21 LAB — LIPID PANEL
Chol/HDL Ratio: 3.4 ratio (ref 0.0–4.4)
Cholesterol, Total: 166 mg/dL (ref 100–199)
HDL: 49 mg/dL (ref >39)
LDL Chol Calc (NIH): 92 mg/dL (ref 0–99)
Triglycerides: 143 mg/dL (ref 0–149)
VLDL Cholesterol Cal: 25 mg/dL (ref 5–40)

## 2019-04-30 ENCOUNTER — Other Ambulatory Visit: Payer: Self-pay | Admitting: Family Medicine

## 2019-05-22 ENCOUNTER — Other Ambulatory Visit: Payer: Self-pay | Admitting: Family Medicine

## 2019-06-08 ENCOUNTER — Telehealth: Payer: Self-pay | Admitting: Pulmonary Disease

## 2019-06-08 NOTE — Telephone Encounter (Signed)
Let pt know I am working on getting her CT scheduled.  Pt verbalized understanding & stated nothing furhter needed at this time.

## 2019-06-21 ENCOUNTER — Other Ambulatory Visit: Payer: Self-pay

## 2019-06-21 ENCOUNTER — Other Ambulatory Visit: Payer: Self-pay | Admitting: Pulmonary Disease

## 2019-06-21 MED ORDER — PAROXETINE HCL 40 MG PO TABS: 40.0000 mg | ORAL_TABLET | Freq: Every day | ORAL | 0 refills | Status: DC

## 2019-06-27 ENCOUNTER — Other Ambulatory Visit: Payer: Self-pay

## 2019-06-27 ENCOUNTER — Ambulatory Visit (INDEPENDENT_AMBULATORY_CARE_PROVIDER_SITE_OTHER)
Admission: RE | Admit: 2019-06-27 | Discharge: 2019-06-27 | Disposition: A | Discharge status: Home / Self Care | Payer: Managed Care, Other (non HMO) | Source: Ambulatory Visit | Attending: Pulmonary Disease | Admitting: Pulmonary Disease

## 2019-06-27 ENCOUNTER — Inpatient Hospital Stay: Admission: RE | Admit: 2019-06-27 | Payer: 59 | Source: Ambulatory Visit

## 2019-06-27 DIAGNOSIS — R918 Other nonspecific abnormal finding of lung field: Secondary | ICD-10-CM

## 2019-07-04 ENCOUNTER — Ambulatory Visit: Payer: 59 | Admitting: Pulmonary Disease

## 2019-07-05 ENCOUNTER — Ambulatory Visit: Payer: 59 | Admitting: Pulmonary Disease

## 2019-07-05 ENCOUNTER — Encounter: Payer: Self-pay | Admitting: Pulmonary Disease

## 2019-07-05 ENCOUNTER — Other Ambulatory Visit: Payer: Self-pay

## 2019-07-05 VITALS — BP 144/84 | HR 71 | Temp 97.2°F | Ht 63.0 in | Wt 203.6 lb

## 2019-07-05 DIAGNOSIS — R918 Other nonspecific abnormal finding of lung field: Secondary | ICD-10-CM

## 2019-07-05 DIAGNOSIS — J439 Emphysema, unspecified: Secondary | ICD-10-CM

## 2019-07-05 DIAGNOSIS — Z23 Encounter for immunization: Secondary | ICD-10-CM | POA: Diagnosis not present

## 2019-07-05 DIAGNOSIS — J410 Simple chronic bronchitis: Secondary | ICD-10-CM

## 2019-07-05 NOTE — Progress Notes (Signed)
Jamie Pennington    [id number]    Feb 15, 1956  Primary Care Physician:Chambliss, Estill Batten, MD  Referring Physician: Carney Living, MD [address] [place],  [place] [address]  Chief complaint:   Patient with a history of shortness of breath with exertion, has history of COPD  HPI:   No significant complaints today Shortness of breath with exertion She does have a chronic nonproductive cough  History of COPD Currently on Symbicort, Spiriva, albuterol as needed Quit smoking in 1999 Denies any chest pains or chest discomfort Occasionally has a cough, not really bringing up any secretions at present  Shortness of breath with mild to moderate exertion  No pertinent occupational history  Outpatient Encounter Medications as of 07/05/2019  Medication Sig  . acetaminophen (TYLENOL) 500 MG tablet Take 1,000 mg by mouth every 6 (six) hours as needed for headache.  . Albuterol Sulfate (VENTOLIN HFA IN) Inhale 2 puffs into the lungs as needed (Wheezing).  Marland Kitchen amLODipine (NORVASC) 5 MG tablet TAKE 1 TABLET BY MOUTH EVERY DAY  . amoxicillin-clavulanate (AUGMENTIN) 875-125 MG tablet Take 1 tablet by mouth 2 (two) times daily.  Marland Kitchen aspirin 81 MG tablet Take 81 mg by mouth daily. Reported on 11/27/2015  . atorvastatin (LIPITOR) 40 MG tablet TAKE 1 TABLET BY MOUTH EVERY DAY  . benzonatate (TESSALON) 100 MG capsule TAKE 1 CAPSULE(100 MG) BY MOUTH THREE TIMES DAILY AS NEEDED FOR COUGH  . budesonide-formoterol (SYMBICORT) 160-4.5 MCG/ACT inhaler Inhale 2 puffs into the lungs 2 (two) times daily.  . calcium-vitamin D (OSCAL WITH D 500-200) 500-200 MG-UNIT per tablet Take 1 tablet by mouth 2 (two) times daily.    . DULERA 200-5 MCG/ACT AERO INHALE 1 PUFF INTO THE LUNGS TWICE DAILY  . ezetimibe (ZETIA) 10 MG tablet Take 1 tablet (10 mg total) by mouth daily.  . famotidine (PEPCID) 40 MG tablet TAKE 1 TABLET(40 MG) BY MOUTH DAILY  . losartan (COZAAR) 50 MG tablet Take 1 tablet (50  mg total) by mouth daily.  . metoprolol tartrate (LOPRESSOR) 25 MG tablet TAKE 1 TABLET BY MOUTH TWICE DAILY  . montelukast (SINGULAIR) 10 MG tablet Take 1 tablet (10 mg total) by mouth at bedtime.  . Multiple Vitamin (MULTIVITAMIN) tablet Take 1 tablet by mouth daily.    . nitroGLYCERIN (NITROSTAT) 0.4 MG SL tablet Place 0.4 mg under the tongue every 5 (five) minutes as needed for chest pain (MAX 3 TABLETS).  Marland Kitchen omeprazole (PRILOSEC) 40 MG capsule TAKE 1 CAPSULE BY MOUTH EVERY MORNING FOR REFLUX  . pantoprazole (PROTONIX) 20 MG tablet Take 1 tablet (20 mg total) by mouth daily.  Marland Kitchen PARoxetine (PAXIL) 40 MG tablet Take 1 tablet (40 mg total) by mouth daily.  Marland Kitchen SPIRIVA RESPIMAT 2.5 MCG/ACT AERS INHALE 2 PUFFS INTO THE LUNGS DAILY   Facility-Administered Encounter Medications as of 07/05/2019  Medication  . diclofenac sodium (VOLTAREN) 1 % transdermal gel 2 g    Allergies as of 07/05/2019  . (No Known Allergies)    Past Medical History:  Diagnosis Date  . COPD (chronic obstructive pulmonary disease) (HCC)   . Coronary artery disease   . GERD (gastroesophageal reflux disease)   . Headache    "probably weekly" (02/24/2018)  . Hepatitis 1972   "don't remember what kind" (02/24/2018)  . Hyperlipidemia   . Hypertension   . Myocardial infarct [facility]) 2004   s/p stent placement   . Pneumonia    "once" (02/24/2018)  Past Surgical History:  Procedure Laterality Date  . AUGMENTATION MAMMAPLASTY Bilateral ~ 2008   implants  . BREAST BIOPSY Right    "negative"  . CARDIAC CATHETERIZATION N/A 11/28/2014   Procedure: Left Heart Cath and Coronary Angiography;  Surgeon: Lennette Bihari, MD;  Location: [facility] INVASIVE CV LAB;  Service: Cardiovascular;  Laterality: N/A;  . CORONARY ANGIOPLASTY WITH STENT PLACEMENT  2004   with stent  placement to the right coronary.  Elizebeth Brooking Osteotomy with Bone Graft Right 11/24/2012   @ [facility]  . KNEE ARTHROSCOPY Left 2010  . Plantar Endoscopic Fasciotomy Right  11/24/2012   @ [facility]    Family History  Problem Relation Age of Onset  . Stroke Father 4       died  . Alzheimer's disease Mother 105       died  . Heart attack Sister        and bypass surgery    Social History   Socioeconomic History  . Marital status: Divorced    Spouse name: Not on file  . Number of children: Not on file  . Years of education: Not on file  . Highest education level: Not on file  Occupational History  . Not on file  Tobacco Use  . Smoking status: Former Smoker    Packs/day: 1.50    Years: 20.00    Pack years: 30.00    Start date: 07/24/1971    Quit date: 01/18/1998    Years since quitting: 21.4  . Smokeless tobacco: Never Used  Substance and Sexual Activity  . Alcohol use: Not Currently    Comment: 02/24/2018 "just in my teens"  . Drug use: Never  . Sexual activity: Not Currently  Other Topics Concern  . Not on file  Social History Narrative  . Not on file   Social Determinants of Health   Financial Resource Strain:   . Difficulty of Paying Living Expenses: Not on file  Food Insecurity:   . Worried About Programme researcher, broadcasting/film/video in the Last Year: Not on file  . Ran Out of Food in the Last Year: Not on file  Transportation Needs:   . Lack of Transportation (Medical): Not on file  . Lack of Transportation (Non-Medical): Not on file  Physical Activity:   . Days of Exercise per Week: Not on file  . Minutes of Exercise per Session: Not on file  Stress:   . Feeling of Stress : Not on file  Social Connections:   . Frequency of Communication with Friends and Family: Not on file  . Frequency of Social Gatherings with Friends and Family: Not on file  . Attends Religious Services: Not on file  . Active Member of Clubs or Organizations: Not on file  . Attends Banker Meetings: Not on file  . Marital Status: Not on file  Intimate Partner Violence:   . Fear of Current or Ex-Partner: Not on file  . Emotionally Abused: Not on file  .  Physically Abused: Not on file  . Sexually Abused: Not on file    Review of Systems  Constitutional: Negative.   HENT: Negative.   Eyes: Negative.   Respiratory: Negative.   Cardiovascular: Negative.   Gastrointestinal: Negative.   Endocrine: Negative.     Vitals:   07/05/19 1217  BP: (!) 144/84  Pulse: 71  Temp: (!) 97.2 F (36.2 C)  SpO2: 94%     Physical Exam  Constitutional: She appears well-developed and well-nourished.  HENT:  Head: Normocephalic and atraumatic.  Eyes: Pupils are equal, round, and reactive to light. Conjunctivae are normal. Right eye exhibits no discharge.  Neck: No tracheal deviation present. No thyromegaly present.  Cardiovascular: Normal rate and regular rhythm.  Pulmonary/Chest: Effort normal and breath sounds normal. No respiratory distress. She has no wheezes. She has no rales.  Musculoskeletal:     Cervical back: Normal range of motion.   Data Reviewed: Chest x-ray 02/25/2018 shows no acute infiltrate CT scan significant for emphysema and stable lung nodule  Assessment:  History of COPD/emphysema -Continue bronchodilators -Continue rescue inhaler use as needed  Stable lung nodule-needs follow-up -Follow-up in a year  Reformed smoker  Shortness of breath on activity -Graded exercises will help  Plan/Recommendations: Continue use of Spiriva, Symbicort Albuterol as needed  Repeat CT in a year  Encouraged to continue exercising on a regular basis  Pneumovax shot today 23 valent   Sherrilyn Rist MD [facility] 07/05/2019, 12:21 PM  CC: Lind Covert, *

## 2019-07-05 NOTE — Patient Instructions (Signed)
COPD with stable symptoms  Pneumovax vaccine today  Continue your inhalers on a regular basis  Graded exercises as he can tolerate  I will see you back in 6 months  We will repeat your CT scan in a year from now  Call with any significant concerns

## 2019-07-07 ENCOUNTER — Other Ambulatory Visit: Payer: Self-pay

## 2019-07-07 ENCOUNTER — Ambulatory Visit: Payer: 59 | Admitting: Internal Medicine

## 2019-07-07 VITALS — BP 144/80 | HR 78 | Ht 63.0 in | Wt 204.2 lb

## 2019-07-07 DIAGNOSIS — I2510 Atherosclerotic heart disease of native coronary artery without angina pectoris: Secondary | ICD-10-CM

## 2019-07-07 DIAGNOSIS — E785 Hyperlipidemia, unspecified: Secondary | ICD-10-CM

## 2019-07-07 DIAGNOSIS — I10 Essential (primary) hypertension: Secondary | ICD-10-CM

## 2019-07-07 MED ORDER — LOSARTAN POTASSIUM-HCTZ 100-25 MG PO TABS: 0.5000 | ORAL_TABLET | Freq: Every day | ORAL | 3 refills | Status: DC

## 2019-07-07 NOTE — Progress Notes (Signed)
Cardiology Office Note   Date:  07/07/2019   ID:  Jamie Pennington, DOB March 25, 1956, MRN [id number]  PCP:  Lind Covert, MD  Cardiologist:   Dorris Carnes, MD    F/U of CAD    History of Present Illness: Jamie Pennington is a 64 y.o. female with a history of CAD  I saw her in Feb 2017  S/p IWMI with stent to RCA in 2004  Residual CAD to LCx  Cath in 2016 minal CAD  Patent stent  LVEF 55 to 60^  Also a history of HTN I saw her as a televisit in June 2020  Since seen she denies CP  Breathing is OK        Current Meds  Medication Sig  . acetaminophen (TYLENOL) 500 MG tablet Take 1,000 mg by mouth every 6 (six) hours as needed for headache.  . Albuterol Sulfate (VENTOLIN HFA IN) Inhale 2 puffs into the lungs as needed (Wheezing).  Marland Kitchen amLODipine (NORVASC) 5 MG tablet TAKE 1 TABLET BY MOUTH EVERY DAY  . amoxicillin-clavulanate (AUGMENTIN) 875-125 MG tablet Take 1 tablet by mouth 2 (two) times daily.  Marland Kitchen aspirin 81 MG tablet Take 81 mg by mouth daily. Reported on 11/27/2015  . atorvastatin (LIPITOR) 40 MG tablet TAKE 1 TABLET BY MOUTH EVERY DAY  . benzonatate (TESSALON) 100 MG capsule TAKE 1 CAPSULE(100 MG) BY MOUTH THREE TIMES DAILY AS NEEDED FOR COUGH  . budesonide-formoterol (SYMBICORT) 160-4.5 MCG/ACT inhaler Inhale 2 puffs into the lungs 2 (two) times daily.  . calcium-vitamin D (OSCAL WITH D 500-200) 500-200 MG-UNIT per tablet Take 1 tablet by mouth 2 (two) times daily.    . DULERA 200-5 MCG/ACT AERO INHALE 1 PUFF INTO THE LUNGS TWICE DAILY  . ezetimibe (ZETIA) 10 MG tablet Take 1 tablet (10 mg total) by mouth daily.  . famotidine (PEPCID) 40 MG tablet TAKE 1 TABLET(40 MG) BY MOUTH DAILY  . losartan (COZAAR) 50 MG tablet Take 1 tablet (50 mg total) by mouth daily.  . metoprolol tartrate (LOPRESSOR) 25 MG tablet TAKE 1 TABLET BY MOUTH TWICE DAILY  . montelukast (SINGULAIR) 10 MG tablet Take 1 tablet (10 mg total) by mouth at bedtime.  . Multiple Vitamin (MULTIVITAMIN) tablet Take 1  tablet by mouth daily.    . nitroGLYCERIN (NITROSTAT) 0.4 MG SL tablet Place 0.4 mg under the tongue every 5 (five) minutes as needed for chest pain (MAX 3 TABLETS).  Marland Kitchen omeprazole (PRILOSEC) 40 MG capsule TAKE 1 CAPSULE BY MOUTH EVERY MORNING FOR REFLUX  . pantoprazole (PROTONIX) 20 MG tablet Take 1 tablet (20 mg total) by mouth daily.  Marland Kitchen PARoxetine (PAXIL) 40 MG tablet Take 1 tablet (40 mg total) by mouth daily.  Marland Kitchen SPIRIVA RESPIMAT 2.5 MCG/ACT AERS INHALE 2 PUFFS INTO THE LUNGS DAILY   Current Facility-Administered Medications for the 07/07/19 encounter (Office Visit) with Fay Records, MD  Medication  . diclofenac sodium (VOLTAREN) 1 % transdermal gel 2 g     Allergies:   Patient has no known allergies.   Past Medical History:  Diagnosis Date  . COPD (chronic obstructive pulmonary disease) ([place])   . Coronary artery disease   . GERD (gastroesophageal reflux disease)   . Headache    "probably weekly" (02/24/2018)  . Hepatitis 1972   "don't remember what kind" (02/24/2018)  . Hyperlipidemia   . Hypertension   . Myocardial infarct [facility]) 2004   s/p stent placement   . Pneumonia    "once" (02/24/2018)  Past Surgical History:  Procedure Laterality Date  . AUGMENTATION MAMMAPLASTY Bilateral ~ 2008   implants  . BREAST BIOPSY Right    "negative"  . CARDIAC CATHETERIZATION N/A 11/28/2014   Procedure: Left Heart Cath and Coronary Angiography;  Surgeon: Lennette Bihari, MD;  Location: [facility] INVASIVE CV LAB;  Service: Cardiovascular;  Laterality: N/A;  . CORONARY ANGIOPLASTY WITH STENT PLACEMENT  2004   with stent  placement to the right coronary.  Elizebeth Brooking Osteotomy with Bone Graft Right 11/24/2012   @ [facility]  . KNEE ARTHROSCOPY Left 2010  . Plantar Endoscopic Fasciotomy Right 11/24/2012   @ [facility]     Social History:  The patient  reports that she quit smoking about 21 years ago. She started smoking about 47 years ago. She has a 30.00 pack-year smoking history. She has never used  smokeless tobacco. She reports previous alcohol use. She reports that she does not use drugs.   Family History:  The patient's family history includes Alzheimer's disease (age of onset: 43) in her mother; Heart attack in her sister; Stroke (age of onset: 34) in her father.    ROS:  Please see the history of present illness. All other systems are reviewed and  Negative to the above problem except as noted.    PHYSICAL EXAM: VS:  BP (!) 144/80   Pulse 78   Ht 5\' 3"  (1.6 m)   Wt 204 lb 3.2 oz (92.6 kg)   BMI 36.17 kg/m   GEN: Well nourished, well developed, in no acute distress  HEENT: normal  Neck: no JVD, or bruits   Cardiac: RRR; no murmurs, rubs, or gallops,no LE edema  Respiratory:  clear to auscultation bilaterally, normal work of breathing GI: soft, nontender, nondistended, + BS  No hepatomegaly  MS: no deformity Moving all extremities   Skin: warm and dry, no rash Neuro:  Strength and sensation are intact Psych: euthymic mood, full affect   EKG:  EKG is ordered today.  SB 78 bpm     Lipid Panel    Component Value Date/Time   CHOL 166 03/20/2019 1506   TRIG 143 03/20/2019 1506   HDL 49 03/20/2019 1506   CHOLHDL 3.4 03/20/2019 1506   CHOLHDL 3.6 06/20/2015 0849   VLDL 24 06/20/2015 0849   LDLCALC 92 03/20/2019 1506   LDLDIRECT 99 08/02/2012 0917      Wt Readings from Last 3 Encounters:  07/07/19 204 lb 3.2 oz (92.6 kg)  07/05/19 203 lb 9.6 oz (92.4 kg)  01/03/19 196 lb (88.9 kg)      ASSESSMENT AND PLAN:  1  CAD  NO symptoms to sugg angina  2  HTN  BP is mildly increased   She says that it is near this at home   I would switch losartan to hyzaar 50/12.5    F/U in HTN clnic   3   HL  LDL 92  HDL 49   Pt on statin and zetia.   She admits her diet is not optimal but not sure it will change significantly with diet With CAD will refer to lipid clinic for consideration of Repath.  F/U in lipid/ HTN clinic this spring;  Follow up with me in 9 months      Current medicines are reviewed at length with the patient today.  The patient does not have concerns regarding medicines.  Signed, 11-30-1990, MD  07/07/2019 3:43 PM    [facility] [address]  [address], [place], [place]  [address] Phone: [phone]; Fax: [phone]

## 2019-07-07 NOTE — Patient Instructions (Signed)
Medication Instructions:  Your physician has recommended you make the following change in your medication:  1.) stop cozaar 2.) start hyzaar 100/25 mg --take HALF TABLET by mouth daily  *If you need a refill on your cardiac medications before your next appointment, please call your pharmacy*  Lab Work: None  Testing/Procedures: none  Follow-Up: At [facility], you and your health needs are our priority.  As part of our continuing mission to provide you with exceptional heart care, we have created designated Provider Care Teams.  These Care Teams include your primary Cardiologist (physician) and Advanced Practice Providers (APPs -  Physician Assistants and Nurse Practitioners) who all work together to provide you with the care you need, when you need it.   Your next appointment:   9 month(s)  The format for your next appointment:   In Person  Provider:   Dietrich Pates, MD   Other Instructions You have been referred to lipid clinic to discuss Repatha.  Please also schedule an appointment with the pharmacist for blood pressure with pharmacist in 4 weeks.

## 2019-07-26 ENCOUNTER — Encounter: Payer: Managed Care, Other (non HMO) | Admitting: Family Medicine

## 2019-08-01 ENCOUNTER — Other Ambulatory Visit: Payer: Self-pay | Admitting: Primary Care

## 2019-08-03 ENCOUNTER — Ambulatory Visit: Payer: 59

## 2019-08-21 ENCOUNTER — Other Ambulatory Visit: Payer: Self-pay | Admitting: Pulmonary Disease

## 2019-08-21 ENCOUNTER — Other Ambulatory Visit: Payer: Self-pay | Admitting: *Deleted

## 2019-08-21 MED ORDER — FAMOTIDINE 40 MG PO TABS: ORAL_TABLET | ORAL | 1 refills | Status: DC

## 2019-08-31 ENCOUNTER — Ambulatory Visit: Payer: 59

## 2019-08-31 ENCOUNTER — Encounter: Payer: Self-pay | Admitting: General Practice

## 2019-09-20 ENCOUNTER — Other Ambulatory Visit: Payer: Self-pay

## 2019-09-20 MED ORDER — PAROXETINE HCL 40 MG PO TABS: 40.0000 mg | ORAL_TABLET | Freq: Every day | ORAL | 0 refills | Status: DC

## 2019-10-10 ENCOUNTER — Ambulatory Visit (INDEPENDENT_AMBULATORY_CARE_PROVIDER_SITE_OTHER): Payer: 59 | Admitting: Family Medicine

## 2019-10-10 ENCOUNTER — Encounter: Payer: Self-pay | Admitting: Family Medicine

## 2019-10-10 ENCOUNTER — Other Ambulatory Visit: Payer: Self-pay

## 2019-10-10 DIAGNOSIS — R5383 Other fatigue: Secondary | ICD-10-CM

## 2019-10-10 DIAGNOSIS — E785 Hyperlipidemia, unspecified: Secondary | ICD-10-CM

## 2019-10-10 DIAGNOSIS — F321 Major depressive disorder, single episode, moderate: Secondary | ICD-10-CM | POA: Diagnosis not present

## 2019-10-10 NOTE — Patient Instructions (Addendum)
Good to see you today!  Thanks for coming in.  I will call you if your tests are not good.  Otherwise I will send you a letter.  If you do not hear from me with in 2 weeks please call our office.     Contact your pulmonologist about your shortness of breath and possibility of pulmonary rehab and possibly a sleep study  Consider depression and whether you would like to see a counselor and or restart medication  Come back in one month - Please bring all your medications

## 2019-10-10 NOTE — Assessment & Plan Note (Signed)
Many possibilities.   Will start with asking her to see her pulmonologist to see if can improve lungs and perhaps assess for sleep apnea and pulmonary rehab .  Will check labs.  May need to investigate depression further

## 2019-10-10 NOTE — Progress Notes (Signed)
    SUBJECTIVE:   CHIEF COMPLAINT / HPI:   FATIGUE Complains of fatigue for months.  Worsening since covid.  Feels very tired and has shortness of breath when walking. Also concerned about going out and catching covid.  Has not exercised for months.  Stopped Paxil about a year ago.  No chest pain with exertion, minimal edema.  Using all her inhaler but did not bring in.  No fevers or weight loss (has gained) Recently seen by cardiology and blood pressure medication changed.     SLEEPY More sleepy than usual and falls asleep frequently.  Thinks does snore more.  Unsure if has apnea.   Never had sleep study   PERTINENT  PMH / PSH: COPD, CAD, used to work full time now a few half days a week   OBJECTIVE:   BP 138/72   Pulse 64   Wt 201 lb (91.2 kg)   SpO2 96%   BMI 35.61 kg/m   Heart - Regular rate and rhythm.  No murmurs, gallops or rubs.    Lung - decreased sounds at bases.  No crackles or wheezes Neck:  No deformities, thyromegaly, masses, or tenderness noted.   Supple with full range of motion without pain. Trace edema at ankle   ASSESSMENT/PLAN:   Depression No longer taking SSRI.  No suicidal ideation mainly fatigue and anxiety.  Will assess for other causes of fatigue.  She will think about possible further treatments   Fatigue Many possibilities.   Will start with asking her to see her pulmonologist to see if can improve lungs and perhaps assess for sleep apnea and pulmonary rehab .  Will check labs.  May need to investigate depression further    Physical - discussed weight gain and perhaps resuming exercise with pulmonary rehab   Carney Living, MD [facility]

## 2019-10-10 NOTE — Assessment & Plan Note (Signed)
No longer taking SSRI.  No suicidal ideation mainly fatigue and anxiety.  Will assess for other causes of fatigue.  She will think about possible further treatments

## 2019-10-11 ENCOUNTER — Encounter: Payer: Self-pay | Admitting: Family Medicine

## 2019-10-11 LAB — CBC
Hematocrit: 44.9 % (ref 34.0–46.6)
Hemoglobin: 14.1 g/dL (ref 11.1–15.9)
MCH: 26 pg — ABNORMAL LOW (ref 26.6–33.0)
MCHC: 31.4 g/dL — ABNORMAL LOW (ref 31.5–35.7)
MCV: 83 fL (ref 79–97)
Platelets: 392 10*3/uL (ref 150–450)
RBC: 5.42 x10E6/uL — ABNORMAL HIGH (ref 3.77–5.28)
RDW: 14.5 % (ref 11.7–15.4)
WBC: 6.2 10*3/uL (ref 3.4–10.8)

## 2019-10-11 LAB — CMP14+EGFR
ALT: 22 IU/L (ref 0–32)
AST: 20 IU/L (ref 0–40)
Albumin/Globulin Ratio: 1.5 (ref 1.2–2.2)
Albumin: 3.9 g/dL (ref 3.8–4.8)
Alkaline Phosphatase: 111 IU/L (ref 48–121)
BUN/Creatinine Ratio: 21 (ref 12–28)
BUN: 17 mg/dL (ref 8–27)
Bilirubin Total: 0.4 mg/dL (ref 0.0–1.2)
CO2: 22 mmol/L (ref 20–29)
Calcium: 8.9 mg/dL (ref 8.7–10.3)
Chloride: 100 mmol/L (ref 96–106)
Creatinine, Ser: 0.8 mg/dL (ref 0.57–1.00)
GFR calc Af Amer: 89 mL/min/{1.73_m2} (ref >59)
GFR calc non Af Amer: 78 mL/min/{1.73_m2} (ref >59)
Globulin, Total: 2.6 g/dL (ref 1.5–4.5)
Glucose: 107 mg/dL — ABNORMAL HIGH (ref 65–99)
Potassium: 4.4 mmol/L (ref 3.5–5.2)
Sodium: 138 mmol/L (ref 134–144)
Total Protein: 6.5 g/dL (ref 6.0–8.5)

## 2019-10-11 LAB — LIPID PANEL
Chol/HDL Ratio: 3.4 ratio (ref 0.0–4.4)
Cholesterol, Total: 152 mg/dL (ref 100–199)
HDL: 45 mg/dL (ref >39)
LDL Chol Calc (NIH): 82 mg/dL (ref 0–99)
Triglycerides: 142 mg/dL (ref 0–149)
VLDL Cholesterol Cal: 25 mg/dL (ref 5–40)

## 2019-10-11 LAB — TSH: TSH: 2.4 u[IU]/mL (ref 0.450–4.500)

## 2019-10-17 ENCOUNTER — Other Ambulatory Visit: Payer: Self-pay | Admitting: Internal Medicine

## 2019-10-17 ENCOUNTER — Other Ambulatory Visit: Payer: Self-pay | Admitting: Cardiology

## 2019-10-18 ENCOUNTER — Other Ambulatory Visit: Payer: Self-pay | Admitting: Nurse Practitioner

## 2019-11-16 ENCOUNTER — Other Ambulatory Visit: Payer: Self-pay | Admitting: Internal Medicine

## 2019-12-18 ENCOUNTER — Encounter: Payer: Self-pay | Admitting: Family Medicine

## 2019-12-18 ENCOUNTER — Telehealth: Payer: Self-pay | Admitting: *Deleted

## 2019-12-18 MED ORDER — PAROXETINE HCL 20 MG PO TABS: 20.0000 mg | ORAL_TABLET | Freq: Every day | ORAL | 0 refills | Status: DC

## 2019-12-18 NOTE — Telephone Encounter (Signed)
Rx request for paroxetine 40mg  tablets. Please advise. Cameron Katayama , CMA

## 2020-01-01 ENCOUNTER — Encounter: Payer: Self-pay | Admitting: Family Medicine

## 2020-01-17 ENCOUNTER — Other Ambulatory Visit: Payer: Self-pay | Admitting: Family Medicine

## 2020-01-18 ENCOUNTER — Encounter: Payer: Self-pay | Admitting: Family Medicine

## 2020-01-18 MED ORDER — SPIRIVA RESPIMAT 2.5 MCG/ACT IN AERS: 2.0000 | INHALATION_SPRAY | Freq: Every day | RESPIRATORY_TRACT | 5 refills | Status: DC

## 2020-01-18 MED ORDER — BUDESONIDE-FORMOTEROL FUMARATE 160-4.5 MCG/ACT IN AERO: 2.0000 | INHALATION_SPRAY | Freq: Two times a day (BID) | RESPIRATORY_TRACT | 5 refills | Status: DC

## 2020-01-18 MED ORDER — PAROXETINE HCL 20 MG PO TABS: 20.0000 mg | ORAL_TABLET | Freq: Every day | ORAL | 1 refills | Status: DC

## 2020-01-23 ENCOUNTER — Ambulatory Visit (INDEPENDENT_AMBULATORY_CARE_PROVIDER_SITE_OTHER): Payer: 59

## 2020-01-23 ENCOUNTER — Ambulatory Visit: Payer: 59 | Admitting: Primary Care

## 2020-01-23 ENCOUNTER — Encounter: Payer: Self-pay | Admitting: Primary Care

## 2020-01-23 ENCOUNTER — Other Ambulatory Visit: Payer: Self-pay

## 2020-01-23 VITALS — BP 140/80 | HR 68 | Temp 97.3°F | Ht 63.0 in | Wt 200.2 lb

## 2020-01-23 DIAGNOSIS — J410 Simple chronic bronchitis: Secondary | ICD-10-CM

## 2020-01-23 DIAGNOSIS — J441 Chronic obstructive pulmonary disease with (acute) exacerbation: Secondary | ICD-10-CM | POA: Diagnosis not present

## 2020-01-23 MED ORDER — PREDNISONE 10 MG PO TABS: ORAL_TABLET | ORAL | 0 refills | Status: DC

## 2020-01-23 NOTE — Patient Instructions (Addendum)
Nice seeing you today Jamie Pennington  Recommendations: - Continue Symbicort 160 two puff twice daily - Continue Spiriva Respimat  - Continue Singulair 10mg  at bedtime  - Add Flonase nasal spray once daily (over the counter) - Resume ocean nasal spray (over the counter)  Orders: - CXR today  RX: - Prednisone taper - Holding off on abx until CXR results are back   Follow-up: - 6 months or sooner if symptom do not improve

## 2020-01-23 NOTE — Progress Notes (Signed)
@Patient  ID: , female    DOB: 1956/01/15, 64 y.o.   MRN: [id number]  Chief Complaint  Patient presents with  . Follow-up    Pt states she feels tired all the time. States she does sleep and feels like she may be sleeping too much. Pt states she has had a flare up with her allergies and has complaints of a dry cough, wheezing, and mild SOB.    Referring provider: [id number], *   HPI: 64 year old female, former smoker quit 1999. PMH significant for COPD GOLD 2, CAD, HTN. Patient of Dr. 2000, seen for initial consult on 05/25/18 for shortness of breath with exertion. Maintained on Symbicort, Spiriva, singulair Added Singulair.   Previous [facility] pulmonary encounters: 01/03/2019- NP Patient presents today for acute visit with complaints of allergies, sinus congestion and HA x 2 weeks. Associated chest congestion and shortness of breath. Wakes up every morning with a headache. She has been taking tylenol and Claritin for the last two weeks ago. Using ocean nasal spray. She is taking is taking Spiriva and Symbicort ONCE daily. She has been taking Singulair. Denies fever, sick contact, loss of smell/taste.   07/05/19- Dr. 07/07/19 No significant complaints today Shortness of breath with exertion She does have a chronic nonproductive cough  History of COPD Currently on Symbicort, Spiriva, albuterol as needed Quit smoking in 1999 Denies any chest pains or chest discomfort Occasionally has a cough, not really bringing up any secretions at present  Shortness of breath with mild to moderate exertion  No pertinent occupational history  01/23/2020- Interim hx Patient presents today for 6 month follow-up for COPD. She reports feeling very tired. She developed hacking NP cough 2 weeks ago with associated sinusitis symptoms starting several weeks before that. Reports her allergies are currently bad. She is compliment with Symbicort 160 two puffs twice dail. She has been needing  to use her albuterol rescue inhaler more recently. She is taking Singulair 10mg  at bedtime. She rotates between Claritin and Zyertec. She has not been using any nasal spray. No significant shortness of breath. She gets winded when hurrying or taking a shower. She has received both covid vaccines.   Tests: CT chest 06/14/18 - Moderate centrilobular emphysema. Multiple small pulmonary nodules measuring 4 mm or smaller. CT follow-up is optional at 12 months for these nodules in the setting of high risk factors for lung cancer. Consider ongoing annual low-dose CT lung cancer screening indicated by patient age, smoking history, or other risk factors for lung cancer. Coronary artery disease.   No Known Allergies  Immunization History  Administered Date(s) Administered  . Influenza Split 02/11/2011  . Influenza Whole 02/21/2007  . Influenza,inj,Quad PF,6+ Mos 05/28/2015, 02/12/2016, 06/16/2017, 03/04/2018, 03/20/2019  . Influenza-Unspecified 04/10/2014  . Pneumococcal Conjugate-13 05/28/2015  . Pneumococcal Polysaccharide-23 07/05/2019  . Td 02/08/1997    Past Medical History:  Diagnosis Date  . COPD (chronic obstructive pulmonary disease) (HCC)   . Coronary artery disease   . GERD (gastroesophageal reflux disease)   . Headache    "probably weekly" (02/24/2018)  . Hepatitis 1972   "don't remember what kind" (02/24/2018)  . Hyperlipidemia   . Hypertension   . Myocardial infarct [facility]) 2004   s/p stent placement   . Pneumonia    "once" (02/24/2018)    Tobacco History: Social History   Tobacco Use  Smoking Status Former Smoker  . Packs/day: 1.50  . Years: 20.00  . Pack years: 30.00  . Start  date: 07/24/1971  . Quit date: 01/18/1998  . Years since quitting: 22.0  Smokeless Tobacco Never Used   Counseling given: Not Answered   Outpatient Medications Prior to Visit  Medication Sig Dispense Refill  . acetaminophen (TYLENOL) 500 MG tablet Take 1,000 mg by mouth every 6 (six) hours  as needed for headache.    . Albuterol Sulfate (VENTOLIN HFA IN) Inhale 2 puffs into the lungs as needed (Wheezing).    Marland Kitchen amLODipine (NORVASC) 5 MG tablet TAKE 1 TABLET BY MOUTH EVERY DAY 90 tablet 2  . aspirin 81 MG tablet Take 81 mg by mouth daily. Reported on 11/27/2015    . atorvastatin (LIPITOR) 40 MG tablet TAKE 1 TABLET BY MOUTH EVERY DAY 90 tablet 2  . benzonatate (TESSALON) 100 MG capsule TAKE 1 CAPSULE(100 MG) BY MOUTH THREE TIMES DAILY AS NEEDED FOR COUGH 30 capsule 0  . budesonide-formoterol (SYMBICORT) 160-4.5 MCG/ACT inhaler Inhale 2 puffs into the lungs 2 (two) times daily. 10.2 g 5  . calcium-vitamin D (OSCAL WITH D 500-200) 500-200 MG-UNIT per tablet Take 1 tablet by mouth 2 (two) times daily.      . DULERA 200-5 MCG/ACT AERO INHALE 1 PUFF INTO THE LUNGS TWICE DAILY 17.6 g 6  . ezetimibe (ZETIA) 10 MG tablet TAKE 1 TABLET(10 MG) BY MOUTH DAILY 90 tablet 2  . famotidine (PEPCID) 40 MG tablet TAKE 1 TABLET(40 MG) BY MOUTH DAILY 90 tablet 1  . losartan-hydrochlorothiazide (HYZAAR) 100-25 MG tablet Take 0.5 tablets by mouth daily. 90 tablet 3  . metoprolol tartrate (LOPRESSOR) 25 MG tablet TAKE 1 TABLET BY MOUTH TWICE DAILY 180 tablet 2  . montelukast (SINGULAIR) 10 MG tablet TAKE 1 TABLET(10 MG) BY MOUTH AT BEDTIME 30 tablet 11  . Multiple Vitamin (MULTIVITAMIN) tablet Take 1 tablet by mouth daily.      . nitroGLYCERIN (NITROSTAT) 0.4 MG SL tablet Place 0.4 mg under the tongue every 5 (five) minutes as needed for chest pain (MAX 3 TABLETS).    Marland Kitchen omeprazole (PRILOSEC) 40 MG capsule TAKE 1 CAPSULE BY MOUTH EVERY MORNING FOR REFLUX 90 capsule 2  . pantoprazole (PROTONIX) 20 MG tablet Take 1 tablet (20 mg total) by mouth daily. 30 tablet 0  . PARoxetine (PAXIL) 20 MG tablet Take 1 tablet (20 mg total) by mouth daily. 90 tablet 1  . Tiotropium Bromide Monohydrate (SPIRIVA RESPIMAT) 2.5 MCG/ACT AERS Inhale 2 puffs into the lungs daily. 4 g 5   Facility-Administered Medications Prior to  Visit  Medication Dose Route Frequency Provider Last Rate Last Admin  . diclofenac sodium (VOLTAREN) 1 % transdermal gel 2 g  2 g Topical QID Chambliss, Estill Batten, MD        Review of Systems  Review of Systems  Constitutional: Positive for fatigue. Negative for fever.  Respiratory: Positive for cough and shortness of breath.    Physical Exam  BP 140/80 (BP Location: Right Arm, Cuff Size: Normal)   Pulse 68   Temp (!) 97.3 F (36.3 C) (Other (Comment)) Comment (Src): wrist  Ht 5\' 3"  (1.6 m)   Wt 200 lb 3.2 oz (90.8 kg)   SpO2 96%   BMI 35.46 kg/m  Physical Exam Constitutional:      Appearance: Normal appearance.  HENT:     Head: Normocephalic and atraumatic.     Mouth/Throat:     Comments: Deferred d/t masking Cardiovascular:     Rate and Rhythm: Normal rate and regular rhythm.  Pulmonary:     Effort:  Pulmonary effort is normal.     Breath sounds: Rhonchi present.  Musculoskeletal:        General: Normal range of motion.  Skin:    General: Skin is warm and dry.  Neurological:     General: No focal deficit present.     Mental Status: She is alert and oriented to person, place, and time. Mental status is at baseline.  Psychiatric:        Mood and Affect: Mood normal.        Behavior: Behavior normal.        Thought Content: Thought content normal.        Judgment: Judgment normal.      Lab Results:  CBC    Component Value Date/Time   WBC 6.2 10/10/2019 1337   WBC 19.4 (H) 02/26/2018 0220   RBC 5.42 (H) 10/10/2019 1337   RBC 4.29 02/26/2018 0220   HGB 14.1 10/10/2019 1337   HCT 44.9 10/10/2019 1337   PLT 392 10/10/2019 1337   MCV 83 10/10/2019 1337   MCH 26.0 (L) 10/10/2019 1337   MCH 31.9 02/26/2018 0220   MCHC 31.4 (L) 10/10/2019 1337   MCHC 34.3 02/26/2018 0220   RDW 14.5 10/10/2019 1337   LYMPHSABS 1.4 02/26/2018 0220   MONOABS 0.7 02/26/2018 0220   EOSABS 0.0 02/26/2018 0220   BASOSABS 0.0 02/26/2018 0220    BMET    Component Value  Date/Time   NA 138 10/10/2019 1337   K 4.4 10/10/2019 1337   CL 100 10/10/2019 1337   CO2 22 10/10/2019 1337   GLUCOSE 107 (H) 10/10/2019 1337   GLUCOSE 98 02/24/2018 1835   BUN 17 10/10/2019 1337   CREATININE 0.80 10/10/2019 1337   CREATININE 0.64 06/20/2015 0849   CALCIUM 8.9 10/10/2019 1337   GFRNONAA 78 10/10/2019 1337   GFRAA 89 10/10/2019 1337    BNP No results found for: BNP  ProBNP    Component Value Date/Time   PROBNP 106.0 (H) 11/06/2014 1449    Imaging: No results found.   Assessment & Plan:   COPD (chronic obstructive pulmonary disease) (HCC) - Acute sinobronchitis symptoms x2 weeks  - Continue Symbicort 160 two puff twice daily; Spiriva Respimat  - Continue Singulair 10mg  at bedtime  - Add Flonase nasal spray once daily (over the counter) - Resume ocean nasal spray (over the counter)  Orders: - CXR today  RX: - Prednisone taper (40mg  x 2 days; 20mg  x 2 days; 10mg  x 2 days) - Holding off on abx until CXR results are back   Follow-up: - 6 months or sooner if symptom do not improve     , NP 01/23/2020

## 2020-01-23 NOTE — Assessment & Plan Note (Signed)
-   Acute sinobronchitis symptoms x2 weeks  - Continue Symbicort 160 two puff twice daily; Spiriva Respimat  - Continue Singulair 10mg  at bedtime  - Add Flonase nasal spray once daily (over the counter) - Resume ocean nasal spray (over the counter)  Orders: - CXR today  RX: - Prednisone taper (40mg  x 2 days; 20mg  x 2 days; 10mg  x 2 days) - Holding off on abx until CXR results are back   Follow-up: - 6 months or sooner if symptom do not improve

## 2020-01-24 NOTE — Progress Notes (Signed)
CXR showed no active cardiopulmonary disease, emphysematous disease.

## 2020-02-14 ENCOUNTER — Other Ambulatory Visit: Payer: Self-pay | Admitting: Family Medicine

## 2020-02-15 ENCOUNTER — Other Ambulatory Visit: Payer: Self-pay | Admitting: Family Medicine

## 2020-03-05 ENCOUNTER — Ambulatory Visit (INDEPENDENT_AMBULATORY_CARE_PROVIDER_SITE_OTHER): Payer: 59

## 2020-03-05 ENCOUNTER — Other Ambulatory Visit: Payer: Self-pay

## 2020-03-05 DIAGNOSIS — Z23 Encounter for immunization: Secondary | ICD-10-CM

## 2020-03-05 NOTE — Progress Notes (Signed)
Patient presents in Flu Clinic for Flu Vaccine.   Vaccine administered LD without complication.   See admin for details.  

## 2020-03-27 ENCOUNTER — Encounter: Payer: Self-pay | Admitting: Family Medicine

## 2020-04-02 MED ORDER — OMEPRAZOLE 40 MG PO CPDR: DELAYED_RELEASE_CAPSULE | ORAL | 0 refills | Status: DC

## 2020-04-16 ENCOUNTER — Telehealth: Payer: Self-pay

## 2020-04-16 MED ORDER — OMEPRAZOLE 40 MG PO CPDR: DELAYED_RELEASE_CAPSULE | ORAL | 0 refills | Status: DC

## 2020-04-16 NOTE — Telephone Encounter (Signed)
Patient calls nurse line stating she never picked up Omeprazole ordered on 11/23 due to cost. Patient reports she shopped around and found [facility] has the best price. Patient requests we send to [facility] in [place].

## 2020-05-09 ENCOUNTER — Other Ambulatory Visit: Payer: Self-pay | Admitting: *Deleted

## 2020-05-10 MED ORDER — FAMOTIDINE 40 MG PO TABS: 40.0000 mg | ORAL_TABLET | Freq: Every day | ORAL | 0 refills | Status: DC

## 2020-06-08 ENCOUNTER — Other Ambulatory Visit: Payer: Self-pay | Admitting: Family Medicine

## 2020-07-01 ENCOUNTER — Other Ambulatory Visit: Payer: Self-pay | Admitting: *Deleted

## 2020-07-01 MED ORDER — OMEPRAZOLE 40 MG PO CPDR: 40.0000 mg | DELAYED_RELEASE_CAPSULE | Freq: Every day | ORAL | 0 refills | Status: DC

## 2020-07-02 ENCOUNTER — Other Ambulatory Visit: Payer: Self-pay | Admitting: Family Medicine

## 2020-07-02 ENCOUNTER — Other Ambulatory Visit: Payer: Self-pay | Admitting: Internal Medicine

## 2020-08-01 ENCOUNTER — Other Ambulatory Visit: Payer: Self-pay | Admitting: Internal Medicine

## 2020-08-01 ENCOUNTER — Other Ambulatory Visit: Payer: Self-pay | Admitting: Family Medicine

## 2020-08-02 MED ORDER — OMEPRAZOLE 40 MG PO CPDR: 40.0000 mg | DELAYED_RELEASE_CAPSULE | Freq: Every day | ORAL | 0 refills | Status: DC

## 2020-08-02 MED ORDER — PAROXETINE HCL 20 MG PO TABS: 20.0000 mg | ORAL_TABLET | Freq: Every day | ORAL | 0 refills | Status: DC

## 2020-08-21 ENCOUNTER — Ambulatory Visit (INDEPENDENT_AMBULATORY_CARE_PROVIDER_SITE_OTHER): Payer: 59 | Admitting: Family Medicine

## 2020-08-21 ENCOUNTER — Other Ambulatory Visit: Payer: Self-pay

## 2020-08-21 ENCOUNTER — Encounter: Payer: Self-pay | Admitting: Family Medicine

## 2020-08-21 DIAGNOSIS — K219 Gastro-esophageal reflux disease without esophagitis: Secondary | ICD-10-CM | POA: Diagnosis not present

## 2020-08-21 DIAGNOSIS — R5383 Other fatigue: Secondary | ICD-10-CM

## 2020-08-21 NOTE — Progress Notes (Signed)
    SUBJECTIVE:   CHIEF COMPLAINT / HPI:    GERD Taking famotidine and omperazole which controls symptoms but needs to take every day.  No bleeding or trouble swallowing  FATIGUE Mixture of feeling like she does not want to get out of bed and that she gets tired and shortness of breath when she walks.  No sudden chest pain or lightheadness just "gives out"  This has been stable over the last year or so.  Used to exercise regularly several years ago but does not any now.  Takes paxil regularly.  No significant feeling down or any suicidal ideation. Has not seen her cardiologist or pulmonolgist in about a year.   Plans to try to slowly increase exercise when is in [facility] for a month  PERTINENT  PMH / PSH: CAD COPD  OBJECTIVE:   BP 125/70   Pulse 69   Ht 5' 2.5" (1.588 m)   Wt 195 lb 12.8 oz (88.8 kg)   SpO2 92%   BMI 35.24 kg/m   Heart - Regular rate and rhythm.  No murmurs, gallops or rubs.    Lungs:  Normal respiratory effort, chest expands symmetrically. Lungs are clear to auscultation, no crackles or wheezes. 1 + edema both ankles Pleasant and conversant  ASSESSMENT/PLAN:   Fatigue Sounds to be combination of motivation and physical restrictions.  Will check labs and she will slowly increase her exercise.  See after visit summary.  To follow up with cardiology.  Consider cardiac/pulmonary rehab if not able to exercise and is not feeling better.    GERD (gastroesophageal reflux disease) Stable on PPI and H2 blocker.  Unable to wean due to symptoms.  No signs of cancer or stricture    HM Signed ROI for mamogram records from Dr Jennette Kettle Gyn  Carney Living, MD [facility]

## 2020-08-21 NOTE — Assessment & Plan Note (Signed)
Sounds to be combination of motivation and physical restrictions.  Will check labs and she will slowly increase her exercise.  See after visit summary.  To follow up with cardiology.  Consider cardiac/pulmonary rehab if not able to exercise and is not feeling better.

## 2020-08-21 NOTE — Assessment & Plan Note (Signed)
Stable on PPI and H2 blocker.  Unable to wean due to symptoms.  No signs of cancer or stricture

## 2020-08-21 NOTE — Patient Instructions (Signed)
Good to see you today - Thank you for coming in  Things we discussed today:  Hypertension You seem to not be on Losartan/HCTZ which is a blood pressure medication.  Ask Dr Tenny Craw if she thinks you should be on this  Also ask about Cardiac rehab if you are not exercising as much as you should   Try a very slow increase in your walking while at the beach. If you have significant chest pain or shortness of breath stop and let us know  Use support stockings as soon as your leave the bed in the AM and until home when can elevate the legs   Please always bring your medication bottles  Come back to see me in 6 months  Have fun in [facility]

## 2020-08-31 ENCOUNTER — Other Ambulatory Visit: Payer: Self-pay | Admitting: Family Medicine

## 2020-08-31 ENCOUNTER — Other Ambulatory Visit: Payer: Self-pay | Admitting: Internal Medicine

## 2020-09-02 ENCOUNTER — Other Ambulatory Visit: Payer: Self-pay

## 2020-09-04 ENCOUNTER — Other Ambulatory Visit: Payer: Self-pay | Admitting: Family Medicine

## 2020-09-04 MED ORDER — OMEPRAZOLE 40 MG PO CPDR: 40.0000 mg | DELAYED_RELEASE_CAPSULE | Freq: Every day | ORAL | 0 refills | Status: DC

## 2020-09-04 MED ORDER — OMEPRAZOLE 40 MG PO CPDR: 40.0000 mg | DELAYED_RELEASE_CAPSULE | Freq: Every day | ORAL | 3 refills | Status: DC

## 2020-09-25 NOTE — Progress Notes (Signed)
Cardiology Office Note    Date:  10/02/2020   ID:  Jamie Pennington, DOB 11/28/1955, MRN [id number]   PCP:  Carney Living, MD   [facility]  Cardiologist:  Dietrich Pates, MD  Advanced Practice Provider:  No care team member to display Electrophysiologist:  None   [id number]}   Chief Complaint  Patient presents with  . Follow-up    History of Present Illness:  Jamie Pennington is a 65 y.o. female with history of CAD status post MI 2004 treated with stent to the RCA with residual disease in the circumflex, last cath 2016 minimal CAD, patent stent LVEF 55 to 60%, hypertension  Patient last saw Dr. Tenny Craw 06/2019 at which time her blood pressure was up and she was switched from losartan to Hyzaar 50/12.5 mg and supposed to follow-up in our hypertension clinic but this was not done.  Patient comes in for f/u. Having a lot of allergy symptoms and COPD-saw pulmonary yesterday. Denies chest pain, palpitations, edema dizziness. BP has been doing well. Walking 15 min daily.  Past Medical History:  Diagnosis Date  . COPD (chronic obstructive pulmonary disease) (HCC)   . Coronary artery disease   . GERD (gastroesophageal reflux disease)   . Headache    "probably weekly" (02/24/2018)  . Hepatitis 1972   "don't remember what kind" (02/24/2018)  . Hyperlipidemia   . Hypertension   . Myocardial infarct [facility]) 2004   s/p stent placement   . Pneumonia    "once" (02/24/2018)    Past Surgical History:  Procedure Laterality Date  . AUGMENTATION MAMMAPLASTY Bilateral ~ 2008   implants  . BREAST BIOPSY Right    "negative"  . CARDIAC CATHETERIZATION N/A 11/28/2014   Procedure: Left Heart Cath and Coronary Angiography;  Surgeon: Lennette Bihari, MD;  Location: [facility] INVASIVE CV LAB;  Service: Cardiovascular;  Laterality: N/A;  . CORONARY ANGIOPLASTY WITH STENT PLACEMENT  2004   with stent  placement to the right coronary.  Elizebeth Brooking Osteotomy with Bone Graft Right 11/24/2012    @ [facility]  . KNEE ARTHROSCOPY Left 2010  . Plantar Endoscopic Fasciotomy Right 11/24/2012   @ [facility]    Current Medications: Current Meds  Medication Sig  . acetaminophen (TYLENOL) 500 MG tablet Take 1,000 mg by mouth every 6 (six) hours as needed for headache.  . Albuterol Sulfate (VENTOLIN HFA IN) Inhale 2 puffs into the lungs as needed (Wheezing).  Marland Kitchen aspirin 81 MG tablet Take 81 mg by mouth daily. Reported on 11/27/2015  . budesonide-formoterol (SYMBICORT) 160-4.5 MCG/ACT inhaler Inhale 2 puffs into the lungs 2 (two) times daily.  . calcium-vitamin D (OSCAL WITH D 500-200) 500-200 MG-UNIT per tablet Take 1 tablet by mouth 2 (two) times daily.  . famotidine (PEPCID) 40 MG tablet TAKE 1 TABLET(40 MG) BY MOUTH DAILY  . losartan-hydrochlorothiazide (HYZAAR) 100-25 MG tablet Take 0.5 tablets by mouth daily.  . montelukast (SINGULAIR) 10 MG tablet TAKE 1 TABLET(10 MG) BY MOUTH AT BEDTIME  . Multiple Vitamin (MULTIVITAMIN) tablet Take 1 tablet by mouth daily.  . nitroGLYCERIN (NITROSTAT) 0.4 MG SL tablet Place 0.4 mg under the tongue every 5 (five) minutes as needed for chest pain (MAX 3 TABLETS).  Marland Kitchen omeprazole (PRILOSEC) 40 MG capsule Take 1 capsule (40 mg total) by mouth daily.  Marland Kitchen omeprazole (PRILOSEC) 40 MG capsule Take 1 capsule (40 mg total) by mouth daily.  Marland Kitchen PARoxetine (PAXIL) 20 MG tablet Take 1 tablet (20 mg total) by mouth  daily.  . Tiotropium Bromide Monohydrate (SPIRIVA RESPIMAT) 2.5 MCG/ACT AERS Inhale 2 puffs into the lungs daily.  . [DISCONTINUED] amLODipine (NORVASC) 5 MG tablet TAKE 1 TABLET(5 MG) BY MOUTH DAILY. PLEASE MAKE OVERDUE APPOINTMENT WITH DOCTOR ROSS BEFORE ANYMORE REFILLS. THANK YOU FIRST ATTEMPT  . [DISCONTINUED] atorvastatin (LIPITOR) 40 MG tablet TAKE 1 TABLET(40 MG) BY MOUTH DAILY. PLEASE MAKE OVERDUE APPOINTMENT WITH DOCTOR ROSS BEFORE ANYMORE REFILLS. THANK YOU FIRST ATTEMPT  . [DISCONTINUED] ezetimibe (ZETIA) 10 MG tablet TAKE 1 TABLET(10 MG) BY MOUTH DAILY. PLEASE  MAKE OVERDUE APPOINTMENT WITH DOCTOR ROSS BEFORE ANYMORE REFILLS. THANK YOU FIRST ATTEMPT  . [DISCONTINUED] metoprolol tartrate (LOPRESSOR) 25 MG tablet TAKE 1 TABLET(25 MG) BY MOUTH TWICE DAILY. PLEASE MAKE OVERDUE APPOINTMENT WITH DOCTOR ROSS BEFORE ANYMORE REFILLS. THANK YOU FIRST ATTEMPT   Current Facility-Administered Medications for the 10/02/20 encounter (Office Visit) with Dyann KiefLenze, Earmon Sherrow M, PA-C  Medication  . diclofenac sodium (VOLTAREN) 1 % transdermal gel 2 g     Allergies:   Patient has no known allergies.   Social History   Socioeconomic History  . Marital status: Divorced    Spouse name: Not on file  . Number of children: Not on file  . Years of education: Not on file  . Highest education level: Not on file  Occupational History  . Not on file  Tobacco Use  . Smoking status: Former Smoker    Packs/day: 1.50    Years: 20.00    Pack years: 30.00    Start date: 07/24/1971    Quit date: 01/18/1998    Years since quitting: 22.7  . Smokeless tobacco: Never Used  Vaping Use  . Vaping Use: Never used  Substance and Sexual Activity  . Alcohol use: Not Currently    Comment: 02/24/2018 "just in my teens"  . Drug use: Never  . Sexual activity: Not Currently  Other Topics Concern  . Not on file  Social History Narrative  . Not on file   Social Determinants of Health   Financial Resource Strain: Not on file  Food Insecurity: Not on file  Transportation Needs: Not on file  Physical Activity: Not on file  Stress: Not on file  Social Connections: Not on file     Family History:  The patient's family history includes Alzheimer's disease (age of onset: 3790) in her mother; Heart attack in her sister; Stroke (age of onset: 3760) in her father.   ROS:   Please see the history of present illness.    ROS All other systems reviewed and are negative.   PHYSICAL EXAM:   VS:  BP 126/82   Pulse (!) 58   Ht 5\' 3"  (1.6 m)   Wt 193 lb 6.4 oz (87.7 kg)   SpO2 97%   BMI 34.26  kg/m   Physical Exam  GEN: Obese, in no acute distress  Neck: no JVD, carotid bruits, or masses Cardiac:RRR; no murmurs, rubs, or gallops  Respiratory:  clear to auscultation bilaterally, normal work of breathing GI: soft, nontender, nondistended, + BS Ext: without cyanosis, clubbing, or edema, Good distal pulses bilaterally Neuro:  Alert and Oriented x 3 Psych: euthymic mood, full affect  Wt Readings from Last 3 Encounters:  10/02/20 193 lb 6.4 oz (87.7 kg)  10/01/20 193 lb (87.5 kg)  08/21/20 195 lb 12.8 oz (88.8 kg)      Studies/Labs Reviewed:   EKG:  EKG is  ordered today.  The ekg ordered today demonstrates normal sinus rhythm, normal EKG,  no change  Recent Labs: 10/10/2019: ALT 22; BUN 17; Creatinine, Ser 0.80; Hemoglobin 14.1; Platelets 392; Potassium 4.4; Sodium 138; TSH 2.400   Lipid Panel    Component Value Date/Time   CHOL 152 10/10/2019 1337   TRIG 142 10/10/2019 1337   HDL 45 10/10/2019 1337   CHOLHDL 3.4 10/10/2019 1337   CHOLHDL 3.6 06/20/2015 0849   VLDL 24 06/20/2015 0849   LDLCALC 82 10/10/2019 1337   LDLDIRECT 99 08/02/2012 0917    Additional studies/ records that were reviewed today include:   2D echo 2019 Study Conclusions   - Left ventricle: The cavity size was normal. There was moderate    concentric hypertrophy. Systolic function was normal. The    estimated ejection fraction was in the range of 60% to 65%. Wall    motion was normal; there were no regional wall motion    abnormalities. Doppler parameters are consistent with abnormal    left ventricular relaxation (grade 1 diastolic dysfunction).    There was no evidence of elevated ventricular filling pressure by    Doppler parameters.  - Aortic valve: There was no regurgitation.  - Mitral valve: Valve area by pressure half-time: 2.22 cm^2.  - Left atrium: The atrium was normal in size.  - Right ventricle: Systolic function was normal.  - Tricuspid valve: There was mild regurgitation.  -  Pulmonic valve: There was no regurgitation.  - Pulmonary arteries: Systolic pressure was within the normal    range.  - Inferior vena cava: The vessel was normal in size.  - Pericardium, extracardiac: There was no pericardial effusion.  Cardiac cath 11/2014  Prox RCA to Mid RCA lesion, 20% stenosed. The lesion was previously treated with a stent (unknown type) .  Mid Cx lesion, 30% stenosed.   Preserved LV function with an ejection fraction of 55-60% with a small region of mid inferior hypocontractility.   No significant coronary obstructive disease with normal left main and LAD; 30% smooth narrowing in the circumflex vessel after the OM1 vessel; and widely patent stent in the mid RCA with smooth 20% intimal hyperplasia and evidence for minimal ostial RCA calcification.   RECOMMENDATION:   Medical therapy.     Risk Assessment/Calculations:         ASSESSMENT:    1. Coronary artery disease involving native coronary artery of native heart without angina pectoris   2. Essential hypertension   3. Hyperlipidemia, unspecified hyperlipidemia type      PLAN:  In order of problems listed above:  CAD status post inferior wall MI 2004 stent to the RCA, cath in 2016 minimal CAD patent stent no angina, continue amlodipine metoprolol and Hyzaar-150 min exercise weekly, check labs  Hypertension well-controlled continue current treatment  Hyperlipidemia check fasting lipid panel today, continue atorvastatin and Zetia  Shared Decision Making/Informed Consent        Medication Adjustments/Labs and Tests Ordered: Current medicines are reviewed at length with the patient today.  Concerns regarding medicines are outlined above.  Medication changes, Labs and Tests ordered today are listed in the Patient Instructions below. Patient Instructions   Medication Instructions:  Your physician recommends that you continue on your current medications as directed. Please refer to the Current  Medication list given to you today.  Labwork: You will have labs drawn today: CBC, CMET, FLPs, and TSH  Testing/Procedures: None ordered.  Follow-Up: Your physician recommends that you schedule a follow-up appointment in:   12 months with Dr. Tenny Craw  Any Other Special  Instructions Will Be Listed Below (If Applicable).  You should try and get in 150 minutes of exercise every week.   If you need a refill on your cardiac medications before your next appointment, please call your pharmacy.  Exercising to Stay Healthy To become healthy and stay healthy, it is recommended that you do moderate-intensity and vigorous-intensity exercise. You can tell that you are exercising at a moderate intensity if your heart starts beating faster and you start breathing faster but can still hold a conversation. You can tell that you are exercising at a vigorous intensity if you are breathing much harder and faster and cannot hold a conversation while exercising. Exercising regularly is important. It has many health benefits, such as:  Improving overall fitness, flexibility, and endurance.  Increasing bone density.  Helping with weight control.  Decreasing body fat.  Increasing muscle strength.  Reducing stress and tension.  Improving overall health. How often should I exercise? Choose an activity that you enjoy, and set realistic goals. Your health care provider can help you make an activity plan that works for you. Exercise regularly as told by your health care provider. This may include:  Doing strength training two times a week, such as: ? Lifting weights. ? Using resistance bands. ? Push-ups. ? Sit-ups. ? Yoga.  Doing a certain intensity of exercise for a given amount of time. Choose from these options: ? A total of 150 minutes of moderate-intensity exercise every week. ? A total of 75 minutes of vigorous-intensity exercise every week. ? A mix of moderate-intensity and  vigorous-intensity exercise every week. Children, pregnant women, people who have not exercised regularly, people who are overweight, and older adults may need to talk with a health care provider about what activities are safe to do. If you have a medical condition, be sure to talk with your health care provider before you start a new exercise program. What are some exercise ideas? Moderate-intensity exercise ideas include:  Walking 1 mile (1.6 km) in about 15 minutes.  Biking.  Hiking.  Golfing.  Dancing.  Water aerobics. Vigorous-intensity exercise ideas include:  Walking 4.5 miles (7.2 km) or more in about 1 hour.  Jogging or running 5 miles (8 km) in about 1 hour.  Biking 10 miles (16.1 km) or more in about 1 hour.  Lap swimming.  Roller-skating or in-line skating.  Cross-country skiing.  Vigorous competitive sports, such as football, basketball, and soccer.  Jumping rope.  Aerobic dancing.   What are some everyday activities that can help me to get exercise?  Yard work, such as: ? Pushing a Surveyor, mining. ? Raking and bagging leaves.  Washing your car.  Pushing a stroller.  Shoveling snow.  Gardening.  Washing windows or floors. How can I be more active in my day-to-day activities?  Use stairs instead of an elevator.  Take a walk during your lunch break.  If you drive, park your car farther away from your work or school.  If you take public transportation, get off one stop early and walk the rest of the way.  Stand up or walk around during all of your indoor phone calls.  Get up, stretch, and walk around every 30 minutes throughout the day.  Enjoy exercise with a friend. Support to continue exercising will help you keep a regular routine of activity. What guidelines can I follow while exercising?  Before you start a new exercise program, talk with your health care provider.  Do not exercise so much  that you hurt yourself, feel dizzy, or get very  short of breath.  Wear comfortable clothes and wear shoes with good support.  Drink plenty of water while you exercise to prevent dehydration or heat stroke.  Work out until your breathing and your heartbeat get faster. Where to find more information  U.S. Department of Health and Human Services: ThisPath.fi  Centers for Disease Control and Prevention (CDC): FootballExhibition.com.br Summary  Exercising regularly is important. It will improve your overall fitness, flexibility, and endurance.  Regular exercise also will improve your overall health. It can help you control your weight, reduce stress, and improve your bone density.  Do not exercise so much that you hurt yourself, feel dizzy, or get very short of breath.  Before you start a new exercise program, talk with your health care provider. This information is not intended to replace advice given to you by your health care provider. Make sure you discuss any questions you have with your health care provider. Document Revised: 04/09/2017 Document Reviewed: 03/18/2017 Elsevier Patient Education  [address]      Signed, Jacolyn Reedy, [place]  10/02/2020 9:15 AM    [facility] [address] [place], [place], [place]  [address] Phone: [phone]; Fax: [phone]

## 2020-09-30 ENCOUNTER — Other Ambulatory Visit: Payer: Self-pay | Admitting: Pulmonary Disease

## 2020-09-30 ENCOUNTER — Other Ambulatory Visit: Payer: Self-pay | Admitting: Internal Medicine

## 2020-10-01 ENCOUNTER — Ambulatory Visit: Payer: 59 | Admitting: Pulmonary Disease

## 2020-10-01 ENCOUNTER — Encounter: Payer: Self-pay | Admitting: Pulmonary Disease

## 2020-10-01 ENCOUNTER — Other Ambulatory Visit: Payer: Self-pay

## 2020-10-01 VITALS — BP 114/72 | HR 68 | Temp 97.4°F | Ht 63.0 in | Wt 193.0 lb

## 2020-10-01 DIAGNOSIS — R918 Other nonspecific abnormal finding of lung field: Secondary | ICD-10-CM

## 2020-10-01 NOTE — Progress Notes (Signed)
Jamie Pennington    [id number]    June 01, 1955  Primary Care Physician:Chambliss, Estill Batten, MD  Referring Physician: Carney Living, MD [address] [place],  [place] [address]  Chief complaint:   Follow-up for COPD  HPI:  No significant complaints Has been having some issues with allergies  Occasional nonproductive cough  History of COPD Currently on Symbicort, Spiriva, albuterol as needed-continues to benefit from it Quit smoking in 1999 Denies any chest pains or chest discomfort Shortness of breath with moderate exertion No pertinent occupational history  Outpatient Encounter Medications as of 10/01/2020  Medication Sig  . acetaminophen (TYLENOL) 500 MG tablet Take 1,000 mg by mouth every 6 (six) hours as needed for headache.  . Albuterol Sulfate (VENTOLIN HFA IN) Inhale 2 puffs into the lungs as needed (Wheezing).  Marland Kitchen amLODipine (NORVASC) 5 MG tablet TAKE 1 TABLET(5 MG) BY MOUTH DAILY. PLEASE MAKE OVERDUE APPOINTMENT WITH DOCTOR ROSS BEFORE ANYMORE REFILLS. THANK YOU FIRST ATTEMPT  . aspirin 81 MG tablet Take 81 mg by mouth daily. Reported on 11/27/2015  . atorvastatin (LIPITOR) 40 MG tablet TAKE 1 TABLET(40 MG) BY MOUTH DAILY. PLEASE MAKE OVERDUE APPOINTMENT WITH DOCTOR ROSS BEFORE ANYMORE REFILLS. THANK YOU FIRST ATTEMPT  . budesonide-formoterol (SYMBICORT) 160-4.5 MCG/ACT inhaler Inhale 2 puffs into the lungs 2 (two) times daily.  . calcium-vitamin D (OSCAL WITH D 500-200) 500-200 MG-UNIT per tablet Take 1 tablet by mouth 2 (two) times daily.  Marland Kitchen ezetimibe (ZETIA) 10 MG tablet TAKE 1 TABLET(10 MG) BY MOUTH DAILY. PLEASE MAKE OVERDUE APPOINTMENT WITH DOCTOR ROSS BEFORE ANYMORE REFILLS. THANK YOU FIRST ATTEMPT  . famotidine (PEPCID) 40 MG tablet TAKE 1 TABLET(40 MG) BY MOUTH DAILY  . losartan-hydrochlorothiazide (HYZAAR) 100-25 MG tablet Take 0.5 tablets by mouth daily.  . metoprolol tartrate (LOPRESSOR) 25 MG tablet TAKE 1 TABLET(25 MG) BY MOUTH TWICE  DAILY. PLEASE MAKE OVERDUE APPOINTMENT WITH DOCTOR ROSS BEFORE ANYMORE REFILLS. THANK YOU FIRST ATTEMPT  . montelukast (SINGULAIR) 10 MG tablet TAKE 1 TABLET(10 MG) BY MOUTH AT BEDTIME  . Multiple Vitamin (MULTIVITAMIN) tablet Take 1 tablet by mouth daily.  . nitroGLYCERIN (NITROSTAT) 0.4 MG SL tablet Place 0.4 mg under the tongue every 5 (five) minutes as needed for chest pain (MAX 3 TABLETS).  Marland Kitchen omeprazole (PRILOSEC) 40 MG capsule Take 1 capsule (40 mg total) by mouth daily.  Marland Kitchen omeprazole (PRILOSEC) 40 MG capsule Take 1 capsule (40 mg total) by mouth daily.  Marland Kitchen PARoxetine (PAXIL) 20 MG tablet Take 1 tablet (20 mg total) by mouth daily.  . Tiotropium Bromide Monohydrate (SPIRIVA RESPIMAT) 2.5 MCG/ACT AERS Inhale 2 puffs into the lungs daily.  . [DISCONTINUED] DULERA 200-5 MCG/ACT AERO INHALE 1 PUFF INTO THE LUNGS TWICE DAILY   Facility-Administered Encounter Medications as of 10/01/2020  Medication  . diclofenac sodium (VOLTAREN) 1 % transdermal gel 2 g    Allergies as of 10/01/2020  . (No Known Allergies)    Past Medical History:  Diagnosis Date  . COPD (chronic obstructive pulmonary disease) (HCC)   . Coronary artery disease   . GERD (gastroesophageal reflux disease)   . Headache    "probably weekly" (02/24/2018)  . Hepatitis 1972   "don't remember what kind" (02/24/2018)  . Hyperlipidemia   . Hypertension   . Myocardial infarct [facility]) 2004   s/p stent placement   . Pneumonia    "once" (02/24/2018)    Past Surgical History:  Procedure Laterality Date  . AUGMENTATION MAMMAPLASTY  Bilateral ~ 2008   implants  . BREAST BIOPSY Right    "negative"  . CARDIAC CATHETERIZATION N/A 11/28/2014   Procedure: Left Heart Cath and Coronary Angiography;  Surgeon: Lennette Bihari, MD;  Location: [facility] INVASIVE CV LAB;  Service: Cardiovascular;  Laterality: N/A;  . CORONARY ANGIOPLASTY WITH STENT PLACEMENT  2004   with stent  placement to the right coronary.  Elizebeth Brooking Osteotomy with Bone Graft  Right 11/24/2012   @ [facility]  . KNEE ARTHROSCOPY Left 2010  . Plantar Endoscopic Fasciotomy Right 11/24/2012   @ [facility]    Family History  Problem Relation Age of Onset  . Stroke Father 59       died  . Alzheimer's disease Mother 73       died  . Heart attack Sister        and bypass surgery    Social History   Socioeconomic History  . Marital status: Divorced    Spouse name: Not on file  . Number of children: Not on file  . Years of education: Not on file  . Highest education level: Not on file  Occupational History  . Not on file  Tobacco Use  . Smoking status: Former Smoker    Packs/day: 1.50    Years: 20.00    Pack years: 30.00    Start date: 07/24/1971    Quit date: 01/18/1998    Years since quitting: 22.7  . Smokeless tobacco: Never Used  Vaping Use  . Vaping Use: Never used  Substance and Sexual Activity  . Alcohol use: Not Currently    Comment: 02/24/2018 "just in my teens"  . Drug use: Never  . Sexual activity: Not Currently  Other Topics Concern  . Not on file  Social History Narrative  . Not on file   Social Determinants of Health   Financial Resource Strain: Not on file  Food Insecurity: Not on file  Transportation Needs: Not on file  Physical Activity: Not on file  Stress: Not on file  Social Connections: Not on file  Intimate Partner Violence: Not on file    Review of Systems  Constitutional: Negative.   HENT: Negative.   Eyes: Negative.   Respiratory: Positive for cough. Negative for shortness of breath.   Cardiovascular: Negative.   Gastrointestinal: Negative.   Endocrine: Negative.   Psychiatric/Behavioral: Negative for sleep disturbance.    Vitals:   10/01/20 1006  BP: 114/72  Pulse: 68  Temp: (!) 97.4 F (36.3 C)  SpO2: 94%     Physical Exam Constitutional:      Appearance: Normal appearance. She is well-developed.  HENT:     Head: Normocephalic and atraumatic.  Eyes:     General: No scleral icterus.       Right eye: No  discharge.        Left eye: No discharge.  Neck:     Thyroid: No thyromegaly.     Trachea: No tracheal deviation.  Cardiovascular:     Rate and Rhythm: Normal rate and regular rhythm.  Pulmonary:     Effort: Pulmonary effort is normal. No respiratory distress.     Breath sounds: Normal breath sounds. No stridor. No wheezing, rhonchi or rales.  Musculoskeletal:     Cervical back: No rigidity or tenderness.  Neurological:     Mental Status: She is alert.  Psychiatric:        Mood and Affect: Mood normal.    Data Reviewed: Chest x-ray 02/25/2018 shows no  acute infiltrate  CT scan of the chest was reviewed with the patient, noted extensive emphysema, lower lobe lung nodules  Assessment:  History of COPD/emphysema -Continue bronchodilators -Continue Symbicort and Spiriva -Rescue inhaler as needed  Stable lung nodule-needs follow-up -Will order for repeat CT scan of the chest  Reformed smoker  Encouraged regular exercises   Plan/Recommendations: Repeat CT scan of the chest  Graded exercise as tolerated  Continue Spiriva and Symbicort  Tentative follow-up in 6 months   Virl Diamond MD [facility] 10/01/2020, 10:11 AM  CC: Carney Living, *

## 2020-10-01 NOTE — Patient Instructions (Signed)
COPD/emphysema Allergies  Avoid any triggers for your allergies let you know about Nasal steroids like Flonase/Nasonex may be tried to help nasal stuffiness and congestion Medications like Allegra, Xyzal can also be tried for allergies  Not making any changes to your inhalers  I will see you back in about 6 months  We will repeat your CT scan to follow-up on the spots in the lungs

## 2020-10-02 ENCOUNTER — Encounter: Payer: Self-pay | Admitting: Physician Assistant

## 2020-10-02 ENCOUNTER — Ambulatory Visit: Payer: 59 | Admitting: Physician Assistant

## 2020-10-02 VITALS — BP 126/82 | HR 58 | Ht 63.0 in | Wt 193.4 lb

## 2020-10-02 DIAGNOSIS — E785 Hyperlipidemia, unspecified: Secondary | ICD-10-CM

## 2020-10-02 DIAGNOSIS — I2510 Atherosclerotic heart disease of native coronary artery without angina pectoris: Secondary | ICD-10-CM

## 2020-10-02 DIAGNOSIS — I10 Essential (primary) hypertension: Secondary | ICD-10-CM

## 2020-10-02 LAB — CBC
Hematocrit: 42.4 % (ref 34.0–46.6)
Hemoglobin: 13.5 g/dL (ref 11.1–15.9)
MCH: 26.2 pg — ABNORMAL LOW (ref 26.6–33.0)
MCHC: 31.8 g/dL (ref 31.5–35.7)
MCV: 82 fL (ref 79–97)
Platelets: 381 10*3/uL (ref 150–450)
RBC: 5.16 x10E6/uL (ref 3.77–5.28)
RDW: 15.1 % (ref 11.7–15.4)
WBC: 6.5 10*3/uL (ref 3.4–10.8)

## 2020-10-02 LAB — COMPREHENSIVE METABOLIC PANEL
ALT: 20 IU/L (ref 0–32)
AST: 15 IU/L (ref 0–40)
Albumin/Globulin Ratio: 1.7 (ref 1.2–2.2)
Albumin: 3.8 g/dL (ref 3.8–4.8)
Alkaline Phosphatase: 102 IU/L (ref 44–121)
BUN/Creatinine Ratio: 26 (ref 12–28)
BUN: 18 mg/dL (ref 8–27)
Bilirubin Total: 0.4 mg/dL (ref 0.0–1.2)
CO2: 22 mmol/L (ref 20–29)
Calcium: 8.6 mg/dL — ABNORMAL LOW (ref 8.7–10.3)
Chloride: 102 mmol/L (ref 96–106)
Creatinine, Ser: 0.68 mg/dL (ref 0.57–1.00)
Globulin, Total: 2.3 g/dL (ref 1.5–4.5)
Glucose: 119 mg/dL — ABNORMAL HIGH (ref 65–99)
Potassium: 4.2 mmol/L (ref 3.5–5.2)
Sodium: 140 mmol/L (ref 134–144)
Total Protein: 6.1 g/dL (ref 6.0–8.5)
eGFR: 96 mL/min/{1.73_m2} (ref >59)

## 2020-10-02 LAB — LIPID PANEL
Chol/HDL Ratio: 3.3 ratio (ref 0.0–4.4)
Cholesterol, Total: 134 mg/dL (ref 100–199)
HDL: 41 mg/dL (ref >39)
LDL Chol Calc (NIH): 71 mg/dL (ref 0–99)
Triglycerides: 121 mg/dL (ref 0–149)
VLDL Cholesterol Cal: 22 mg/dL (ref 5–40)

## 2020-10-02 LAB — TSH: TSH: 3.35 u[IU]/mL (ref 0.450–4.500)

## 2020-10-02 MED ORDER — METOPROLOL TARTRATE 25 MG PO TABS: ORAL_TABLET | ORAL | 11 refills | Status: DC

## 2020-10-02 MED ORDER — ATORVASTATIN CALCIUM 40 MG PO TABS: ORAL_TABLET | ORAL | 11 refills | Status: DC

## 2020-10-02 MED ORDER — EZETIMIBE 10 MG PO TABS: ORAL_TABLET | ORAL | 11 refills | Status: DC

## 2020-10-02 MED ORDER — AMLODIPINE BESYLATE 5 MG PO TABS: ORAL_TABLET | ORAL | 11 refills | Status: DC

## 2020-10-02 NOTE — Patient Instructions (Signed)
Medication Instructions:  Your physician recommends that you continue on your current medications as directed. Please refer to the Current Medication list given to you today.  Labwork: You will have labs drawn today: CBC, CMET, FLPs, and TSH  Testing/Procedures: None ordered.  Follow-Up: Your physician recommends that you schedule a follow-up appointment in:   12 months with Dr. Tenny Craw  Any Other Special Instructions Will Be Listed Below (If Applicable).  You should try and get in 150 minutes of exercise every week.   If you need a refill on your cardiac medications before your next appointment, please call your pharmacy.  Exercising to Stay Healthy To become healthy and stay healthy, it is recommended that you do moderate-intensity and vigorous-intensity exercise. You can tell that you are exercising at a moderate intensity if your heart starts beating faster and you start breathing faster but can still hold a conversation. You can tell that you are exercising at a vigorous intensity if you are breathing much harder and faster and cannot hold a conversation while exercising. Exercising regularly is important. It has many health benefits, such as:  Improving overall fitness, flexibility, and endurance.  Increasing bone density.  Helping with weight control.  Decreasing body fat.  Increasing muscle strength.  Reducing stress and tension.  Improving overall health. How often should I exercise? Choose an activity that you enjoy, and set realistic goals. Your health care provider can help you make an activity plan that works for you. Exercise regularly as told by your health care provider. This may include:  Doing strength training two times a week, such as: ? Lifting weights. ? Using resistance bands. ? Push-ups. ? Sit-ups. ? Yoga.  Doing a certain intensity of exercise for a given amount of time. Choose from these options: ? A total of 150 minutes of moderate-intensity  exercise every week. ? A total of 75 minutes of vigorous-intensity exercise every week. ? A mix of moderate-intensity and vigorous-intensity exercise every week. Children, pregnant women, people who have not exercised regularly, people who are overweight, and older adults may need to talk with a health care provider about what activities are safe to do. If you have a medical condition, be sure to talk with your health care provider before you start a new exercise program. What are some exercise ideas? Moderate-intensity exercise ideas include:  Walking 1 mile (1.6 km) in about 15 minutes.  Biking.  Hiking.  Golfing.  Dancing.  Water aerobics. Vigorous-intensity exercise ideas include:  Walking 4.5 miles (7.2 km) or more in about 1 hour.  Jogging or running 5 miles (8 km) in about 1 hour.  Biking 10 miles (16.1 km) or more in about 1 hour.  Lap swimming.  Roller-skating or in-line skating.  Cross-country skiing.  Vigorous competitive sports, such as football, basketball, and soccer.  Jumping rope.  Aerobic dancing.   What are some everyday activities that can help me to get exercise?  Yard work, such as: ? Pushing a Surveyor, mining. ? Raking and bagging leaves.  Washing your car.  Pushing a stroller.  Shoveling snow.  Gardening.  Washing windows or floors. How can I be more active in my day-to-day activities?  Use stairs instead of an elevator.  Take a walk during your lunch break.  If you drive, park your car farther away from your work or school.  If you take public transportation, get off one stop early and walk the rest of the way.  Stand up or walk around  during all of your indoor phone calls.  Get up, stretch, and walk around every 30 minutes throughout the day.  Enjoy exercise with a friend. Support to continue exercising will help you keep a regular routine of activity. What guidelines can I follow while exercising?  Before you start a new  exercise program, talk with your health care provider.  Do not exercise so much that you hurt yourself, feel dizzy, or get very short of breath.  Wear comfortable clothes and wear shoes with good support.  Drink plenty of water while you exercise to prevent dehydration or heat stroke.  Work out until your breathing and your heartbeat get faster. Where to find more information  U.S. Department of Health and Human Services: ThisPath.fi  Centers for Disease Control and Prevention (CDC): FootballExhibition.com.br Summary  Exercising regularly is important. It will improve your overall fitness, flexibility, and endurance.  Regular exercise also will improve your overall health. It can help you control your weight, reduce stress, and improve your bone density.  Do not exercise so much that you hurt yourself, feel dizzy, or get very short of breath.  Before you start a new exercise program, talk with your health care provider. This information is not intended to replace advice given to you by your health care provider. Make sure you discuss any questions you have with your health care provider. Document Revised: 04/09/2017 Document Reviewed: 03/18/2017 Elsevier Patient Education  2021 [employer].

## 2020-10-04 ENCOUNTER — Other Ambulatory Visit: Payer: Self-pay

## 2020-10-04 ENCOUNTER — Ambulatory Visit (INDEPENDENT_AMBULATORY_CARE_PROVIDER_SITE_OTHER)
Admission: RE | Admit: 2020-10-04 | Discharge: 2020-10-04 | Disposition: A | Discharge status: Home / Self Care | Payer: 59 | Source: Ambulatory Visit | Attending: Pulmonary Disease | Admitting: Pulmonary Disease

## 2020-10-04 DIAGNOSIS — R918 Other nonspecific abnormal finding of lung field: Secondary | ICD-10-CM | POA: Diagnosis not present

## 2020-10-15 ENCOUNTER — Telehealth: Payer: Self-pay | Admitting: Pulmonary Disease

## 2020-10-15 NOTE — Telephone Encounter (Signed)
Called and spoke with Patient. Patient requested CT chest results from 10/04/20.   Message routed to Dr. Wynona Neat to advise

## 2020-10-21 NOTE — Telephone Encounter (Signed)
Called and spoke with patient, advised of results/recommendations per Dr. Wynona Neat.  Nothing further needed.

## 2020-10-21 NOTE — Telephone Encounter (Signed)
CT reviewed  Multiple lung nodules that have not changed from CT from 2020 Emphysema  No further CT needed to follow-up on nodules unless other indications

## 2020-12-05 ENCOUNTER — Other Ambulatory Visit: Payer: Self-pay

## 2020-12-05 MED ORDER — BUDESONIDE-FORMOTEROL FUMARATE 160-4.5 MCG/ACT IN AERO: 2.0000 | INHALATION_SPRAY | Freq: Two times a day (BID) | RESPIRATORY_TRACT | 5 refills | Status: DC

## 2021-01-28 ENCOUNTER — Other Ambulatory Visit: Payer: Self-pay | Admitting: Pulmonary Disease

## 2021-01-29 ENCOUNTER — Other Ambulatory Visit: Payer: Self-pay

## 2021-01-29 MED ORDER — SPIRIVA RESPIMAT 2.5 MCG/ACT IN AERS: 2.0000 | INHALATION_SPRAY | Freq: Every day | RESPIRATORY_TRACT | 5 refills | Status: DC

## 2021-03-03 NOTE — Progress Notes (Signed)
    SUBJECTIVE:   CHIEF COMPLAINT / HPI:   Mood Feels much better than last visit.  Has been off Paxil for several months.  Still gets fatigued with activities but has the desire to do things  Hypertension Brings in all her medications.  Does not have Hyzaar.  Thinks has not been taking for several months  Edema - unchanged. Worse at end of day.  No changes in her exercise capacity    PERTINENT  PMH / PSH: [place] winter away from work.    OBJECTIVE:   BP 138/67   Pulse 86   Wt 194 lb 9.6 oz (88.3 kg)   SpO2 95%   BMI 34.47 kg/m   Psych:  Cognition and judgment appear intact. Alert, communicative  and cooperative with normal attention span and concentration. No apparent delusions, illusions, hallucinations Heart - Regular rate and rhythm.  No murmurs, gallops or rubs.    Lungs:  Normal respiratory effort, chest expands symmetrically. Lungs are clear to auscultation, no crackles or wheezes. Extrem - 1+ edema at ankles   ASSESSMENT/PLAN:   Annual Examination Female over 66 yo  I reviewed the following patient responses on our Physical Exam Form Tobacco use and candidacy for lung cancer screening - done Alcohol Use  Weight  Exercise  Fall risk   PHQ9 score reviewed  Blood pressure reviewed  I considered the following items based upon USPSTF recommendations: Cervical Cancer if female at birth Mammogram Colon cancer screening Osteoporosis screening HIV testing:  Hepatitis C testing Cholesterol screening STI screening if high risk (Hepatitis B, Syphilis, Gonorrhea, Chlamydia) Immunizations - Influenza, Covid, [place], Pneumonia, Tetanus  See After Visit Summary for recommendations   HYPERTENSION, BENIGN SYSTEMIC At goal.  Did not have Hyzaar with her medications.  Asked her to let her cardiologist know next visit   Memory difficulties Improved.  No signs of depression and doing well off Paxil.    Fatigue More problems with feeling tired with  exercise.  Does not sound like angina. Maybe some deconditioning.  Suggest discuss pulmonary cardiac rehab with her pulmonologist or cardiologist   Depression Doing well off Paxil.       Carney Living, MD [facility]

## 2021-03-03 NOTE — Patient Instructions (Addendum)
Good to see you today - Thank you for coming in  Things we discussed today:  Compression stockings for the swelling   Consider cardiac or pulmonary rehab exercise when back from [facility].  Get your flu shot  Call me when you want to get the Shingles Shot   Please always bring your medication bottles  Come back to see me in 12 months

## 2021-03-04 ENCOUNTER — Other Ambulatory Visit: Payer: Self-pay

## 2021-03-04 ENCOUNTER — Ambulatory Visit (INDEPENDENT_AMBULATORY_CARE_PROVIDER_SITE_OTHER): Payer: 59 | Admitting: Family Medicine

## 2021-03-04 ENCOUNTER — Encounter: Payer: Self-pay | Admitting: Family Medicine

## 2021-03-04 DIAGNOSIS — R5383 Other fatigue: Secondary | ICD-10-CM | POA: Diagnosis not present

## 2021-03-04 DIAGNOSIS — R413 Other amnesia: Secondary | ICD-10-CM | POA: Diagnosis not present

## 2021-03-04 DIAGNOSIS — F321 Major depressive disorder, single episode, moderate: Secondary | ICD-10-CM

## 2021-03-04 DIAGNOSIS — I10 Essential (primary) hypertension: Secondary | ICD-10-CM | POA: Diagnosis not present

## 2021-03-04 NOTE — Assessment & Plan Note (Signed)
Doing well off Paxil.

## 2021-03-04 NOTE — Assessment & Plan Note (Signed)
More problems with feeling tired with exercise.  Does not sound like angina. Maybe some deconditioning.  Suggest discuss pulmonary cardiac rehab with her pulmonologist or cardiologist

## 2021-03-04 NOTE — Assessment & Plan Note (Signed)
At goal.  Did not have Hyzaar with her medications.  Asked her to let her cardiologist know next visit

## 2021-03-04 NOTE — Assessment & Plan Note (Signed)
Improved.  No signs of depression and doing well off Paxil.

## 2021-06-21 ENCOUNTER — Other Ambulatory Visit: Payer: Self-pay | Admitting: Pulmonary Disease

## 2021-07-14 ENCOUNTER — Other Ambulatory Visit: Payer: Self-pay | Admitting: Pulmonary Disease

## 2021-08-05 ENCOUNTER — Ambulatory Visit (INDEPENDENT_AMBULATORY_CARE_PROVIDER_SITE_OTHER): Payer: 59 | Admitting: Pulmonary Disease

## 2021-08-05 ENCOUNTER — Encounter: Payer: Self-pay | Admitting: Pulmonary Disease

## 2021-08-05 ENCOUNTER — Other Ambulatory Visit: Payer: Self-pay

## 2021-08-05 VITALS — BP 124/80 | HR 77 | Temp 98.3°F | Ht 63.0 in | Wt 196.0 lb

## 2021-08-05 DIAGNOSIS — J410 Simple chronic bronchitis: Secondary | ICD-10-CM | POA: Diagnosis not present

## 2021-08-05 MED ORDER — TRELEGY ELLIPTA 100-62.5-25 MCG/ACT IN AEPB: 1.0000 | INHALATION_SPRAY | Freq: Every day | RESPIRATORY_TRACT | 3 refills | Status: DC

## 2021-08-05 MED ORDER — TRELEGY ELLIPTA 200-62.5-25 MCG/ACT IN AEPB: 1.0000 | INHALATION_SPRAY | Freq: Every day | RESPIRATORY_TRACT | 0 refills | Status: DC

## 2021-08-05 MED ORDER — ALBUTEROL SULFATE HFA 108 (90 BASE) MCG/ACT IN AERS: 2.0000 | INHALATION_SPRAY | RESPIRATORY_TRACT | 6 refills | Status: DC | PRN

## 2021-08-05 NOTE — Patient Instructions (Signed)
Switch from Symbicort and Spiriva to Trelegy 200 ?-1 puff daily ? ?Obtain pulmonary function test in about 2 months at next visit ? ?I will see you in 2 months from here ? ?We will repeat your CT scan in about a year from now ? ?Call us with significant concerns ?

## 2021-08-05 NOTE — Progress Notes (Signed)
? ?      ?Jamie Pennington    [id number]    May 11, 1956 ? ?Primary Care Physician:Chambliss, Estill Batten, MD ? ?Referring Physician: Carney Living, MD ?[address] ?[place],  [place] [address] ? ?Chief complaint:   ?Follow-up for COPD ? ?HPI:  ?No significant complaints ? ?Continues to be compliant with use of Spiriva and Symbicort ? ?Albuterol as needed ? ?Activity level is limited ?Only able to walk about a block ?Tries to stay active ? ?Occasional nonproductive cough, no chest pain or chest discomfort ? ?CT scan with multiple nodules-stable from 2000 ? ?History of COPD ?Currently on Symbicort, Spiriva, albuterol as needed-continues to benefit from it ?Quit smoking in 1999 ?Denies any chest pains or chest discomfort ?Shortness of breath with moderate exertion ?No pertinent occupational history ? ?Outpatient Encounter Medications as of 08/05/2021  ?Medication Sig  ? acetaminophen (TYLENOL) 500 MG tablet Take 1,000 mg by mouth every 6 (six) hours as needed for headache.  ? albuterol (VENTOLIN HFA) 108 (90 Base) MCG/ACT inhaler Inhale 2 puffs into the lungs as needed (Wheezing).  ? amLODipine (NORVASC) 5 MG tablet TAKE 1 TABLET DAILY  ? aspirin 81 MG tablet Take 81 mg by mouth daily. Reported on 11/27/2015  ? atorvastatin (LIPITOR) 40 MG tablet TAKE 1 TABLET(40 MG) BY MOUTH DAILY.  ? ezetimibe (ZETIA) 10 MG tablet TAKE 1 TABLET(10 MG) BY MOUTH DAILY.  ? famotidine (PEPCID) 40 MG tablet TAKE 1 TABLET(40 MG) BY MOUTH DAILY  ? Fluticasone-Umeclidin-Vilant (TRELEGY ELLIPTA) 100-62.5-25 MCG/ACT AEPB Inhale 1 puff into the lungs daily.  ? Fluticasone-Umeclidin-Vilant (TRELEGY ELLIPTA) 200-62.5-25 MCG/ACT AEPB Inhale 1 puff into the lungs daily.  ? metoprolol tartrate (LOPRESSOR) 25 MG tablet TAKE 1 TABLET(25 MG) BY MOUTH TWICE DAILY.  ? montelukast (SINGULAIR) 10 MG tablet TAKE 1 TABLET(10 MG) BY MOUTH AT BEDTIME  ? Multiple Vitamin (MULTIVITAMIN) tablet Take 1 tablet by mouth daily.  ? nitroGLYCERIN  (NITROSTAT) 0.4 MG SL tablet Place 0.4 mg under the tongue every 5 (five) minutes as needed for chest pain (MAX 3 TABLETS).  ? omeprazole (PRILOSEC) 40 MG capsule Take 1 capsule (40 mg total) by mouth daily.  ? [DISCONTINUED] Albuterol Sulfate (VENTOLIN HFA IN) Inhale 2 puffs into the lungs as needed (Wheezing).  ? [DISCONTINUED] SYMBICORT 160-4.5 MCG/ACT inhaler INHALE 2 PUFFS INTO THE LUNGS TWICE DAILY. NEED APPT FOR FURTHER REFILL  ? [DISCONTINUED] Tiotropium Bromide Monohydrate (SPIRIVA RESPIMAT) 2.5 MCG/ACT AERS Inhale 2 puffs into the lungs daily.  ? [DISCONTINUED] losartan-hydrochlorothiazide (HYZAAR) 100-25 MG tablet Take 0.5 tablets by mouth daily. (Patient not taking: Reported on 08/05/2021)  ? [DISCONTINUED] omeprazole (PRILOSEC) 40 MG capsule Take 1 capsule (40 mg total) by mouth daily. (Patient not taking: Reported on 08/05/2021)  ? ?No facility-administered encounter medications on file as of 08/05/2021.  ? ? ?Allergies as of 08/05/2021  ? (No Known Allergies)  ? ? ?Past Medical History:  ?Diagnosis Date  ? COPD (chronic obstructive pulmonary disease) (HCC)   ? Coronary artery disease   ? GERD (gastroesophageal reflux disease)   ? Headache   ? "probably weekly" (02/24/2018)  ? Hepatitis 1972  ? "don't remember what kind" (02/24/2018)  ? Hyperlipidemia   ? Hypertension   ? Myocardial infarct [facility]) 2004  ? s/p stent placement   ? Pneumonia   ? "once" (02/24/2018)  ? ? ?Past Surgical History:  ?Procedure Laterality Date  ? AUGMENTATION MAMMAPLASTY Bilateral ~ 2008  ? implants  ? BREAST BIOPSY Right   ? "negative"  ?  CARDIAC CATHETERIZATION N/A 11/28/2014  ? Procedure: Left Heart Cath and Coronary Angiography;  Surgeon: Lennette Biharihomas A Kelly, MD;  Location: [facility] INVASIVE CV LAB;  Service: Cardiovascular;  Laterality: N/A;  ? CORONARY ANGIOPLASTY WITH STENT PLACEMENT  2004  ? with stent  placement to the right coronary.  ? Cotton Osteotomy with Bone Graft Right 11/24/2012  ? @ [facility]  ? KNEE ARTHROSCOPY Left 2010  ? Plantar  Endoscopic Fasciotomy Right 11/24/2012  ? @ [facility]  ? ? ?Family History  ?Problem Relation Age of Onset  ? Stroke Father 5660  ?     died  ? Alzheimer's disease Mother 7690  ?     died  ? Heart attack Sister   ?     and bypass surgery  ? ? ?Social History  ? ?Socioeconomic History  ? Marital status: Divorced  ?  Spouse name: Not on file  ? Number of children: Not on file  ? Years of education: Not on file  ? Highest education level: Not on file  ?Occupational History  ? Not on file  ?Tobacco Use  ? Smoking status: Former  ?  Packs/day: 1.50  ?  Years: 20.00  ?  Pack years: 30.00  ?  Types: Cigarettes  ?  Start date: 07/24/1971  ?  Quit date: 01/18/1998  ?  Years since quitting: 23.5  ? Smokeless tobacco: Never  ?Vaping Use  ? Vaping Use: Never used  ?Substance and Sexual Activity  ? Alcohol use: Not Currently  ?  Comment: 02/24/2018 "just in my teens"  ? Drug use: Never  ? Sexual activity: Not Currently  ?Other Topics Concern  ? Not on file  ?Social History Narrative  ? Not on file  ? ?Social Determinants of Health  ? ?Financial Resource Strain: Not on file  ?Food Insecurity: Not on file  ?Transportation Needs: Not on file  ?Physical Activity: Not on file  ?Stress: Not on file  ?Social Connections: Not on file  ?Intimate Partner Violence: Not on file  ? ? ?Review of Systems  ?Constitutional: Negative.   ?HENT: Negative.    ?Eyes: Negative.   ?Respiratory:  Positive for cough. Negative for shortness of breath.   ?Cardiovascular: Negative.   ?Gastrointestinal: Negative.   ?Endocrine: Negative.   ?Psychiatric/Behavioral:  Negative for sleep disturbance.   ? ?Vitals:  ? 08/05/21 1143  ?BP: 124/80  ?Pulse: 77  ?Temp: 98.3 ?F (36.8 ?C)  ?SpO2: 94%  ? ? ? ?Physical Exam ?Constitutional:   ?   Appearance: Normal appearance. She is well-developed.  ?HENT:  ?   Head: Normocephalic and atraumatic.  ?Eyes:  ?   General: No scleral icterus.    ?   Right eye: No discharge.     ?   Left eye: No discharge.  ?Neck:  ?   Thyroid: No  thyromegaly.  ?   Trachea: No tracheal deviation.  ?Cardiovascular:  ?   Rate and Rhythm: Normal rate and regular rhythm.  ?Pulmonary:  ?   Effort: Pulmonary effort is normal. No respiratory distress.  ?   Breath sounds: Normal breath sounds. No stridor. No wheezing, rhonchi or rales.  ?Musculoskeletal:  ?   Cervical back: No rigidity or tenderness.  ?Neurological:  ?   Mental Status: She is alert.  ?Psychiatric:     ?   Mood and Affect: Mood normal.  ? ?Data Reviewed: ?Chest x-ray 02/25/2018 shows no acute infiltrate ? ?CT scan of the chest was reviewed with the patient,  noted extensive emphysema, lower lobe lung nodules ? ?Assessment:  ? ?Obstructive sleep apnea/emphysema ?-We will continue bronchodilators ?-With persistence of symptoms despite Symbicort and Spiriva ?-We will consider changing to Trelegy ?-Use rescue inhaler as needed ? ?Stable lung nodules from 2000 2022 ?-We will plan on repeating CT scan of the chest in about a year ? ?Reformed smoker ? ?Deconditioning ? ?Importance of graded exercises and weight management discussed with the patient ? ? ?Plan/Recommendations: ? ?We will repeat CT scan of the chest in about a year ? ?Switch from Spiriva and Symbicort to Trelegy 200 ? ?Follow-up in 6 months ? ?Encouraged to exercise on a regular basis, graded exercises as tolerated ? ? ?Virl Diamond MD ?[facility] ?08/05/2021, 1:41 PM ? ?CC: Chambliss, Estill Batten, * ? ? ?

## 2021-08-09 ENCOUNTER — Other Ambulatory Visit: Payer: Self-pay | Admitting: Pulmonary Disease

## 2021-08-11 ENCOUNTER — Other Ambulatory Visit: Payer: Self-pay

## 2021-08-12 MED ORDER — OMEPRAZOLE 40 MG PO CPDR: 40.0000 mg | DELAYED_RELEASE_CAPSULE | Freq: Every day | ORAL | 3 refills | Status: DC

## 2021-08-14 ENCOUNTER — Other Ambulatory Visit (HOSPITAL_COMMUNITY): Payer: Self-pay

## 2021-08-19 ENCOUNTER — Other Ambulatory Visit (HOSPITAL_COMMUNITY): Payer: Self-pay

## 2021-08-24 ENCOUNTER — Other Ambulatory Visit: Payer: Self-pay | Admitting: Pulmonary Disease

## 2021-09-04 ENCOUNTER — Other Ambulatory Visit: Payer: Self-pay | Admitting: Family Medicine

## 2021-09-19 ENCOUNTER — Other Ambulatory Visit: Payer: Self-pay | Admitting: Pulmonary Disease

## 2021-09-19 DIAGNOSIS — R918 Other nonspecific abnormal finding of lung field: Secondary | ICD-10-CM

## 2021-09-19 NOTE — Progress Notes (Signed)
Orders for yearly CT scan. ?

## 2021-09-22 ENCOUNTER — Encounter: Payer: Self-pay | Admitting: Pulmonary Disease

## 2021-09-22 ENCOUNTER — Ambulatory Visit (INDEPENDENT_AMBULATORY_CARE_PROVIDER_SITE_OTHER): Payer: 59 | Admitting: Pulmonary Disease

## 2021-09-22 VITALS — BP 122/76 | HR 86 | Temp 98.0°F | Ht 64.0 in | Wt 188.0 lb

## 2021-09-22 DIAGNOSIS — R918 Other nonspecific abnormal finding of lung field: Secondary | ICD-10-CM

## 2021-09-22 DIAGNOSIS — J410 Simple chronic bronchitis: Secondary | ICD-10-CM | POA: Diagnosis not present

## 2021-09-22 LAB — PULMONARY FUNCTION TEST
DL/VA % pred: 61 %
DL/VA: 2.57 ml/min/mmHg/L
DLCO cor % pred: 57 %
DLCO cor: 11.29 ml/min/mmHg
DLCO unc % pred: 57 %
DLCO unc: 11.29 ml/min/mmHg
FEF 25-75 Post: 1.53 L/sec
FEF 25-75 Pre: 1.15 L/sec
FEF2575-%Change-Post: 32 %
FEF2575-%Pred-Post: 75 %
FEF2575-%Pred-Pre: 56 %
FEV1-%Change-Post: 6 %
FEV1-%Pred-Post: 83 %
FEV1-%Pred-Pre: 78 %
FEV1-Post: 1.97 L
FEV1-Pre: 1.85 L
FEV1FVC-%Change-Post: 3 %
FEV1FVC-%Pred-Pre: 92 %
FEV6-%Change-Post: 2 %
FEV6-%Pred-Post: 90 %
FEV6-%Pred-Pre: 87 %
FEV6-Post: 2.68 L
FEV6-Pre: 2.6 L
FEV6FVC-%Change-Post: 0 %
FEV6FVC-%Pred-Post: 104 %
FEV6FVC-%Pred-Pre: 103 %
FVC-%Change-Post: 2 %
FVC-%Pred-Post: 86 %
FVC-%Pred-Pre: 84 %
FVC-Post: 2.69 L
FVC-Pre: 2.61 L
Post FEV1/FVC ratio: 73 %
Post FEV6/FVC ratio: 100 %
Pre FEV1/FVC ratio: 71 %
Pre FEV6/FVC Ratio: 100 %
RV % pred: 99 %
RV: 2.13 L
TLC % pred: 95 %
TLC: 4.82 L

## 2021-09-22 NOTE — Patient Instructions (Signed)
Continue Trelegy 100 ? ?I will see you back in 6 months ? ?Graded exercise as tolerated ?

## 2021-09-22 NOTE — Progress Notes (Signed)
PFT done today. 

## 2021-09-22 NOTE — Progress Notes (Signed)
? ?      ?Jamie Pennington    [id number]    1956-02-04 ? ?Primary Care Physician:Chambliss, Jeb Levering, MD ? ?Referring Physician: Lind Covert, MD ?[address] ?[place],  [place] [address] ? ?Chief complaint:   ?Follow-up for COPD ? ?HPI:  ?No significant complaints ? ?Gets frustrated with shortness of breath ? ?Hardly ever needs albuterol ? ?Compliant with Trelegy ? ?Activity level is somewhat limited ?She does have occasional nonproductive cough ?No chest pain or chest discomfort ?Occasional nonproductive cough, no chest pain or chest discomfort ? ?CT scan with multiple nodules-stable from 2000 ? ?History of COPD ?Currently on Trelegy, albuterol as needed-continues to benefit from it ?Quit smoking in 1999 ?Denies any chest pains or chest discomfort ?Shortness of breath with moderate exertion ?No pertinent occupational history ? ?Outpatient Encounter Medications as of 09/22/2021  ?Medication Sig  ? acetaminophen (TYLENOL) 500 MG tablet Take 1,000 mg by mouth every 6 (six) hours as needed for headache.  ? albuterol (VENTOLIN HFA) 108 (90 Base) MCG/ACT inhaler Inhale 2 puffs into the lungs as needed (Wheezing).  ? amLODipine (NORVASC) 5 MG tablet TAKE 1 TABLET DAILY  ? aspirin 81 MG tablet Take 81 mg by mouth daily. Reported on 11/27/2015  ? atorvastatin (LIPITOR) 40 MG tablet TAKE 1 TABLET(40 MG) BY MOUTH DAILY.  ? ezetimibe (ZETIA) 10 MG tablet TAKE 1 TABLET(10 MG) BY MOUTH DAILY.  ? famotidine (PEPCID) 40 MG tablet TAKE 1 TABLET(40 MG) BY MOUTH DAILY  ? Fluticasone-Umeclidin-Vilant (TRELEGY ELLIPTA) 100-62.5-25 MCG/ACT AEPB Inhale 1 puff into the lungs daily.  ? metoprolol tartrate (LOPRESSOR) 25 MG tablet TAKE 1 TABLET(25 MG) BY MOUTH TWICE DAILY.  ? montelukast (SINGULAIR) 10 MG tablet TAKE 1 TABLET(10 MG) BY MOUTH AT BEDTIME  ? Multiple Vitamin (MULTIVITAMIN) tablet Take 1 tablet by mouth daily.  ? nitroGLYCERIN (NITROSTAT) 0.4 MG SL tablet Place 0.4 mg under the tongue every 5 (five) minutes  as needed for chest pain (MAX 3 TABLETS).  ? omeprazole (PRILOSEC) 40 MG capsule Take 1 capsule (40 mg total) by mouth daily.  ? [DISCONTINUED] Fluticasone-Umeclidin-Vilant (TRELEGY ELLIPTA) 200-62.5-25 MCG/ACT AEPB Inhale 1 puff into the lungs daily. (Patient not taking: Reported on 09/22/2021)  ? ?No facility-administered encounter medications on file as of 09/22/2021.  ? ? ?Allergies as of 09/22/2021  ? (No Known Allergies)  ? ? ?Past Medical History:  ?Diagnosis Date  ? COPD (chronic obstructive pulmonary disease) ([place])   ? Coronary artery disease   ? GERD (gastroesophageal reflux disease)   ? Headache   ? "probably weekly" (02/24/2018)  ? Hepatitis 1972  ? "don't remember what kind" (02/24/2018)  ? Hyperlipidemia   ? Hypertension   ? Myocardial infarct [facility]) 2004  ? s/p stent placement   ? Pneumonia   ? "once" (02/24/2018)  ? ? ?Past Surgical History:  ?Procedure Laterality Date  ? AUGMENTATION MAMMAPLASTY Bilateral ~ 2008  ? implants  ? BREAST BIOPSY Right   ? "negative"  ? CARDIAC CATHETERIZATION N/A 11/28/2014  ? Procedure: Left Heart Cath and Coronary Angiography;  Surgeon: Troy Sine, MD;  Location: [place] CV LAB;  Service: Cardiovascular;  Laterality: N/A;  ? CORONARY ANGIOPLASTY WITH STENT PLACEMENT  2004  ? with stent  placement to the right coronary.  ? Cotton Osteotomy with Bone Graft Right 11/24/2012  ? @ [facility]  ? KNEE ARTHROSCOPY Left 2010  ? Plantar Endoscopic Fasciotomy Right 11/24/2012  ? @ [facility]  ? ? ?Family History  ?Problem Relation  Age of Onset  ? Stroke Father 31  ?     died  ? Alzheimer's disease Mother 66  ?     died  ? Heart attack Sister   ?     and bypass surgery  ? ? ?Social History  ? ?Socioeconomic History  ? Marital status: Divorced  ?  Spouse name: Not on file  ? Number of children: Not on file  ? Years of education: Not on file  ? Highest education level: Not on file  ?Occupational History  ? Not on file  ?Tobacco Use  ? Smoking status: Former  ?  Packs/day: 1.50  ?  Years: 20.00   ?  Pack years: 30.00  ?  Types: Cigarettes  ?  Start date: 07/24/1971  ?  Quit date: 01/18/1998  ?  Years since quitting: 23.6  ? Smokeless tobacco: Never  ?Vaping Use  ? Vaping Use: Never used  ?Substance and Sexual Activity  ? Alcohol use: Not Currently  ?  Comment: 02/24/2018 "just in my teens"  ? Drug use: Never  ? Sexual activity: Not Currently  ?Other Topics Concern  ? Not on file  ?Social History Narrative  ? Not on file  ? ?Social Determinants of Health  ? ?Financial Resource Strain: Not on file  ?Food Insecurity: Not on file  ?Transportation Needs: Not on file  ?Physical Activity: Not on file  ?Stress: Not on file  ?Social Connections: Not on file  ?Intimate Partner Violence: Not on file  ? ? ?Review of Systems  ?Constitutional: Negative.   ?HENT: Negative.    ?Eyes: Negative.   ?Respiratory:  Positive for cough. Negative for shortness of breath.   ?Cardiovascular: Negative.   ?Gastrointestinal: Negative.   ?Endocrine: Negative.   ?Psychiatric/Behavioral:  Negative for sleep disturbance.   ? ?Vitals:  ? 09/22/21 1338  ?BP: 122/76  ?Pulse: 86  ?Temp: 98 ?F (36.7 ?C)  ?SpO2: 94%  ? ? ? ?Physical Exam ?Constitutional:   ?   Appearance: Normal appearance. She is well-developed.  ?HENT:  ?   Head: Normocephalic and atraumatic.  ?Eyes:  ?   General: No scleral icterus.    ?   Right eye: No discharge.     ?   Left eye: No discharge.  ?Neck:  ?   Thyroid: No thyromegaly.  ?   Trachea: No tracheal deviation.  ?Cardiovascular:  ?   Rate and Rhythm: Normal rate and regular rhythm.  ?Pulmonary:  ?   Effort: Pulmonary effort is normal. No respiratory distress.  ?   Breath sounds: Normal breath sounds. No stridor. No wheezing, rhonchi or rales.  ?Musculoskeletal:  ?   Cervical back: No rigidity or tenderness.  ?Neurological:  ?   Mental Status: She is alert.  ?Psychiatric:     ?   Mood and Affect: Mood normal.  ? ?Data Reviewed: ?Chest x-ray 02/25/2018 shows no acute infiltrate ? ?CT scan of the chest was reviewed with  the patient, noted extensive emphysema, lower lobe lung nodules ? ?Pulmonary function test shows normal spirometry with no significant bronchodilator response, normal lung volumes, moderate reduction in diffusing capacity which is stable from previous ? ?Assessment:  ? ?Obstructive sleep apnea/emphysema ?-We will continue bronchodilators ?-Currently on Trelegy ?-Appears to be tolerating Trelegy well ?-No frequent use of inhalers ? ?Stable lung nodules from 2000 2022 ?-We will plan on repeating CT scan of the chest in about a year ? ?Reformed smoker ? ?Deconditioning ? ?Importance of graded exercises  and weight management discussed with the patient ? ? ?Plan/Recommendations: ? ?Repeat CT at some point to follow-up on lung nodules ? ?Continue Trelegy 100 ? ?Follow-up in 6 months ? ?Encouraged to exercise on a regular basis, graded exercises as tolerated ? ? ?Sherrilyn Rist MD ?[facility] ?09/22/2021, 1:45 PM ? ?CC: Chambliss, Marshall L, * ? ? ?

## 2021-10-05 ENCOUNTER — Other Ambulatory Visit: Payer: Self-pay | Admitting: Physician Assistant

## 2021-10-14 ENCOUNTER — Encounter: Payer: Self-pay | Admitting: *Deleted

## 2021-10-16 ENCOUNTER — Other Ambulatory Visit: Payer: Self-pay | Admitting: Family Medicine

## 2021-10-21 ENCOUNTER — Ambulatory Visit: Payer: 59 | Admitting: Family Medicine

## 2021-10-22 ENCOUNTER — Ambulatory Visit: Payer: 59 | Admitting: Internal Medicine

## 2021-10-28 ENCOUNTER — Ambulatory Visit: Payer: 59 | Admitting: Family Medicine

## 2021-10-30 NOTE — Progress Notes (Signed)
Cardiology Office Note:    Date:  10/31/2021   ID:  Jamie Pennington, DOB [birth year], MRN [id number]  PCP:  Carney Living, MD  [facility] Providers Cardiologist:  Dietrich Pates, MD    Referring MD: Carney Living, *   Chief Complaint:  Follow-up for CAD    Patient Profile: Coronary artery disease  Inf MI in 2004 s/p stent to RCA Cath in 2016: patent RCA stent  Echocardiogram 12/2017: EF 60-65 Hypertension  Hyperlipidemia  Chronic Obstructive Pulmonary Disease  GERD   Prior CV Studies: Echocardiogram 12/31/2017 Moderate concentric LVH, EF 60-65, no RWMA, GR 1 DD, normal RVSF, mild TR, normal PASP  Cardiac catheterization 11/28/2014 LAD normal LCx mid 30 RCA mid stent patent with 20 ISR EF 55-65  Myoview 11/22/2014 Mild anteroapical and inferolateral ischemia, EF 66; low risk  History of Present Illness:   Jamie Pennington is a 66 y.o. female with the above problem list.  She was last seen by Jacolyn Reedy, PA-C in May 2022.  She returns for f/u.  She is here alone.  She remains fairly active at work.  She has to do a lot of walking and lifting.  She has not had chest pain, syncope, orthopnea.  She does have some dependent leg edema.  She has chronic shortness of breath related to COPD without significant change.        Past Medical History:  Diagnosis Date   COPD (chronic obstructive pulmonary disease) (HCC)    Coronary artery disease    GERD (gastroesophageal reflux disease)    Headache    "probably weekly" (02/24/2018)   Hepatitis 1972   "don't remember what kind" (02/24/2018)   Hyperlipidemia    Hypertension    Myocardial infarct (HCC) 2004   s/p stent placement    Pneumonia    "once" (02/24/2018)   Current Medications: Current Meds  Medication Sig   acetaminophen (TYLENOL) 500 MG tablet Take 1,000 mg by mouth every 6 (six) hours as needed for headache.   albuterol (VENTOLIN HFA) 108 (90 Base) MCG/ACT inhaler Inhale 2 puffs into the lungs as needed  (Wheezing).   amLODipine (NORVASC) 5 MG tablet TAKE 1 TABLET BY MOUTH DAILY   aspirin 81 MG tablet Take 81 mg by mouth daily. Reported on 11/27/2015   atorvastatin (LIPITOR) 40 MG tablet TAKE 1 TABLET(40 MG) BY MOUTH DAILY   ezetimibe (ZETIA) 10 MG tablet TAKE 1 TABLET(10 MG) BY MOUTH DAILY   famotidine (PEPCID) 40 MG tablet TAKE 1 TABLET(40 MG) BY MOUTH DAILY   Fluticasone-Umeclidin-Vilant (TRELEGY ELLIPTA) 100-62.5-25 MCG/ACT AEPB Inhale 1 puff into the lungs daily.   metoprolol tartrate (LOPRESSOR) 25 MG tablet TAKE 1 TABLET(25 MG) BY MOUTH TWICE DAILY   montelukast (SINGULAIR) 10 MG tablet TAKE 1 TABLET(10 MG) BY MOUTH AT BEDTIME   Multiple Vitamin (MULTIVITAMIN) tablet Take 1 tablet by mouth daily.   nitroGLYCERIN (NITROSTAT) 0.4 MG SL tablet Place 0.4 mg under the tongue every 5 (five) minutes as needed for chest pain (MAX 3 TABLETS).   omeprazole (PRILOSEC) 40 MG capsule Take 1 capsule (40 mg total) by mouth daily.    Allergies:   Patient has no known allergies.   Social History   Tobacco Use   Smoking status: Former    Packs/day: 1.50    Years: 20.00    Total pack years: 30.00    Types: Cigarettes    Start date: 07/24/1971    Quit date: 01/18/1998    Years since quitting: 23.8  Smokeless tobacco: Never  Vaping Use   Vaping Use: Never used  Substance Use Topics   Alcohol use: Not Currently    Comment: 02/24/2018 "just in my teens"   Drug use: Never    Family Hx: The patient's family history includes Alzheimer's disease (age of onset: 22) in her mother; Heart attack in her sister; Stroke (age of onset: 45) in her father.  Review of Systems  Cardiovascular:  Negative for claudication.  Gastrointestinal:  Negative for hematochezia.  Genitourinary:  Negative for hematuria.     EKGs/Labs/Other Test Reviewed:    EKG:  EKG is  ordered today.  The ekg ordered today demonstrates NSR, HR 66, normal axis, no ST-T wave changes, QTc 431, no change from prior tracing  Recent  Labs: No results found for requested labs within last 365 days.   Recent Lipid Panel No results for input(s): "CHOL", "TRIG", "HDL", "VLDL", "LDLCALC", "LDLDIRECT" in the last 8760 hours.   Risk Assessment/Calculations/Metrics:         HYPERTENSION CONTROL Vitals:   10/31/21 0950 10/31/21 1027  BP: (!) 142/80 (!) 142/90    The patient's blood pressure is elevated above target today.  The following intervention was performed to address the patient's elevated BP:  -        Physical Exam:    VS:  BP (!) 142/90   Pulse 66   Ht 5\' 4"  (1.626 m)   Wt 190 lb 3.2 oz (86.3 kg)   SpO2 91%   BMI 32.65 kg/m     Wt Readings from Last 3 Encounters:  10/31/21 190 lb 3.2 oz (86.3 kg)  09/22/21 188 lb (85.3 kg)  08/05/21 196 lb (88.9 kg)    Constitutional:      Appearance: Healthy appearance. Not in distress.  Neck:     Vascular: No carotid bruit or JVR. JVD normal.  Pulmonary:     Effort: Pulmonary effort is normal.     Breath sounds: No wheezing. No rales.  Cardiovascular:     Normal rate. Regular rhythm. Normal S1. Normal S2.      Murmurs: There is no murmur.  Edema:    Peripheral edema present.    Ankle: bilateral trace edema of the ankle. Abdominal:     Palpations: Abdomen is soft.  Skin:    General: Skin is warm and dry.  Neurological:     Mental Status: Alert and oriented to person, place and time.         ASSESSMENT & PLAN:   CAD (coronary artery disease) Hx of inferior MI in 2004 treated with stent to the RCA. RCA stent patent by cath in 2016.  EF normal by echocardiogram in 2019.  She is doing well without anginal symptoms.  Continue aspirin 81 mg daily, Lipitor 40 mg daily, metoprolol tartrate 25 mg twice daily.  Follow-up in 1 year  Essential hypertension She has not had medications yet today.  Continue amlodipine 5 mg daily, Lopressor 25 mg twice daily.  Monitor blood pressure for 2 weeks and send readings for review.  If blood pressure mains above target, I  will increase her amlodipine to 10 mg daily.  Hyperlipidemia LDL goal <70 Continue atorvastatin 40 mg daily, ezetimibe 10 mg daily.  Obtain CMET, lipids today.  COPD (chronic obstructive pulmonary disease) (HCC) Followed by pulmonology.           Dispo:  Return in about 1 year (around 11/01/2022) for Routine Follow Up, w/ Dr. Tenny Craw.   Medication  Adjustments/Labs and Tests Ordered: Current medicines are reviewed at length with the patient today.  Concerns regarding medicines are outlined above.  Tests Ordered: Orders Placed This Encounter  Procedures   Comp Met (CMET)   Lipid panel   EKG 12-Lead   Medication Changes: No orders of the defined types were placed in this encounter.  Signed, Tereso Newcomer, PA-C  10/31/2021 10:32 AM    [facility] [address] [place], [place], [place]  [address] Phone: [phone]; Fax: [phone]

## 2021-10-31 ENCOUNTER — Ambulatory Visit: Payer: 59 | Admitting: Physician Assistant

## 2021-10-31 ENCOUNTER — Encounter: Payer: Self-pay | Admitting: Physician Assistant

## 2021-10-31 VITALS — BP 142/90 | HR 66 | Ht 64.0 in | Wt 190.2 lb

## 2021-10-31 DIAGNOSIS — I10 Essential (primary) hypertension: Secondary | ICD-10-CM

## 2021-10-31 DIAGNOSIS — J410 Simple chronic bronchitis: Secondary | ICD-10-CM | POA: Diagnosis not present

## 2021-10-31 DIAGNOSIS — I2510 Atherosclerotic heart disease of native coronary artery without angina pectoris: Secondary | ICD-10-CM

## 2021-10-31 DIAGNOSIS — E785 Hyperlipidemia, unspecified: Secondary | ICD-10-CM | POA: Diagnosis not present

## 2021-10-31 LAB — COMPREHENSIVE METABOLIC PANEL
ALT: 28 IU/L (ref 0–32)
AST: 23 IU/L (ref 0–40)
Albumin/Globulin Ratio: 1.7 (ref 1.2–2.2)
Albumin: 4.1 g/dL (ref 3.8–4.8)
Alkaline Phosphatase: 105 IU/L (ref 44–121)
BUN/Creatinine Ratio: 37 — ABNORMAL HIGH (ref 12–28)
BUN: 23 mg/dL (ref 8–27)
Bilirubin Total: 0.4 mg/dL (ref 0.0–1.2)
CO2: 22 mmol/L (ref 20–29)
Calcium: 9.3 mg/dL (ref 8.7–10.3)
Chloride: 104 mmol/L (ref 96–106)
Creatinine, Ser: 0.62 mg/dL (ref 0.57–1.00)
Globulin, Total: 2.4 g/dL (ref 1.5–4.5)
Glucose: 114 mg/dL — ABNORMAL HIGH (ref 70–99)
Potassium: 4.5 mmol/L (ref 3.5–5.2)
Sodium: 139 mmol/L (ref 134–144)
Total Protein: 6.5 g/dL (ref 6.0–8.5)
eGFR: 98 mL/min/{1.73_m2} (ref >59)

## 2021-10-31 LAB — LIPID PANEL
Chol/HDL Ratio: 3 ratio (ref 0.0–4.4)
Cholesterol, Total: 142 mg/dL (ref 100–199)
HDL: 47 mg/dL (ref >39)
LDL Chol Calc (NIH): 77 mg/dL (ref 0–99)
Triglycerides: 97 mg/dL (ref 0–149)
VLDL Cholesterol Cal: 18 mg/dL (ref 5–40)

## 2021-10-31 NOTE — Assessment & Plan Note (Signed)
She has not had medications yet today.  Continue amlodipine 5 mg daily, Lopressor 25 mg twice daily.  Monitor blood pressure for 2 weeks and send readings for review.  If blood pressure mains above target, I will increase her amlodipine to 10 mg daily.

## 2021-10-31 NOTE — Assessment & Plan Note (Signed)
Hx of inferior MI in 2004 treated with stent to the RCA. RCA stent patent by cath in 2016.  EF normal by echocardiogram in 2019.  She is doing well without anginal symptoms.  Continue aspirin 81 mg daily, Lipitor 40 mg daily, metoprolol tartrate 25 mg twice daily.  Follow-up in 1 year

## 2021-10-31 NOTE — Patient Instructions (Signed)
Medication Instructions:  Your physician recommends that you continue on your current medications as directed. Please refer to the Current Medication list given to you today.  *If you need a refill on your cardiac medications before your next appointment, please call your pharmacy*   Lab Work: TODAY:  CMET & LIPID  If you have labs (blood work) drawn today and your tests are completely normal, you will receive your results only by: MyChart Message (if you have MyChart) OR A paper copy in the mail If you have any lab test that is abnormal or we need to change your treatment, we will call you to review the results.   Testing/Procedures: None ordered   Follow-Up: At [facility], you and your health needs are our priority.  As part of our continuing mission to provide you with exceptional heart care, we have created designated Provider Care Teams.  These Care Teams include your primary Cardiologist (physician) and Advanced Practice Providers (APPs -  Physician Assistants and Nurse Practitioners) who all work together to provide you with the care you need, when you need it.  We recommend signing up for the patient portal called "MyChart".  Sign up information is provided on this After Visit Summary.  MyChart is used to connect with patients for Virtual Visits (Telemedicine).  Patients are able to view lab/test results, encounter notes, upcoming appointments, etc.  Non-urgent messages can be sent to your provider as well.   To learn more about what you can do with MyChart, go to [email or web address].    Your next appointment:   1 year(s)  The format for your next appointment:   In Person  Provider:   Dietrich Pates, MD     Other Instructions   Important Information About Sugar

## 2021-10-31 NOTE — Assessment & Plan Note (Signed)
Continue atorvastatin 40 mg daily, ezetimibe 10 mg daily.  Obtain CMET, lipids today.

## 2021-10-31 NOTE — Assessment & Plan Note (Signed)
Followed by pulmonology 

## 2021-11-03 ENCOUNTER — Telehealth: Payer: Self-pay | Admitting: *Deleted

## 2021-11-03 DIAGNOSIS — E785 Hyperlipidemia, unspecified: Secondary | ICD-10-CM

## 2021-11-03 MED ORDER — ATORVASTATIN CALCIUM 80 MG PO TABS: 80.0000 mg | ORAL_TABLET | Freq: Every day | ORAL | 3 refills | Status: DC

## 2021-11-03 NOTE — Telephone Encounter (Signed)
-----   Message from Beatrice Lecher, PA-C sent at 11/03/2021  7:00 AM EDT ----- Creatinine, K+, LFTs normal. LDL 77 but should be < 70.   PLAN:  - Increase Lipitor to 80 mg once daily - Check Lipids, LFTs in 3 mos Tereso Newcomer, [place]    11/03/2021 6:58 AM

## 2021-11-06 ENCOUNTER — Encounter: Payer: Self-pay | Admitting: Family Medicine

## 2021-11-06 MED ORDER — OMEPRAZOLE 40 MG PO CPDR: 40.0000 mg | DELAYED_RELEASE_CAPSULE | Freq: Every day | ORAL | 3 refills | Status: DC

## 2021-11-06 MED ORDER — FAMOTIDINE 40 MG PO TABS: ORAL_TABLET | ORAL | 3 refills | Status: DC

## 2021-11-12 ENCOUNTER — Other Ambulatory Visit: Payer: Self-pay

## 2021-11-12 MED ORDER — METOPROLOL TARTRATE 25 MG PO TABS: 25.0000 mg | ORAL_TABLET | Freq: Two times a day (BID) | ORAL | 3 refills | Status: DC

## 2021-11-12 MED ORDER — AMLODIPINE BESYLATE 5 MG PO TABS: 5.0000 mg | ORAL_TABLET | Freq: Every day | ORAL | 3 refills | Status: DC

## 2021-11-12 MED ORDER — EZETIMIBE 10 MG PO TABS: 10.0000 mg | ORAL_TABLET | Freq: Every day | ORAL | 3 refills | Status: DC

## 2021-11-12 MED ORDER — ATORVASTATIN CALCIUM 80 MG PO TABS: 80.0000 mg | ORAL_TABLET | Freq: Every day | ORAL | 3 refills | Status: DC

## 2021-11-12 NOTE — Addendum Note (Signed)
Addended by: Margaret Pyle D on: 11/12/2021 07:35 AM   Modules accepted: Orders

## 2021-12-11 ENCOUNTER — Other Ambulatory Visit: Payer: Self-pay | Admitting: Pulmonary Disease

## 2022-01-13 ENCOUNTER — Ambulatory Visit: Payer: 59 | Admitting: Family Medicine

## 2022-01-13 DIAGNOSIS — B349 Viral infection, unspecified: Secondary | ICD-10-CM | POA: Insufficient documentation

## 2022-01-13 NOTE — Progress Notes (Signed)
    SUBJECTIVE:   CHIEF COMPLAINT / HPI:   Jamie Pennington is a 66 y.o. female who presents to the [facility] clinic today to discuss the following concerns:   "Head Cold" For the last week. Started with a runny nose- started clear but now yellow. Her head "feels like 50 lbs", previously felt like "150 lbs". She feels like things are slowly improving. She has used her rescue inhaler, using about a couple times a day. Coughing more- no sputum. Intermittent SOB. No fevers. No sick contacts that she is aware of. UTD on her vaccinations. Has been taking Tylenol every 6 hours, also alka-seltzer. Reports last COPD exacerbation was about 2 years ago.   PERTINENT  PMH / PSH: COPD, CAD, HLD, hypertension  OBJECTIVE:   BP 128/85   Pulse 86   Temp (!) 97.5 F (36.4 C)   Ht 5\' 4"  (1.626 m)   Wt 190 lb 6.4 oz (86.4 kg)   SpO2 96%   BMI 32.68 kg/m    General: NAD, pleasant, able to participate in exam HEENT: Normocephalic, MMM, slight tenderness to palpation of maxillary sinus R>L, no nasal drainage Cardiac: RRR Respiratory: CTAB, normal effort, No wheezes, rales or rhonchi Skin: warm and dry, no rashes noted Psych: Normal affect and mood  ASSESSMENT/PLAN:   Viral illness Ongoing symptoms for the last week that seem to be improving with supportive measures. Afebrile, normal lung exam, without focal findings. Though she has a history of COPD, I doubt COPD exacerbation given absence of increased feeding production, respiratory symptoms.  Doubt sinus infection given timeline (less than 10 days).  -Encouraged continued supportive care -Instructed to return if symptoms persist for more than 10 days (without continued improvement), or worsen as this may be indicative of superimposed bacterial infection     , DO [facility]

## 2022-01-13 NOTE — Assessment & Plan Note (Signed)
Ongoing symptoms for the last week that seem to be improving with supportive measures. Afebrile, normal lung exam, without focal findings. Though she has a history of COPD, I doubt COPD exacerbation given absence of increased feeding production, respiratory symptoms.  Doubt sinus infection given timeline (less than 10 days).  -Encouraged continued supportive care -Instructed to return if symptoms persist for more than 10 days (without continued improvement), or worsen as this may be indicative of superimposed bacterial infection

## 2022-01-13 NOTE — Patient Instructions (Addendum)
It was wonderful to see you today.  Please bring ALL of your medications with you to every visit.   Today we talked about:  This is most likely a viral infection. This will take time to get over. The treatment for this is supportive care. You can take Tylenol every 6 hours. You can give a teaspoon of honey by itself or mixed with water to help their cough. Steam baths, Vicks vapor rub, a humidifier and nasal saline spray can help with congestion.   Keep hydrated! Wash hands often.  If you start to feel WORSE please return for re-evaluation as things could progress into a bacterial infection that requires antibiotics.   Reasons to seek immediate medical care: -If you feel worsening shortness of breath -If you have chest pain -If unable to keep any food or fluids down  Thank you for choosing [facility].   Please call [phone] with any questions about today's appointment.  Please be sure to schedule follow up at the front  desk before you leave today.   Sabino Dick, DO PGY-3 Family Medicine

## 2022-02-03 ENCOUNTER — Ambulatory Visit: Payer: 59 | Attending: Physician Assistant

## 2022-02-03 DIAGNOSIS — E785 Hyperlipidemia, unspecified: Secondary | ICD-10-CM

## 2022-02-03 LAB — HEPATIC FUNCTION PANEL
ALT: 27 IU/L (ref 0–32)
AST: 23 IU/L (ref 0–40)
Albumin: 4.3 g/dL (ref 3.9–4.9)
Alkaline Phosphatase: 98 IU/L (ref 44–121)
Bilirubin Total: 0.4 mg/dL (ref 0.0–1.2)
Bilirubin, Direct: 0.12 mg/dL (ref 0.00–0.40)
Total Protein: 6.6 g/dL (ref 6.0–8.5)

## 2022-02-03 LAB — LIPID PANEL
Chol/HDL Ratio: 3 ratio (ref 0.0–4.4)
Cholesterol, Total: 141 mg/dL (ref 100–199)
HDL: 47 mg/dL (ref >39)
LDL Chol Calc (NIH): 72 mg/dL (ref 0–99)
Triglycerides: 126 mg/dL (ref 0–149)
VLDL Cholesterol Cal: 22 mg/dL (ref 5–40)

## 2022-02-04 ENCOUNTER — Telehealth: Payer: Self-pay | Admitting: *Deleted

## 2022-02-04 DIAGNOSIS — E785 Hyperlipidemia, unspecified: Secondary | ICD-10-CM

## 2022-02-04 NOTE — Telephone Encounter (Signed)
-----   Message from Liliane Shi, PA-C sent at 02/04/2022  8:00 AM EDT ----- LDL still above goal despite being on max dose Lipitor and Zetia. LFTs normal.  PLAN:  -Continue current meds -Refer to [facility] - consider PCSK9 inhib Richardson Dopp, PA-C    02/04/2022 7:58 AM

## 2022-02-22 ENCOUNTER — Other Ambulatory Visit: Payer: Self-pay | Admitting: Pulmonary Disease

## 2022-03-09 NOTE — Patient Instructions (Addendum)
Good to see you today - Thank you for coming in  Things we discussed today:  Mirabegron for incontinence - ask your cardiologist if this is ok   Your goal blood pressure is less than 140/90.  Check your blood pressure several times a week.  If regularly higher than this please let your cardiologist know- Next visit please bring in your blood pressure cuff.     Handicap Placard - can do for 6 months for feet to see if these will improve  Continue to exercise and work on weight control  I sent a prescription to your pharmacy for your Zoster vaccines to help prevent Shingles.  The shot may cause a sore arm and mild flu like symptoms for a few days.  You will need a second shot 2 months after your first.    [facility]   Please always bring your medication bottles  Come back to see me in 6-12 months

## 2022-03-09 NOTE — Progress Notes (Signed)
SUBJECTIVE:   CHIEF COMPLAINT / HPI:   Here for check up.   Problems as below  Foot Pain Seeing podiatry with significant pain in both feet due to arthritis.  Can not walk very far due to pain.  She would like the name of a nother podiatrist   Incontinence Losing small amounts of urine frequently progressively for years. She is barely aware.  No burning or frequency or pain or bleeding   GERD Takes PPI daily if does not has symptoms.  Brings in all her medications.   No weight loss or bleeding or food sticking   OBJECTIVE:   BP (!) 155/73   Pulse 70   Wt 194 lb (88 kg)   SpO2 96%   BMI 33.30 kg/m   Heart - Regular rate and rhythm.  No murmurs, gallops or rubs.    Lungs:  Normal respiratory effort, chest expands symmetrically. Lungs are clear to auscultation, no crackles or wheezes. Extremities:  No cyanosis, edema, or deformity noted with good range of motion of all major joints.       ASSESSMENT/PLAN:   Need for immunization against influenza -     Flu Vaccine QUAD High Dose(Fluad)  Pulmonary nodules  Urinary incontinence, unspecified type Assessment & Plan: Consistent with mixed incontinence.  UA normal.  Will trial myrbetriq if ok with cardiology (sent Dr Tenny Craw a message)  Orders: -     POCT urinalysis dipstick -     POCT urinalysis dipstick  Plantar fasciitis of right foot Assessment & Plan: Worsening.  Gave handicap placard for 6 months due to her significant gait disturbance    Gastroesophageal reflux disease, unspecified whether esophagitis present Assessment & Plan: Stable needing daily PPI.  No red flags    Essential hypertension Assessment & Plan: BP Readings from Last 3 Encounters:  03/10/22 (!) 155/73  01/13/22 128/85  10/31/21 (!) 142/90   Elevated today.  Asked her to monitor at home    Other orders -     Zoster Vac Recomb Adjuvanted; Inject 0.5 ml IM and Repeat in 2 months  Dispense: 0.5 mL; Refill: 1   Annual Examination Female  over 33 yo  I reviewed the following patient responses on our Physical Exam Form Tobacco use and candidacy for lung cancer screening - stopped 23 years ago Alcohol Use  Weight  Exercise  Risk for STI  Fall risk Advanced Directive Increased family cancer risk Violence risk  PHQ9 score reviewed  Blood pressure reviewed  I considered the following items based upon USPSTF recommendations: Cervical Cancer if female at birth Mammogram Colon cancer screening Osteoporosis screening HIV testing:  Hepatitis C testing Cholesterol screening STI screening if high risk (Hepatitis B, Syphilis, Gonorrhea, Chlamydia) Immunizations - Influenza, Covid, Shingle, Pneumonia, Tetanus  See After Visit Summary for recommendations   Patient Instructions  Good to see you today - Thank you for coming in  Things we discussed today:  Mirabegron for incontinence - ask your cardiologist if this is ok   Your goal blood pressure is less than 140/90.  Check your blood pressure several times a week.  If regularly higher than this please let your cardiologist know- Next visit please bring in your blood pressure cuff.     Handicap Placard - can do for 6 months for feet to see if these will improve  Continue to exercise and work on weight control  I sent a prescription to your pharmacy for your Zoster vaccines to help prevent Shingles.  The shot may cause a sore arm and mild flu like symptoms for a few days.  You will need a second shot 2 months after your first.    [facility]   Please always bring your medication bottles  Come back to see me in 6-12 months    Carney Living, MD [facility]

## 2022-03-10 ENCOUNTER — Other Ambulatory Visit: Payer: Self-pay

## 2022-03-10 ENCOUNTER — Encounter: Payer: Self-pay | Admitting: Family Medicine

## 2022-03-10 ENCOUNTER — Ambulatory Visit (INDEPENDENT_AMBULATORY_CARE_PROVIDER_SITE_OTHER): Payer: 59 | Admitting: Family Medicine

## 2022-03-10 VITALS — BP 155/73 | HR 70 | Wt 194.0 lb

## 2022-03-10 DIAGNOSIS — R32 Unspecified urinary incontinence: Secondary | ICD-10-CM | POA: Diagnosis not present

## 2022-03-10 DIAGNOSIS — R918 Other nonspecific abnormal finding of lung field: Secondary | ICD-10-CM | POA: Diagnosis not present

## 2022-03-10 DIAGNOSIS — Z23 Encounter for immunization: Secondary | ICD-10-CM

## 2022-03-10 DIAGNOSIS — M722 Plantar fascial fibromatosis: Secondary | ICD-10-CM | POA: Diagnosis not present

## 2022-03-10 DIAGNOSIS — I10 Essential (primary) hypertension: Secondary | ICD-10-CM

## 2022-03-10 DIAGNOSIS — K219 Gastro-esophageal reflux disease without esophagitis: Secondary | ICD-10-CM

## 2022-03-10 LAB — POCT URINALYSIS DIP (MANUAL ENTRY)
Bilirubin, UA: NEGATIVE
Blood, UA: NEGATIVE
Glucose, UA: NEGATIVE mg/dL
Ketones, POC UA: NEGATIVE mg/dL
Leukocytes, UA: NEGATIVE
Nitrite, UA: NEGATIVE
Protein Ur, POC: NEGATIVE mg/dL
Spec Grav, UA: 1.015 (ref 1.010–1.025)
Urobilinogen, UA: 0.2 E.U./dL
pH, UA: 6.5 (ref 5.0–8.0)

## 2022-03-10 MED ORDER — ZOSTER VAC RECOMB ADJUVANTED 50 MCG/0.5ML IM SUSR: INTRAMUSCULAR | 1 refills | Status: DC

## 2022-03-10 NOTE — Assessment & Plan Note (Signed)
Stable needing daily PPI.  No red flags

## 2022-03-10 NOTE — Assessment & Plan Note (Signed)
Consistent with mixed incontinence.  UA normal.  Will trial myrbetriq if ok with cardiology (sent Dr Harrington Challenger a message)

## 2022-03-10 NOTE — Assessment & Plan Note (Signed)
BP Readings from Last 3 Encounters:  03/10/22 (!) 155/73  01/13/22 128/85  10/31/21 (!) 142/90   Elevated today.  Asked her to monitor at home

## 2022-03-10 NOTE — Assessment & Plan Note (Signed)
Worsening.  Gave handicap placard for 6 months due to her significant gait disturbance

## 2022-03-13 ENCOUNTER — Ambulatory Visit: Payer: 59 | Attending: Cardiovascular Disease | Admitting: Student

## 2022-03-13 DIAGNOSIS — E785 Hyperlipidemia, unspecified: Secondary | ICD-10-CM | POA: Diagnosis not present

## 2022-03-13 NOTE — Assessment & Plan Note (Addendum)
Assessment:  Very high risk- LDLc goal <55 mg/dl given hx of MI in 2004 and cardiac catheterization 11/28/2014, age >51  Last LDLc 72 mg/dl (02/03/2022)above the goal.  Current therapy (high intensity statin with ezetimibe) appropriate but insufficient Discuss PCSK9i, nexletol and Leqvio (effectiveness, dose, side effects, cost)  Reasonable to add PCSK9i to achieve the goal LDLc and to reduce future risk  Patient would like to think about it but wants know the cost of PCSK9i with her insurance first   Plan:  Continue taking current medications  atorvastatin 80 mg daily ezetimibe 10 mg daily  Will apply for prior authorization for insurance's preferred PCSK9i and update patient upon approval with estimated monthly cost  Patient to go for lipid lab in 2 moths after starting PCSK9i

## 2022-03-13 NOTE — Progress Notes (Signed)
Patient ID: Jamie Pennington                 DOB: 02-09-1956                    MRN: [id number]      HPI: Jamie Pennington is a 66 y.o. female patient referred to lipid clinic by Richardson Dopp, PA-C and Dr Harrington Challenger. 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 RCA stent patent by cath in 2016.  EF normal by echocardiogram in 2019. In June Lipitor dose was increases from 40 mg daily to 80 mg daily taking ezetimibe 10 mg daily.   Today no acute concern reported by patient. Patient reports taking her mediations regularly and tolerating both well without any side effects denies chest pain, syncope, orthopnea , dizziness. Get SOB but that is from her COPD. Patient reports not doing any regular exercise at home as she works full time and on feet. Mainly eats healthy.  Discussed the LDL goal and importance of attaining the gaol LDLc. Discuss PCSK9i, nexletol and Leqvio (effectiveness, dose, side effects, cost ect.Marland Kitchen)  Patient would like to think about it but wants know the cost of PCSK9i with her insurance first .  Current Medications: atorvastatin 80 mg daily, ezetimibe 10 mg daily  Intolerances: None  Risk Factors: CAD, hypertension, MI in 2004,cardiac catheterization 11/28/2014 LDL goal: <55 mg/dl  Diet: grilled meat, avoids fried food -breakfast- cereals or instant oats. 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 (spaghetti ,chicken casserole)    Exercise:  Does not do exercise, feet gets tired after working 8 hours a day  Family History: mother- alzheimer,Sisters and father - heart disease   Social History:  Alcohol: none Tobacco -quit 23 years ago   Labs: Lipid Panel     Component Value Date/Time   CHOL 141 02/03/2022 0828   TRIG 126 02/03/2022 0828   HDL 47 02/03/2022 0828   CHOLHDL 3.0 02/03/2022  0828   CHOLHDL 3.6 06/20/2015 0849   VLDL 24 06/20/2015 0849   LDLCALC 72 02/03/2022 0828   LDLDIRECT 99 08/02/2012 0917   LABVLDL 22 02/03/2022 0828    Past Medical History:  Diagnosis Date   COPD (chronic obstructive pulmonary disease) ([place])    Coronary artery disease    GERD (gastroesophageal reflux disease)    Headache    "probably weekly" (02/24/2018)   Hepatitis 1972   "don't remember what kind" (02/24/2018)   Hyperlipidemia    Hypertension    Myocardial infarct ([place]) 2004   s/p stent placement    Pneumonia    "once" (02/24/2018)    Current Outpatient Medications on File Prior to Visit  Medication Sig Dispense Refill   acetaminophen (TYLENOL) 500 MG tablet Take 1,000 mg by mouth every 6 (six) hours as needed for headache.     albuterol (VENTOLIN HFA) 108 (90 Base) MCG/ACT inhaler Inhale 2 puffs into the lungs as needed (Wheezing). (Patient not taking: Reported on 03/10/2022) 8 g 6   amLODipine (NORVASC) 5 MG tablet Take 1 tablet (5 mg total) by mouth daily. TAKE 1 TABLET BY MOUTH DAILY 90 tablet 3   aspirin 81 MG tablet Take 81 mg by mouth daily. Reported on 11/27/2015     atorvastatin (LIPITOR) 80 MG tablet Take 1  tablet (80 mg total) by mouth daily. 90 tablet 3   ezetimibe (ZETIA) 10 MG tablet Take 1 tablet (10 mg total) by mouth daily. 90 tablet 3   famotidine (PEPCID) 40 MG tablet TAKE 1 TABLET(40 MG) BY MOUTH DAILY 90 tablet 3   loratadine (CLARITIN) 10 MG tablet Take 10 mg by mouth daily.     metoprolol tartrate (LOPRESSOR) 25 MG tablet Take 1 tablet (25 mg total) by mouth 2 (two) times daily. 180 tablet 3   montelukast (SINGULAIR) 10 MG tablet TAKE 1 TABLET(10 MG) BY MOUTH AT BEDTIME 90 tablet 0   Multiple Vitamin (MULTIVITAMIN) tablet Take 1 tablet by mouth daily.     nitroGLYCERIN (NITROSTAT) 0.4 MG SL tablet Place 0.4 mg under the tongue every 5 (five) minutes as needed for chest pain (MAX 3 TABLETS).     omeprazole (PRILOSEC) 40 MG capsule Take 1 capsule (40 mg  total) by mouth daily. 90 capsule 3   TRELEGY ELLIPTA 100-62.5-25 MCG/ACT AEPB INHALE 1 PUFF INTO THE LUNGS DAILY 60 each 3   Zoster Vaccine Adjuvanted [facility]) injection Inject 0.5 ml IM and Repeat in 2 months 0.5 mL 1   No current facility-administered medications on file prior to visit.    No Known Allergies   Dyslipidemia Assessment:  Very high risk- LDLc goal <55 mg/dl given hx of MI in 4315 and cardiac catheterization 11/28/2014, age >61  Last LDLc 72 mg/dl (40/12/6759)PJKDT the goal.  Current therapy (high intensity statin with ezetimibe) appropriate but insufficient Discuss PCSK9i, nexletol and Leqvio (effectiveness, dose, side effects, cost)  Reasonable to add PCSK9i to achieve the goal LDLc and to reduce future risk  Patient would like to think about it but wants know the cost of PCSK9i with her insurance first   Plan:  Continue taking current medications  atorvastatin 80 mg daily ezetimibe 10 mg daily  Will apply for prior authorization for insurance's preferred PCSK9i and update patient upon approval with estimated monthly cost  Patient to go for lipid lab in 2 moths after starting PCSK9i    Thank you,  Carmela Hurt, Pharm.D [facility] [address] [place], [place] [address]  Phone: [phone]; Fax: [phone]

## 2022-03-13 NOTE — Patient Instructions (Addendum)
Continue taking atorvastatin 80 mg daily, ezetimibe 10 mg daily.  I will submit a prior authorization for Repatha or Praluent which ever is preferred by your insurance . I will call you once I hear back.Please call me at [phone] with any questions.   Repatha is a cholesterol medication that improved your body's ability to get rid of "bad cholesterol" known as LDL. It can lower your LDL up to 60%! It is an injection that is given under the skin every 2 weeks. The medication often requires a prior authorization from your insurance company. We will take care of submitting all the necessary information to your insurance company to get it approved. The most common side effects of Repatha include runny nose, symptoms of the common cold, rarely flu or flu-like symptoms, back/muscle pain in about 3-4% of the patients, and redness, pain, or bruising at the injection site.

## 2022-03-24 ENCOUNTER — Telehealth: Payer: Self-pay | Admitting: Pharmacist

## 2022-03-24 MED ORDER — REPATHA SURECLICK 140 MG/ML ~~LOC~~ SOAJ: 140.0000 mg | SUBCUTANEOUS | 11 refills | Status: DC

## 2022-03-24 NOTE — Telephone Encounter (Signed)
Repatha PA has been approved  EXP: 03/17/2023 (Key: [id number])

## 2022-03-26 ENCOUNTER — Encounter: Payer: Self-pay | Admitting: Pulmonary Disease

## 2022-03-26 ENCOUNTER — Ambulatory Visit (INDEPENDENT_AMBULATORY_CARE_PROVIDER_SITE_OTHER): Payer: 59 | Admitting: Pulmonary Disease

## 2022-03-26 VITALS — BP 138/82 | HR 69 | Temp 98.1°F | Ht 63.0 in | Wt 194.8 lb

## 2022-03-26 DIAGNOSIS — R918 Other nonspecific abnormal finding of lung field: Secondary | ICD-10-CM

## 2022-03-26 DIAGNOSIS — J410 Simple chronic bronchitis: Secondary | ICD-10-CM | POA: Diagnosis not present

## 2022-03-26 MED ORDER — TRELEGY ELLIPTA 100-62.5-25 MCG/ACT IN AEPB: 1.0000 | INHALATION_SPRAY | Freq: Every day | RESPIRATORY_TRACT | 6 refills | Status: DC

## 2022-03-26 NOTE — Patient Instructions (Signed)
Follow-up a year from now  Continue using Trelegy on a daily basis  Rescue albuterol as needed  Graded activities as tolerated

## 2022-03-26 NOTE — Progress Notes (Signed)
Jamie Pennington    WF:[id number]    03/25/1956  Primary Care Physician:Chambliss, Jeb Levering, MD  Referring Physician: Lind Covert, [facility],  [place] [address]  Chief complaint:   Follow-up for COPD  HPI:  Has no significant complaints today Has been relatively stable  Compliant with Trelegy  Hardly ever needs albuterol  Activity level is still limited She does have some discomfort in her feet with being on them for long periods of time  History of an abnormal CT scan with multiple nodules that are stayed stable since 2000  Denies any productive cough, no chest pain or chest discomfort  History of COPD Currently on Trelegy, albuterol as needed-continues to benefit from it Quit smoking in 1999  Shortness of breath with moderate exertion No pertinent occupational history  Outpatient Encounter Medications as of 03/26/2022  Medication Sig   acetaminophen (TYLENOL) 500 MG tablet Take 1,000 mg by mouth every 6 (six) hours as needed for headache.   albuterol (VENTOLIN HFA) 108 (90 Base) MCG/ACT inhaler Inhale 2 puffs into the lungs as needed (Wheezing).   amLODipine (NORVASC) 5 MG tablet Take 1 tablet (5 mg total) by mouth daily. TAKE 1 TABLET BY MOUTH DAILY   aspirin 81 MG tablet Take 81 mg by mouth daily. Reported on 11/27/2015   atorvastatin (LIPITOR) 80 MG tablet Take 1 tablet (80 mg total) by mouth daily.   Evolocumab (REPATHA SURECLICK) [id number] MG/ML SOAJ Inject 140 mg into the skin every 14 (fourteen) days.   ezetimibe (ZETIA) 10 MG tablet Take 1 tablet (10 mg total) by mouth daily.   famotidine (PEPCID) 40 MG tablet TAKE 1 TABLET(40 MG) BY MOUTH DAILY   loratadine (CLARITIN) 10 MG tablet Take 10 mg by mouth daily.   metoprolol tartrate (LOPRESSOR) 25 MG tablet Take 1 tablet (25 mg total) by mouth 2 (two) times daily.   montelukast (SINGULAIR) 10 MG tablet TAKE 1 TABLET(10 MG) BY MOUTH AT BEDTIME   Multiple Vitamin (MULTIVITAMIN)  tablet Take 1 tablet by mouth daily.   nitroGLYCERIN (NITROSTAT) 0.4 MG SL tablet Place 0.4 mg under the tongue every 5 (five) minutes as needed for chest pain (MAX 3 TABLETS).   omeprazole (PRILOSEC) 40 MG capsule Take 1 capsule (40 mg total) by mouth daily.   TRELEGY ELLIPTA 100-62.5-25 MCG/ACT AEPB INHALE 1 PUFF INTO THE LUNGS DAILY   Zoster Vaccine Adjuvanted [facility]) injection Inject 0.5 ml IM and Repeat in 2 months   No facility-administered encounter medications on file as of 03/26/2022.    Allergies as of 03/26/2022   (No Known Allergies)    Past Medical History:  Diagnosis Date   COPD (chronic obstructive pulmonary disease) ([facility])    Coronary artery disease    GERD (gastroesophageal reflux disease)    Headache    "probably weekly" (02/24/2018)   Hepatitis 1972   "don't remember what kind" (02/24/2018)   Hyperlipidemia    Hypertension    Myocardial infarct ([place]) 2004   s/p stent placement    Pneumonia    "once" (02/24/2018)    Past Surgical History:  Procedure Laterality Date   AUGMENTATION MAMMAPLASTY Bilateral ~ 2008   implants   BREAST BIOPSY Right    "negative"   CARDIAC CATHETERIZATION N/A 11/28/2014   Procedure: Left Heart Cath and Coronary Angiography;  Surgeon: Troy Sine, MD;  Location: [facility];  Service: Cardiovascular;  Laterality: N/A;   CORONARY ANGIOPLASTY WITH STENT  PLACEMENT  2004   with stent  placement to the right coronary.   [place] Osteotomy with Bone Graft Right 11/24/2012   @ [place]   KNEE ARTHROSCOPY Left 2010   Plantar Endoscopic Fasciotomy Right 11/24/2012   @ [place]    Family History  Problem Relation Age of Onset   Stroke Father 21       died   Alzheimer's disease Mother 38       died   Heart attack Sister        and bypass surgery    Social History   Socioeconomic History   Marital status: Divorced    Spouse name: Not on file   Number of children: Not on file   Years of education: Not on file   Highest  education level: Not on file  Occupational History   Not on file  Tobacco Use   Smoking status: Former    Packs/day: 1.50    Years: 20.00    Total pack years: 30.00    Types: Cigarettes    Start date: 07/24/1971    Quit date: 01/18/1998    Years since quitting: 24.2   Smokeless tobacco: Never  Vaping Use   Vaping Use: Never used  Substance and Sexual Activity   Alcohol use: Not Currently    Comment: 02/24/2018 "just in my teens"   Drug use: Never   Sexual activity: Not Currently  Other Topics Concern   Not on file  Social History Narrative   Not on file   Social Determinants of Health   Financial Resource Strain: Not on file  Food Insecurity: Not on file  Transportation Needs: Not on file  Physical Activity: Not on file  Stress: Not on file  Social Connections: Not on file  Intimate Partner Violence: Not on file    Review of Systems  Constitutional: Negative.   HENT: Negative.    Eyes: Negative.   Respiratory:  Positive for cough. Negative for shortness of breath.   Cardiovascular: Negative.   Gastrointestinal: Negative.   Endocrine: Negative.   Psychiatric/Behavioral:  Negative for sleep disturbance.     Vitals:   03/26/22 1023  BP: 138/82  Pulse: 69  Temp: 98.1 F (36.7 C)  SpO2: 96%     Physical Exam Constitutional:      Appearance: Normal appearance. She is well-developed.  HENT:     Head: Normocephalic and atraumatic.  Eyes:     General: No scleral icterus.       Right eye: No discharge.        Left eye: No discharge.  Neck:     Thyroid: No thyromegaly.     Trachea: No tracheal deviation.  Cardiovascular:     Rate and Rhythm: Normal rate and regular rhythm.  Pulmonary:     Effort: Pulmonary effort is normal. No respiratory distress.     Breath sounds: Normal breath sounds. No stridor. No wheezing, rhonchi or rales.  Musculoskeletal:     Cervical back: No rigidity or tenderness.  Neurological:     Mental Status: She is alert.   Psychiatric:        Mood and Affect: Mood normal.    Data Reviewed: Chest x-ray 02/25/2018 shows no acute infiltrate  CT scan of the chest was reviewed with the patient, noted extensive emphysema, lower lobe lung nodules  Pulmonary function test is stable from previous  Assessment:   Obstructive sleep apnea/emphysema -Continue Trelegy -Tolerating Trelegy well -Continue rescue inhaler use as needed  Stable lung nodules from 2000 -No need to repeat CT scan every chest at present  Reformed smoker  Deconditioning   Plan/Recommendations:  Continue Trelegy daily  Continue albuterol as needed  Graded exercise as tolerated  Follow-up a year from now  Encouraged to call with significant concerns    Virl Diamond MD [facility] 03/26/2022, 10:30 AM  CC: Carney Living, *

## 2022-03-27 ENCOUNTER — Ambulatory Visit (INDEPENDENT_AMBULATORY_CARE_PROVIDER_SITE_OTHER): Payer: 59 | Admitting: Podiatry

## 2022-03-27 ENCOUNTER — Ambulatory Visit (INDEPENDENT_AMBULATORY_CARE_PROVIDER_SITE_OTHER): Payer: 59

## 2022-03-27 DIAGNOSIS — M79671 Pain in right foot: Secondary | ICD-10-CM

## 2022-03-27 DIAGNOSIS — M79672 Pain in left foot: Secondary | ICD-10-CM

## 2022-03-27 DIAGNOSIS — M778 Other enthesopathies, not elsewhere classified: Secondary | ICD-10-CM

## 2022-03-27 MED ORDER — TRIAMCINOLONE ACETONIDE 10 MG/ML IJ SUSP
20.0000 mg | Freq: Once | INTRAMUSCULAR | Status: AC
  Administered 2022-03-27: 20 mg

## 2022-03-27 MED ORDER — DICLOFENAC SODIUM 75 MG PO TBEC: 75.0000 mg | DELAYED_RELEASE_TABLET | Freq: Two times a day (BID) | ORAL | 2 refills | Status: DC

## 2022-03-28 NOTE — Progress Notes (Signed)
Subjective:   Patient ID: Jamie Pennington, female   DOB: 66 y.o.   MRN: [id number]   HPI Patient presents with chronic pain in the dorsum of both feet stating the right is worse than the left its been going on for years she did have surgery a number years ago for spur formation which was marginally successful and states that she has had previous injections which have not really made a difference for her.  Patient is taking Mobic and utilizes compression stockings.  Patient does not smoke likes to be active   Review of Systems  All other systems reviewed and are negative.       Objective:  Physical Exam Vitals and nursing note reviewed.  Constitutional:      Appearance: She is well-developed.  Pulmonary:     Effort: Pulmonary effort is normal.  Musculoskeletal:        General: Normal range of motion.  Skin:    General: Skin is warm.  Neurological:     Mental Status: She is alert.     Neurovascular status intact muscle strength found to be adequate range of motion adequate.  Patient is noted to have exquisite tenderness on the dorsal lateral aspect of both feet around the midtarsal joint and has enlargement of the first metatarsal cuneiform joint right with slight redness.  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

## 2022-03-31 ENCOUNTER — Other Ambulatory Visit: Payer: Self-pay | Admitting: Podiatry

## 2022-03-31 DIAGNOSIS — M778 Other enthesopathies, not elsewhere classified: Secondary | ICD-10-CM

## 2022-04-07 ENCOUNTER — Other Ambulatory Visit: Payer: Self-pay | Admitting: Pulmonary Disease

## 2022-05-27 ENCOUNTER — Ambulatory Visit (INDEPENDENT_AMBULATORY_CARE_PROVIDER_SITE_OTHER): Payer: 59 | Admitting: Podiatry

## 2022-05-27 ENCOUNTER — Encounter: Payer: Self-pay | Admitting: Podiatry

## 2022-05-27 DIAGNOSIS — M778 Other enthesopathies, not elsewhere classified: Secondary | ICD-10-CM

## 2022-05-27 DIAGNOSIS — M779 Enthesopathy, unspecified: Secondary | ICD-10-CM

## 2022-05-27 NOTE — Progress Notes (Signed)
Subjective:   Patient ID: Jamie Pennington, female   DOB: 67 y.o.   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  Patient to be seen back as needed

## 2022-05-28 ENCOUNTER — Ambulatory Visit (INDEPENDENT_AMBULATORY_CARE_PROVIDER_SITE_OTHER): Payer: 59 | Admitting: Family Medicine

## 2022-05-28 VITALS — BP 162/68 | HR 82 | Wt 190.2 lb

## 2022-05-28 DIAGNOSIS — J441 Chronic obstructive pulmonary disease with (acute) exacerbation: Secondary | ICD-10-CM | POA: Diagnosis not present

## 2022-05-28 MED ORDER — DOXYCYCLINE HYCLATE 100 MG PO TABS: 100.0000 mg | ORAL_TABLET | Freq: Two times a day (BID) | ORAL | 0 refills | Status: AC

## 2022-05-28 MED ORDER — PREDNISONE 20 MG PO TABS: 40.0000 mg | ORAL_TABLET | Freq: Every day | ORAL | 0 refills | Status: AC

## 2022-05-28 MED ORDER — ALBUTEROL SULFATE HFA 108 (90 BASE) MCG/ACT IN AERS: 2.0000 | INHALATION_SPRAY | RESPIRATORY_TRACT | 6 refills | Status: DC | PRN

## 2022-05-28 NOTE — Patient Instructions (Addendum)
Right now sounds like we may be on the verge apparently COPD exacerbation.  I will go ahead and send in steroids and antibiotics for you to take.  The steroids you will take once daily for the next 5 days.  The antibiotics are twice daily for the next 5 days.    I recommend taking antibiotics with food as it can upset your stomach, please let me know if you have any issues with the medications.  If you have further worsening of your symptoms or feel like you are getting severely short of breath either come back into our office or go to the ER for evaluation.  Your blood pressure was elevated in the office, with you being sick it can be higher than usual we want to keep a close eye on this.  I would recommend once you start feeling better to check your blood pressures at least 1-2 times per day and follow-up with your PCP.

## 2022-05-28 NOTE — Progress Notes (Addendum)
    SUBJECTIVE:   CHIEF COMPLAINT / HPI:   Sickness - Cold and minimal congestion symptoms for 5 days - Feels the cough in her chest, is not productive - Denies Fever, N/V/D - Is up to date on vaccinations - Feels like the cough is getting worse and having more fatigue - Using albuterol inhaler 1-2 times per day, wheezing a bit more than usual  HTN - Hasn't been checking recently but feels it was high  PERTINENT  PMH / PSH: HTN, CAD, COPD  OBJECTIVE:   BP (!) 162/68   Pulse 82   Wt 190 lb 4 oz (86.3 kg)   SpO2 96%   BMI 33.70 kg/m   Gen: well-appearing, NAD CV: RRR, no m/r/g appreciated, no peripheral edema Pulm: diffuse wheezing when patient is coughing, minimal rhonchi in the RLL GI: soft, non-tender, non-distended HEENT: TM clear bilaterally, mild pharyngeal erythema, no exudates present, no cervical LAD  ASSESSMENT/PLAN:    COPD with likely exacerbation Patient with worsening cough and wheezing after 5 days of likely prior viral illness.  Given history of COPD and lack of improvement with rescue inhaler, will treat for an exacerbation. - Prednisone 40 mg daily x 5 days - Doxycycline 100 mg twice daily x 5 days - Return/follow-up precautions given  HTN BP elevated during this visit, patient is currently acutely ill so feel that elevations are expected.  Discussed with patient that she should check her blood pressures at home 1-2 times per day once she has recovered from the illness. - Follow-up with PCP  Rise Patience, [place]

## 2022-06-04 ENCOUNTER — Encounter: Payer: Self-pay | Admitting: Adult Health

## 2022-06-04 ENCOUNTER — Ambulatory Visit (INDEPENDENT_AMBULATORY_CARE_PROVIDER_SITE_OTHER): Payer: 59 | Admitting: Adult Health

## 2022-06-04 ENCOUNTER — Ambulatory Visit (INDEPENDENT_AMBULATORY_CARE_PROVIDER_SITE_OTHER): Payer: 59

## 2022-06-04 VITALS — BP 138/90 | HR 71 | Temp 97.9°F | Ht 63.0 in | Wt 190.0 lb

## 2022-06-04 DIAGNOSIS — J441 Chronic obstructive pulmonary disease with (acute) exacerbation: Secondary | ICD-10-CM | POA: Diagnosis not present

## 2022-06-04 DIAGNOSIS — R918 Other nonspecific abnormal finding of lung field: Secondary | ICD-10-CM

## 2022-06-04 MED ORDER — PREDNISONE 10 MG PO TABS: ORAL_TABLET | ORAL | 0 refills | Status: DC

## 2022-06-04 MED ORDER — BENZONATATE 200 MG PO CAPS: 200.0000 mg | ORAL_CAPSULE | Freq: Three times a day (TID) | ORAL | 1 refills | Status: DC | PRN

## 2022-06-04 MED ORDER — AMOXICILLIN-POT CLAVULANATE 875-125 MG PO TABS: 1.0000 | ORAL_TABLET | Freq: Two times a day (BID) | ORAL | 0 refills | Status: DC

## 2022-06-04 NOTE — Progress Notes (Signed)
@Patient  ID: , female    DOB: 17-Jan-1956, 67 y.o.   MRN: [id number]  Chief Complaint  Patient presents with   Acute Visit    Referring provider: [id number], *  HPI: 67 year old female former smoker followed for COPD with emphysema, chronic bronchitis  TEST/EVENTS :   06/04/2022 Acute office visit : Cough, COPD  Patient presents for an acute office visit.  She complains over the last week that she has had increased cough, congestion, wheezing and shortness of breath.  She was seen by her primary care provider initially and given doxycycline and a prednisone burst for 5 days.  Patient says her symptoms are not improved at all.  She continues to have ongoing cough and wheezing.  Increased albuterol use.  COVID testing at home has been negative.  She has no fever, hemoptysis, chest pain, orthopnea, edema.  Appetite is okay with no nausea vomiting or diarrhea.  She has been using over-the-counter cough medicines without much relief.  Cough is very frustrating and keeping her up at night. Chest x-ray today shows clear lungs .   No Known Allergies  Immunization History  Administered Date(s) Administered   Fluad Quad(high Dose 65+) 03/10/2022   Influenza Split 02/11/2011   Influenza Whole 02/21/2007   Influenza,inj,Quad PF,6+ Mos 05/28/2015, 02/12/2016, 06/16/2017, 03/04/2018, 03/20/2019, 03/05/2020, 03/15/2021   Influenza-Unspecified 04/10/2014   Moderna Sars-Covid-2 Vaccination 02/04/2022   PFIZER(Purple Top)SARS-COV-2 Vaccination 02/23/2020, 04/09/2020   Pneumococcal Conjugate-13 05/28/2015   Pneumococcal Polysaccharide-23 07/05/2019   Td 02/08/1997   Td (Adult) 02/08/1997    Past Medical History:  Diagnosis Date   COPD (chronic obstructive pulmonary disease) (HCC)    Coronary artery disease    GERD (gastroesophageal reflux disease)    Headache    "probably weekly" (02/24/2018)   Hepatitis 1972   "don't remember what kind" (02/24/2018)   Hyperlipidemia     Hypertension    Myocardial infarct (HCC) 2004   s/p stent placement    Pneumonia    "once" (02/24/2018)    Tobacco History: Social History   Tobacco Use  Smoking Status Former   Packs/day: 1.50   Years: 20.00   Total pack years: 30.00   Types: Cigarettes   Start date: 07/24/1971   Quit date: 01/18/1998   Years since quitting: 24.3  Smokeless Tobacco Never   Counseling given: Not Answered   Outpatient Medications Prior to Visit  Medication Sig Dispense Refill   acetaminophen (TYLENOL) 500 MG tablet Take 1,000 mg by mouth every 6 (six) hours as needed for headache.     albuterol (VENTOLIN HFA) 108 (90 Base) MCG/ACT inhaler Inhale 2 puffs into the lungs as needed (Wheezing). 8 g 6   amLODipine (NORVASC) 5 MG tablet Take 1 tablet (5 mg total) by mouth daily. TAKE 1 TABLET BY MOUTH DAILY 90 tablet 3   aspirin 81 MG tablet Take 81 mg by mouth daily. Reported on 11/27/2015     atorvastatin (LIPITOR) 80 MG tablet Take 1 tablet (80 mg total) by mouth daily. 90 tablet 3   diclofenac (VOLTAREN) 75 MG EC tablet Take 1 tablet (75 mg total) by mouth 2 (two) times daily. 50 tablet 2   Evolocumab (REPATHA SURECLICK) 140 MG/ML SOAJ Inject 140 mg into the skin every 14 (fourteen) days. 2 mL 11   ezetimibe (ZETIA) 10 MG tablet Take 1 tablet (10 mg total) by mouth daily. 90 tablet 3   famotidine (PEPCID) 40 MG tablet TAKE 1 TABLET(40 MG) BY MOUTH DAILY 90  tablet 3   Fluticasone-Umeclidin-Vilant (TRELEGY ELLIPTA) 100-62.5-25 MCG/ACT AEPB Inhale 1 puff into the lungs daily. 60 each 6   loratadine (CLARITIN) 10 MG tablet Take 10 mg by mouth daily.     metoprolol tartrate (LOPRESSOR) 25 MG tablet Take 1 tablet (25 mg total) by mouth 2 (two) times daily. 180 tablet 3   montelukast (SINGULAIR) 10 MG tablet TAKE 1 TABLET(10 MG) BY MOUTH AT BEDTIME 90 tablet 0   Multiple Vitamin (MULTIVITAMIN) tablet Take 1 tablet by mouth daily.     nitroGLYCERIN (NITROSTAT) 0.4 MG SL tablet Place 0.4 mg under the  tongue every 5 (five) minutes as needed for chest pain (MAX 3 TABLETS).     omeprazole (PRILOSEC) 40 MG capsule Take 1 capsule (40 mg total) by mouth daily. 90 capsule 3   Zoster Vaccine Adjuvanted [facility]) injection Inject 0.5 ml IM and Repeat in 2 months (Patient not taking: Reported on 06/04/2022) 0.5 mL 1   No facility-administered medications prior to visit.     Review of Systems:   Constitutional:   No  weight loss, night sweats,  Fevers, chills, fatigue, or  lassitude.  HEENT:   No headaches,  Difficulty swallowing,  Tooth/dental problems, or  Sore throat,                No sneezing, itching, ear ache,  +nasal congestion, post nasal drip,   CV:  No chest pain,  Orthopnea, PND, swelling in lower extremities, anasarca, dizziness, palpitations, syncope.   GI  No heartburn, indigestion, abdominal pain, nausea, vomiting, diarrhea, change in bowel habits, loss of appetite, bloody stools.   Resp:   No chest wall deformity  Skin: no rash or lesions.  GU: no dysuria, change in color of urine, no urgency or frequency.  No flank pain, no hematuria   MS:  No joint pain or swelling.  No decreased range of motion.  No back pain.    Physical Exam  BP (!) 138/90 (BP Location: Right Arm, Cuff Size: Normal)   Pulse 71   Temp 97.9 F (36.6 C)   Ht 5\' 3"  (1.6 m)   Wt 190 lb (86.2 kg)   SpO2 97%   BMI 33.66 kg/m   GEN: A/Ox3; pleasant , NAD, well nourished    HEENT:  /AT,  NOSE-clear, THROAT-clear, no lesions, no postnasal drip or exudate noted.   NECK:  Supple w/ fair ROM; no JVD; normal carotid impulses w/o bruits; no thyromegaly or nodules palpated; no lymphadenopathy.    RESP  Clear  P & A; w/o, wheezes/ rales/ or rhonchi. no accessory muscle use, no dullness to percussion  CARD:  RRR, no m/r/g, no peripheral edema, pulses intact, no cyanosis or clubbing.  GI:   Soft & nt; nml bowel sounds; no organomegaly or masses detected.   Musco: Warm bil, no deformities or joint  swelling noted.   Neuro: alert, no focal deficits noted.    Skin: Warm, no lesions or rashes    Lab Results:  CBC   BMET  No results found for: "BNP"  ProBNP  Imaging: DG Chest 2 View  Result Date: 06/04/2022 CLINICAL DATA:  COPD exacerbation. EXAM: CHEST - 2 VIEW COMPARISON:  January 23, 2020. FINDINGS: The heart size and mediastinal contours are within normal limits. Both lungs are clear. The visualized skeletal structures are unremarkable. IMPRESSION: No active cardiopulmonary disease. Electronically Signed   By: Marijo Conception M.D.   On: 06/04/2022 08:58  Latest Ref Rng & Units 09/22/2021   11:56 AM 11/02/2018    8:29 AM  PFT Results  FVC-Pre L 2.61  2.38   FVC-Predicted Pre % 84  74   FVC-Post L 2.69  2.52   FVC-Predicted Post % 86  78   Pre FEV1/FVC % % 71  65   Post FEV1/FCV % % 73  68   FEV1-Pre L 1.85  1.56   FEV1-Predicted Pre % 78  63   FEV1-Post L 1.97  1.72   DLCO uncorrected ml/min/mmHg 11.29  12.08   DLCO UNC% % 57  60   DLCO corrected ml/min/mmHg 11.29    DLCO COR %Predicted % 57    DLVA Predicted % 61  70   TLC L 4.82  4.91   TLC % Predicted % 95  97   RV % Predicted % 99  113     No results found for: "NITRICOXIDE"      Assessment & Plan:   COPD (chronic obstructive pulmonary disease) (HCC) Slow to resolve COPD exacerbation.  Chest x-ray today is clear.     Rexene Edison, NP 06/04/2022

## 2022-06-04 NOTE — Assessment & Plan Note (Signed)
Small bilateral pulmonary nodules patient is due for her serial CT chest in March 2024.

## 2022-06-04 NOTE — Assessment & Plan Note (Addendum)
Slow to resolve COPD exacerbation.  Chest x-ray today is clear. Will treat with empiric antibiotics and a steroid taper.  Was given albuterol nebulizer treatment in office.  Add in cough control with Delsym and Tessalon Perles.  Plan  Patient Instructions  Augmentin 875mg  Twice daily  for 1 week, take with food  Prednisone taper over next week  Mucinex Twice daily  As needed  cough/congestion  Delsym 2 tsp Twice daily  As needed  cough  Tessalon Three times a day  As needed  cough  Continue on Trelegy 1 puff daily  Albuterol inhaler As needed   CT chest in March as planned  Follow up with Dr. Ander Slade in 6-8 weeks and As needed   Please contact office for sooner follow up if symptoms do not improve or worsen or seek emergency care

## 2022-06-04 NOTE — Patient Instructions (Addendum)
Augmentin 875mg  Twice daily  for 1 week, take with food  Prednisone taper over next week  Mucinex Twice daily  As needed  cough/congestion  Delsym 2 tsp Twice daily  As needed  cough  Tessalon Three times a day  As needed  cough  Continue on Trelegy 1 puff daily  Albuterol inhaler As needed   CT chest in March as planned  Follow up with Dr. Ander Slade in 6-8 weeks and As needed   Please contact office for sooner follow up if symptoms do not improve or worsen or seek emergency care

## 2022-06-08 ENCOUNTER — Other Ambulatory Visit: Payer: Self-pay | Admitting: Podiatry

## 2022-06-26 ENCOUNTER — Telehealth: Payer: Self-pay | Admitting: Internal Medicine

## 2022-06-26 DIAGNOSIS — E785 Hyperlipidemia, unspecified: Secondary | ICD-10-CM

## 2022-06-26 NOTE — Telephone Encounter (Signed)
Pt is requesting to speak to someone about ordering labs to check her levels after taking the new medication.

## 2022-06-29 NOTE — Telephone Encounter (Signed)
Patient reports she is unable to tolerate Repatha - has been experiencing common cold and flu like symptoms every time she takes the dose. If she takes it int he am she gets flu like symptoms in the evening and that last for a day.  Will be going for follow up lab on Feb 20. May consider changing Repatha to [place].

## 2022-06-30 ENCOUNTER — Ambulatory Visit: Payer: 59 | Attending: Internal Medicine

## 2022-06-30 DIAGNOSIS — E785 Hyperlipidemia, unspecified: Secondary | ICD-10-CM

## 2022-06-30 LAB — LIPID PANEL
Chol/HDL Ratio: 2.8 ratio (ref 0.0–4.4)
Cholesterol, Total: 113 mg/dL (ref 100–199)
HDL: 40 mg/dL (ref >39)
LDL Chol Calc (NIH): 50 mg/dL (ref 0–99)
Triglycerides: 133 mg/dL (ref 0–149)
VLDL Cholesterol Cal: 23 mg/dL (ref 5–40)

## 2022-07-03 ENCOUNTER — Other Ambulatory Visit (HOSPITAL_COMMUNITY): Payer: Self-pay

## 2022-07-03 NOTE — Telephone Encounter (Signed)
Spoke to patient today, patient reports she took her Repatha injection on Feb 21 she did not experienced cold of flue like symptoms this time. So will continue using it for now and if flue like symptoms return she will inform us and we may try Praluent.

## 2022-07-07 ENCOUNTER — Encounter: Payer: Self-pay | Admitting: Pulmonary Disease

## 2022-07-13 ENCOUNTER — Inpatient Hospital Stay: Admission: RE | Admit: 2022-07-13 | Payer: 59 | Source: Ambulatory Visit

## 2022-07-20 ENCOUNTER — Other Ambulatory Visit: Payer: Self-pay | Admitting: Pulmonary Disease

## 2022-07-23 ENCOUNTER — Other Ambulatory Visit: Payer: Self-pay | Admitting: Pulmonary Disease

## 2022-07-23 ENCOUNTER — Ambulatory Visit (INDEPENDENT_AMBULATORY_CARE_PROVIDER_SITE_OTHER): Payer: 59 | Admitting: Pulmonary Disease

## 2022-07-23 ENCOUNTER — Encounter: Payer: Self-pay | Admitting: Pulmonary Disease

## 2022-07-23 VITALS — BP 138/82 | HR 66 | Ht 63.0 in | Wt 192.6 lb

## 2022-07-23 DIAGNOSIS — R918 Other nonspecific abnormal finding of lung field: Secondary | ICD-10-CM | POA: Diagnosis not present

## 2022-07-23 MED ORDER — BENZONATATE 200 MG PO CAPS: 200.0000 mg | ORAL_CAPSULE | Freq: Three times a day (TID) | ORAL | 1 refills | Status: AC | PRN

## 2022-07-23 NOTE — Progress Notes (Signed)
Jamie Pennington    [id number]    09/15/1955  Primary Care Physician:Chambliss, Jeb Levering, MD  Referring Physician: Lind Covert, [address],  [place] [address]  Chief complaint:   Follow-up for COPD  HPI:  No significant change in status Continues to be compliant with Trelegy  She wakes up in the morning feeling a little short of breath and continues to cough Overall, breathing feels good  Denies any significant pain or discomfort  She is able to maintain her activity levels  History of an abnormal CT scan with multiple nodules that are stayed stable since 2000  Denies any productive cough, no chest pain or chest discomfort  History of COPD Currently on Trelegy, albuterol as needed-continues to benefit from it Quit smoking in 1999  Shortness of breath with moderate exertion No pertinent occupational history  She stated she did not notice any significant change with her breathing with the use of Trelegy  Outpatient Encounter Medications as of 07/23/2022  Medication Sig   acetaminophen (TYLENOL) 500 MG tablet Take 1,000 mg by mouth every 6 (six) hours as needed for headache.   albuterol (VENTOLIN HFA) 108 (90 Base) MCG/ACT inhaler Inhale 2 puffs into the lungs as needed (Wheezing).   amLODipine (NORVASC) 5 MG tablet Take 1 tablet (5 mg total) by mouth daily. TAKE 1 TABLET BY MOUTH DAILY   aspirin 81 MG tablet Take 81 mg by mouth daily. Reported on 11/27/2015   atorvastatin (LIPITOR) 80 MG tablet Take 1 tablet (80 mg total) by mouth daily.   benzonatate (TESSALON) 200 MG capsule Take 1 capsule (200 mg total) by mouth 3 (three) times daily as needed.   diclofenac (VOLTAREN) 75 MG EC tablet TAKE 1 TABLET(75 MG) BY MOUTH TWICE DAILY   Evolocumab (REPATHA SURECLICK) [id number] MG/ML SOAJ Inject 140 mg into the skin every 14 (fourteen) days.   ezetimibe (ZETIA) 10 MG tablet Take 1 tablet (10 mg total) by mouth daily.   famotidine (PEPCID) 40 MG  tablet TAKE 1 TABLET(40 MG) BY MOUTH DAILY   Fluticasone-Umeclidin-Vilant (TRELEGY ELLIPTA) 100-62.5-25 MCG/ACT AEPB Inhale 1 puff into the lungs daily.   loratadine (CLARITIN) 10 MG tablet Take 10 mg by mouth daily.   metoprolol tartrate (LOPRESSOR) 25 MG tablet Take 1 tablet (25 mg total) by mouth 2 (two) times daily.   montelukast (SINGULAIR) 10 MG tablet TAKE 1 TABLET(10 MG) BY MOUTH AT BEDTIME   Multiple Vitamin (MULTIVITAMIN) tablet Take 1 tablet by mouth daily.   nitroGLYCERIN (NITROSTAT) 0.4 MG SL tablet Place 0.4 mg under the tongue every 5 (five) minutes as needed for chest pain (MAX 3 TABLETS).   omeprazole (PRILOSEC) 40 MG capsule Take 1 capsule (40 mg total) by mouth daily.   Zoster Vaccine Adjuvanted [facility]) injection Inject 0.5 ml IM and Repeat in 2 months   [DISCONTINUED] amoxicillin-clavulanate (AUGMENTIN) 875-125 MG tablet Take 1 tablet by mouth 2 (two) times daily.   [DISCONTINUED] predniSONE (DELTASONE) 10 MG tablet 4 tabs for 2 days, then 3 tabs for 2 days, 2 tabs for 2 days, then 1 tab for 2 days, then stop   No facility-administered encounter medications on file as of 07/23/2022.    Allergies as of 07/23/2022   (No Known Allergies)    Past Medical History:  Diagnosis Date   COPD (chronic obstructive pulmonary disease) (HCC)    Coronary artery disease    GERD (gastroesophageal reflux disease)    Headache    "  probably weekly" (02/24/2018)   Hepatitis 1972   "don't remember what kind" (02/24/2018)   Hyperlipidemia    Hypertension    Myocardial infarct ([place]) 2004   s/p stent placement    Pneumonia    "once" (02/24/2018)    Past Surgical History:  Procedure Laterality Date   AUGMENTATION MAMMAPLASTY Bilateral ~ 2008   implants   BREAST BIOPSY Right    "negative"   CARDIAC CATHETERIZATION N/A 11/28/2014   Procedure: Left Heart Cath and Coronary Angiography;  Surgeon: Troy Sine, MD;  Location: [facility];  Service: Cardiovascular;  Laterality:  N/A;   CORONARY ANGIOPLASTY WITH STENT PLACEMENT  2004   with stent  placement to the right coronary.   [place] Osteotomy with Bone Graft Right 11/24/2012   @ [place]   KNEE ARTHROSCOPY Left 2010   Plantar Endoscopic Fasciotomy Right 11/24/2012   @ [place]    Family History  Problem Relation Age of Onset   Stroke Father 27       died   Alzheimer's disease Mother 37       died   Heart attack Sister        and bypass surgery    Social History   Socioeconomic History   Marital status: Divorced    Spouse name: Not on file   Number of children: Not on file   Years of education: Not on file   Highest education level: Not on file  Occupational History   Not on file  Tobacco Use   Smoking status: Former    Packs/day: 1.50    Years: 20.00    Additional pack years: 0.00    Total pack years: 30.00    Types: Cigarettes    Start date: 07/24/1971    Quit date: 01/18/1998    Years since quitting: 24.5   Smokeless tobacco: Never  Vaping Use   Vaping Use: Never used  Substance and Sexual Activity   Alcohol use: Not Currently    Comment: 02/24/2018 "just in my teens"   Drug use: Never   Sexual activity: Not Currently  Other Topics Concern   Not on file  Social History Narrative   Not on file   Social Determinants of Health   Financial Resource Strain: Not on file  Food Insecurity: Not on file  Transportation Needs: Not on file  Physical Activity: Not on file  Stress: Not on file  Social Connections: Not on file  Intimate Partner Violence: Not on file    Review of Systems  Constitutional: Negative.   HENT: Negative.    Eyes: Negative.   Respiratory:  Positive for cough. Negative for shortness of breath.   Cardiovascular: Negative.   Gastrointestinal: Negative.   Endocrine: Negative.   Psychiatric/Behavioral:  Negative for sleep disturbance.     Vitals:   07/23/22 0901  BP: 138/82  Pulse: 66  SpO2: 96%     Physical Exam Constitutional:      Appearance: Normal  appearance. She is well-developed.  HENT:     Head: Normocephalic and atraumatic.     Nose: Nose normal.     Mouth/Throat:     Mouth: Mucous membranes are moist.  Eyes:     General: No scleral icterus. Neck:     Thyroid: No thyromegaly.     Trachea: No tracheal deviation.  Cardiovascular:     Rate and Rhythm: Normal rate and regular rhythm.  Pulmonary:     Effort: Pulmonary effort is normal. No respiratory distress.  Breath sounds: Normal breath sounds. No stridor. No wheezing, rhonchi or rales.  Musculoskeletal:     Cervical back: No rigidity or tenderness.  Neurological:     Mental Status: She is alert.  Psychiatric:        Mood and Affect: Mood normal.    Data Reviewed: Chest x-ray 02/25/2018 shows no acute infiltrate  CT scan of the chest was reviewed with the patient, noted extensive emphysema, lower lobe lung nodules  Pulmonary function test is stable from previous PFT 09/22/2021 showing moderate reduction in diffusing capacity  Assessment:   Obstructive lung disease/emphysema -Continue Trelegy -May change the timing from Trelegy from the morning to midday to evening, this may help with the shortness of breath in the morning  Stable lung nodules from 2000 -We did request for a repeat CT scan of her chest to follow-up on lung nodules, there was stable from previous  Continue rescue inhalers as needed  Reformed smoker  Deconditioning -Encouraged to exercise on a regular basis -She states she works actively and does a lot of moving around at work   Plan/Recommendations:  Continue Trelegy  Graded exercise as tolerated  Follow-up 6 months from here  Follow-up CT scan of the chest without contrast  Encouraged to call with any significant symptoms   Sherrilyn Rist MD  Pulmonary and Critical Care 07/23/2022, 9:12 AM  CC: Lind Covert, *

## 2022-07-23 NOTE — Patient Instructions (Signed)
CT scan of the chest without contrast for multiple lung nodules  Continue Trelegy  Try and change the time that you take the Trelegy to see if it helps the coughing in the morning -You can call us after about 3 to 4 weeks of using a different time, if symptoms are unchanged, we can consider going to a higher dose of Trelegy  Will refill the benzonatate  I will see you back in 6 months  Call with any other concerns

## 2022-07-24 ENCOUNTER — Encounter: Payer: Self-pay | Admitting: Pulmonary Disease

## 2022-07-29 ENCOUNTER — Ambulatory Visit: Payer: 59 | Admitting: Podiatry

## 2022-08-07 ENCOUNTER — Telehealth: Payer: Self-pay

## 2022-08-07 NOTE — Telephone Encounter (Signed)
Left message on VM regarding MyChart message from Dr. Ander Slade.  Received notification that patient has not read the message. Informed patient to read her MyChart information or she can call our office for information.   MyChart note was:    07/24/22 10:04 AM   CT scan of the chest was not approved by the insurance   Just to let you know   We probably will only be able to get this if other issues come up   Reassuringly you lung nodules were stable at last check       olalere  This MyChart message has not been read.

## 2022-08-17 ENCOUNTER — Ambulatory Visit (INDEPENDENT_AMBULATORY_CARE_PROVIDER_SITE_OTHER): Payer: 59 | Admitting: Podiatry

## 2022-08-17 ENCOUNTER — Ambulatory Visit (INDEPENDENT_AMBULATORY_CARE_PROVIDER_SITE_OTHER): Payer: 59

## 2022-08-17 ENCOUNTER — Encounter: Payer: Self-pay | Admitting: Podiatry

## 2022-08-17 VITALS — BP 180/85 | HR 67 | Ht 63.0 in | Wt 192.0 lb

## 2022-08-17 DIAGNOSIS — D169 Benign neoplasm of bone and articular cartilage, unspecified: Secondary | ICD-10-CM

## 2022-08-17 DIAGNOSIS — M7751 Other enthesopathy of right foot: Secondary | ICD-10-CM | POA: Diagnosis not present

## 2022-08-17 DIAGNOSIS — M7750 Other enthesopathy of unspecified foot: Secondary | ICD-10-CM

## 2022-08-18 NOTE — Progress Notes (Signed)
Subjective:   Patient ID: Jamie Pennington, female   DOB: 67 y.o.   MRN: [id number]   HPI Patient presents stating she is getting a lot of pain on top of her right foot and it seems to be more where the bumps are than it does to be in the joint even though both still bother her.  States she is now having trouble wearing any form of shoe gear   ROS      Objective:  Physical Exam  Neurovascular status intact with prominence of the first metatarsocuneiform joint and hallux extending into the second metatarsal cuneiform joint with enlargement of the area redness and pain with palpation with moderate discomfort within the joint surfaces themselves     Assessment:  Most likely the majority of the pain is related to the spur formation with also inflammation of the joint surfaces which may be contributory     Plan:  H&P reviewed condition at great length and x-ray and at this point I discussed resecting the bone enlargements and I explained this may or may not solve all problems but I do think it would make a huge difference to the pressure that she is experiencing.  Patient wants surgery and I allowed her to read consent form going over all possible complications and alternative treatments.  She signed consent form understands recovery take several months and since we will be working around the tendon structures I did apply and dispense air fracture walker that I want her to use during the recovery..  Patient will get used to this prior to the surgery all questions answered and surgery scheduled  X-rays indicate large spur of the first metatarsocuneiform joint probable second metatarsal cuneiform joint have grown slightly since previous visit

## 2022-08-19 ENCOUNTER — Other Ambulatory Visit: Payer: Self-pay | Admitting: Podiatry

## 2022-08-20 ENCOUNTER — Other Ambulatory Visit: Payer: 59

## 2022-08-26 IMAGING — DX DG CHEST 2V
2 series · 2 of 2 positions shown · non-contrast
Comparison: 02/25/2018, CT 06/27/2019

CLINICAL DATA: Bronchitis COPD

EXAM:
CHEST - 2 VIEW

[chest pa]
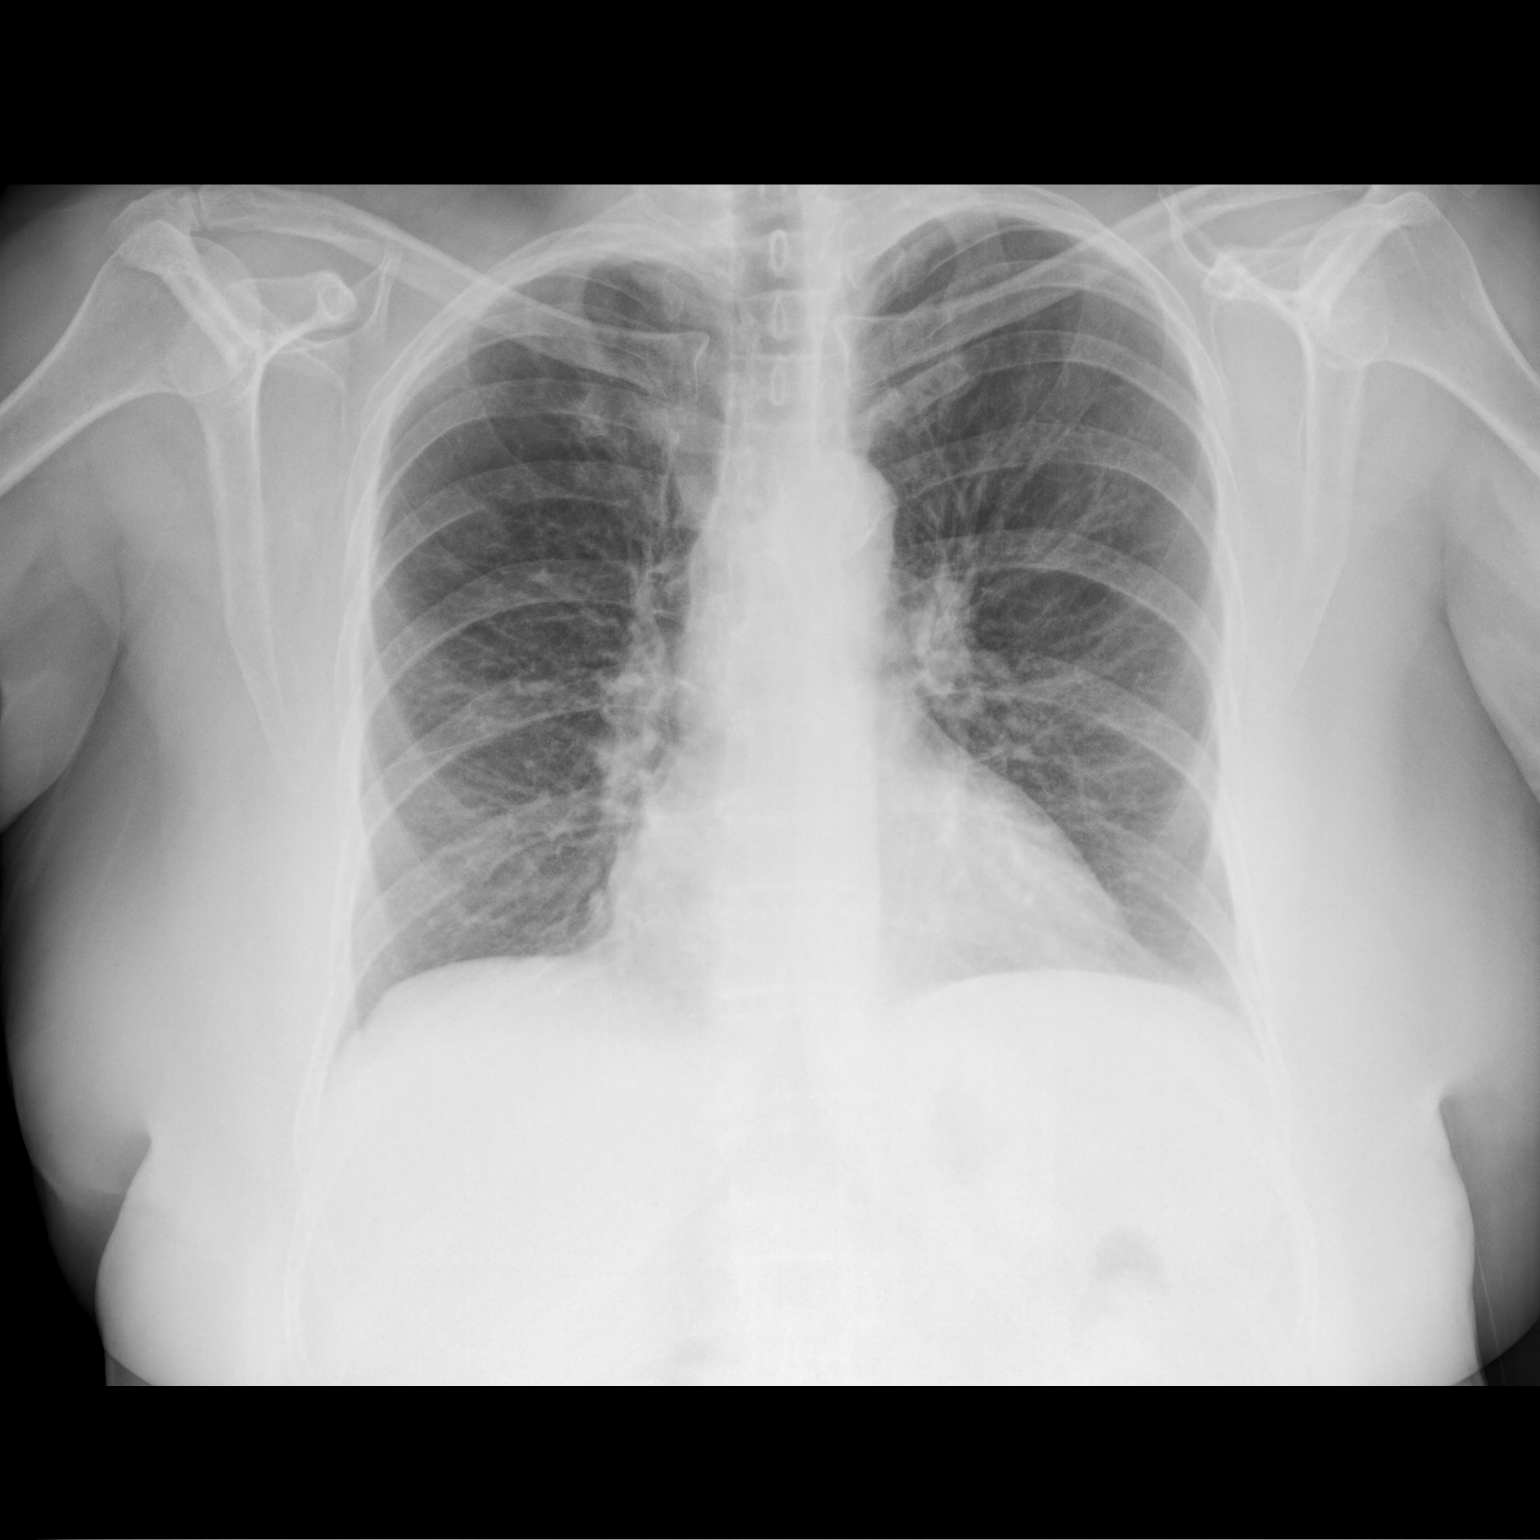

[chest lat]
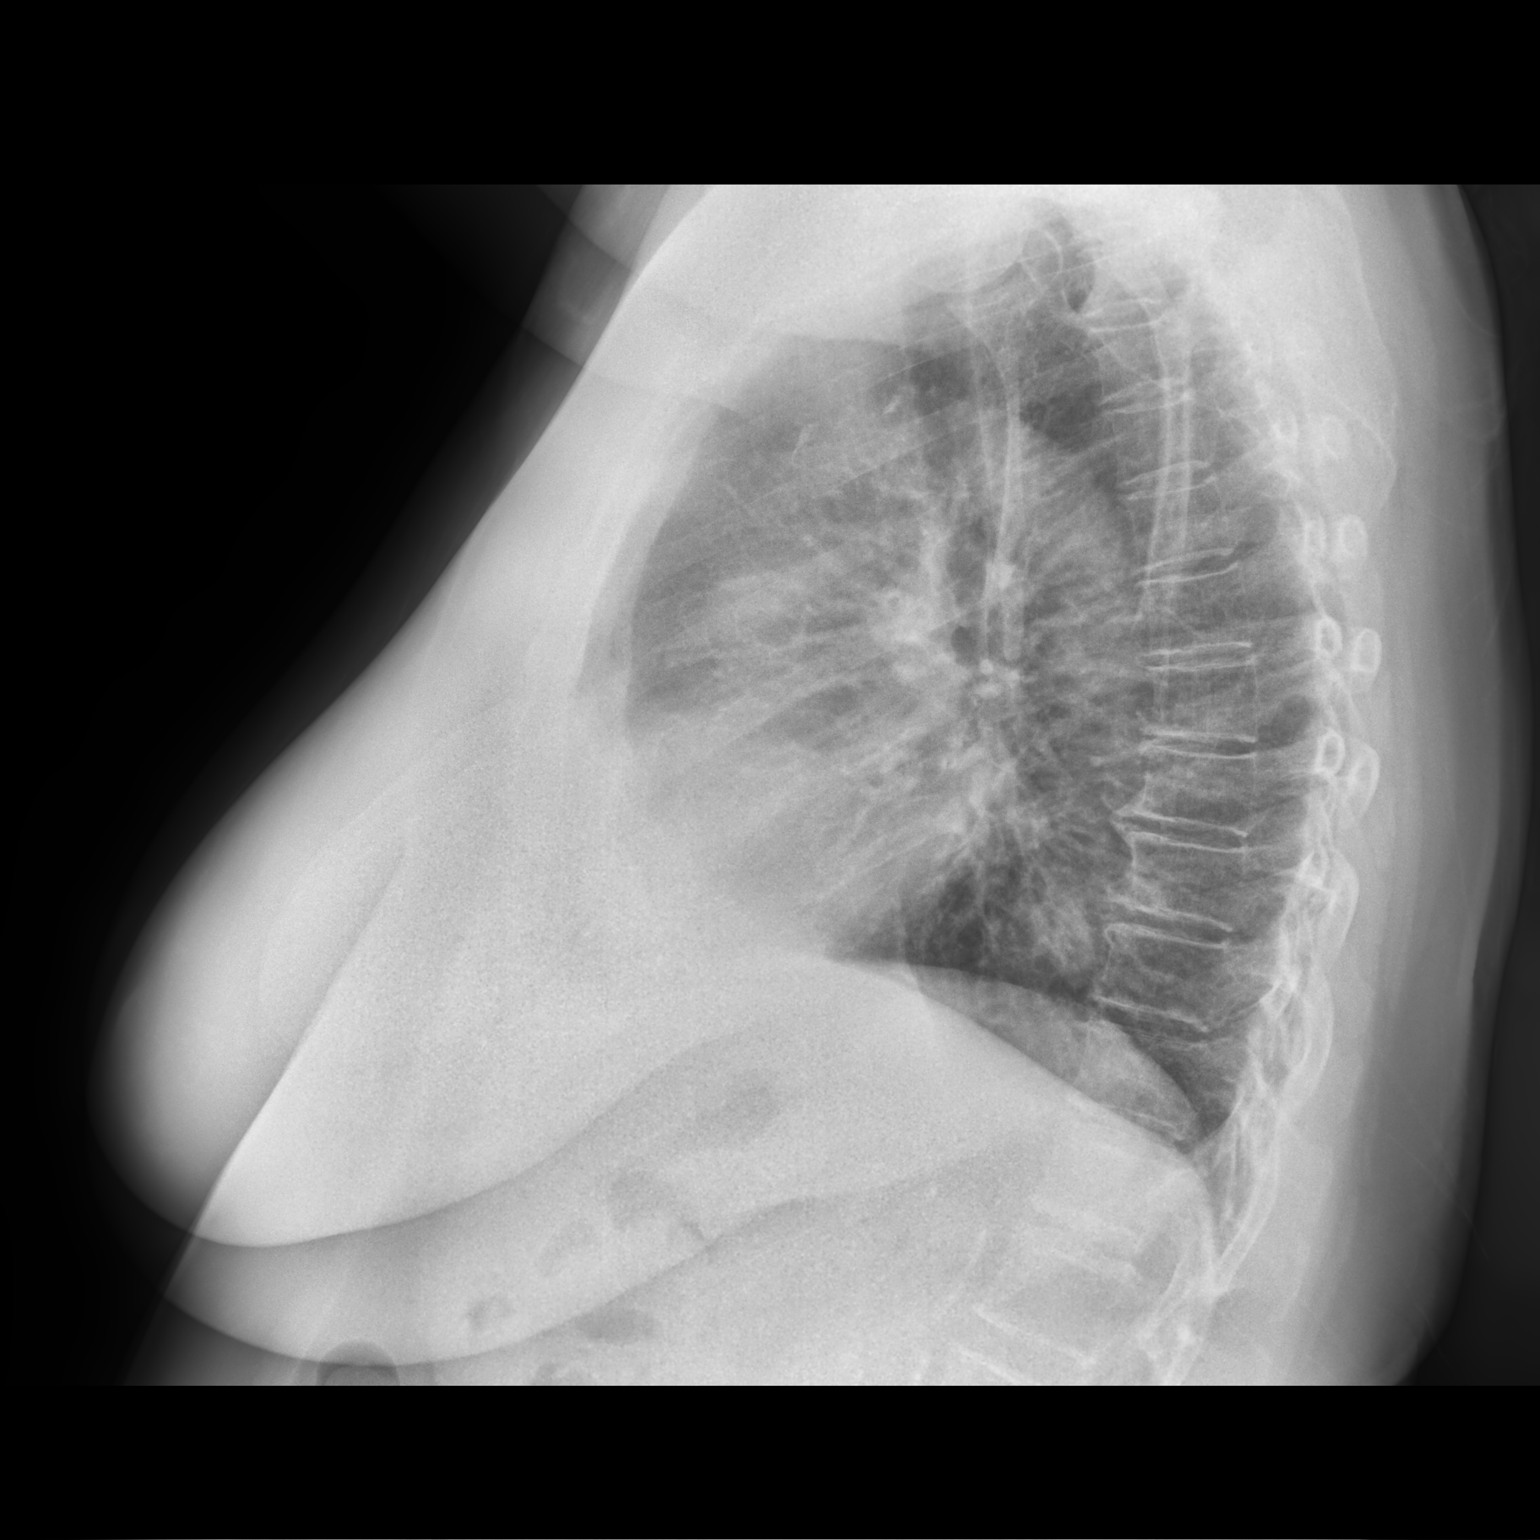

[2 of 2 positions shown; findings below may reference images not displayed]

FINDINGS: Emphysematous disease with chronic bronchitic changes. No focal
opacity or pleural effusion. Normal heart size. Aortic
atherosclerosis. No pneumothorax.
IMPRESSION: No active cardiopulmonary disease. Emphysematous disease.

## 2022-08-28 ENCOUNTER — Other Ambulatory Visit: Payer: Self-pay

## 2022-08-28 MED ORDER — FAMOTIDINE 40 MG PO TABS: ORAL_TABLET | ORAL | 3 refills | Status: DC

## 2022-09-07 ENCOUNTER — Other Ambulatory Visit: Payer: Self-pay

## 2022-09-07 MED ORDER — OMEPRAZOLE 40 MG PO CPDR: 40.0000 mg | DELAYED_RELEASE_CAPSULE | Freq: Every day | ORAL | 3 refills | Status: DC

## 2022-09-22 ENCOUNTER — Telehealth: Payer: Self-pay | Admitting: Urology

## 2022-09-22 NOTE — Telephone Encounter (Addendum)
DOS - 12/15/22  TARSAL EXOSTECTOMY RIGHT --- [address]  [employer] EFFECTIVE DATE - 10/09/21  DEDUCTIBLE - $3,000.00 W/ $2,880.40 REMAINING OOP - $5,000.00 W/ $3,860.06 REMAINING COINSURANCE - 20%  PER [employer] WEBSITE FOR CPT CODE 60454 Notification or Prior Authorization is not required for the requested services This [employer] plan does not currently require a prior authorization for these services. If you have general questions about the prior authorization requirements, please call us at [phone] or visit [email or web address] > Clinician Resources > Advance and Admission Notification Requirements. The number above acknowledges your notification. Please write this number down for future reference. Notification is not a guarantee of coverage or payment.   Decision ID #: [id number]

## 2022-10-02 ENCOUNTER — Telehealth: Payer: Self-pay

## 2022-10-02 NOTE — Telephone Encounter (Signed)
Jamie Pennington called to reschedule her surgery with Dr. Charlsie Merles on 10/20/2022. 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 I have her rescheduled to 12/15/2022. Notified Dr. Charlsie Merles and Aram Beecham at [facility].

## 2022-10-18 ENCOUNTER — Other Ambulatory Visit: Payer: Self-pay | Admitting: Internal Medicine

## 2022-10-18 ENCOUNTER — Other Ambulatory Visit: Payer: Self-pay | Admitting: Pulmonary Disease

## 2022-10-18 ENCOUNTER — Other Ambulatory Visit: Payer: Self-pay | Admitting: Physician Assistant

## 2022-10-19 ENCOUNTER — Other Ambulatory Visit: Payer: Self-pay

## 2022-10-19 ENCOUNTER — Encounter: Payer: 59 | Admitting: Podiatry

## 2022-10-19 ENCOUNTER — Other Ambulatory Visit: Payer: Self-pay | Admitting: *Deleted

## 2022-10-19 MED ORDER — AMLODIPINE BESYLATE 5 MG PO TABS: ORAL_TABLET | ORAL | 0 refills | Status: DC

## 2022-10-19 MED ORDER — ATORVASTATIN CALCIUM 80 MG PO TABS: ORAL_TABLET | ORAL | 0 refills | Status: DC

## 2022-10-19 MED ORDER — METOPROLOL TARTRATE 25 MG PO TABS: 25.0000 mg | ORAL_TABLET | Freq: Two times a day (BID) | ORAL | 0 refills | Status: DC

## 2022-10-19 MED ORDER — EZETIMIBE 10 MG PO TABS: 10.0000 mg | ORAL_TABLET | Freq: Every day | ORAL | 0 refills | Status: DC

## 2022-10-19 MED ORDER — OMEPRAZOLE 40 MG PO CPDR: 40.0000 mg | DELAYED_RELEASE_CAPSULE | Freq: Every day | ORAL | 0 refills | Status: DC

## 2022-10-19 NOTE — Addendum Note (Signed)
Addended by: Margaret Pyle D on: 10/19/2022 02:15 PM   Modules accepted: Orders

## 2022-10-21 ENCOUNTER — Telehealth: Payer: Self-pay | Admitting: *Deleted

## 2022-10-21 NOTE — Telephone Encounter (Signed)
Lvm at [facility] pharmacy @ [phone] that pt cannot receive a # 90 day supply due to pt has not been seen for a yearly f/u.

## 2022-10-26 ENCOUNTER — Encounter: Payer: 59 | Admitting: Podiatry

## 2022-11-02 ENCOUNTER — Encounter: Payer: 59 | Admitting: Podiatry

## 2022-11-09 ENCOUNTER — Encounter: Payer: 59 | Admitting: Podiatry

## 2022-11-11 ENCOUNTER — Telehealth: Payer: Self-pay | Admitting: Podiatry

## 2022-11-11 ENCOUNTER — Other Ambulatory Visit: Payer: Self-pay

## 2022-11-11 MED ORDER — METOPROLOL TARTRATE 25 MG PO TABS: 25.0000 mg | ORAL_TABLET | Freq: Two times a day (BID) | ORAL | 0 refills | Status: DC

## 2022-11-11 NOTE — Telephone Encounter (Signed)
Pt's medication was sent to pt's pharmacy as requested. Confirmation received.  °

## 2022-11-11 NOTE — Telephone Encounter (Signed)
Needs a refill on prescription diclofenac (VOLTAREN) 75 MG EC tablet   She is currently in [facility] needs sent to [facility] at [employer] address: [address], [place], [place] [address] Requesting more than qt of 50 due to take twice a day

## 2022-11-11 NOTE — Telephone Encounter (Signed)
I cannot send to [place]. She can take 2 alleve twice a day until she returns

## 2022-11-13 ENCOUNTER — Other Ambulatory Visit: Payer: Self-pay | Admitting: *Deleted

## 2022-11-13 MED ORDER — OMEPRAZOLE 40 MG PO CPDR: 40.0000 mg | DELAYED_RELEASE_CAPSULE | Freq: Every day | ORAL | 0 refills | Status: DC

## 2022-11-16 NOTE — Telephone Encounter (Signed)
The alleve taken 4 a day will do the same thing

## 2022-12-07 ENCOUNTER — Telehealth: Payer: Self-pay | Admitting: Podiatry

## 2022-12-07 NOTE — Telephone Encounter (Signed)
PATIENT CALLED IN TO ADVISE THAT SHE NEEDED TO CANCEL SURGERY. SHE STATES THAT THE COST IS TOO HIGH. ADVISED DR REGAL AND [facility]

## 2022-12-07 NOTE — Telephone Encounter (Signed)
I can do this in the office for her

## 2022-12-11 ENCOUNTER — Telehealth: Payer: Self-pay | Admitting: Pulmonary Disease

## 2022-12-14 NOTE — Telephone Encounter (Signed)
Pt calling in to get medicine sent in Fluticasone-Umeclidin-Vilant [facility] ELLIPTA) 100-62.5-25 MCG/ACT AEPB [[id number]]  to the [facility] in [facility]

## 2022-12-15 NOTE — Telephone Encounter (Signed)
TRELEGY ELLIPTA 100-62.5-25 MCG/ACT AEPB 60 each 2 12/15/2022 --   Sig: INHALE ONE PUFF BY MOUTH ONE TIME DAILY   Sent to pharmacy as: Dwyane Luo 100-62.5-25 MCG/ACT Aerosol Powder, Breath Activtivatede   E-Prescribing Status: Receipt confirmed by pharmacy (12/15/2022  7:42 AM EDT)

## 2022-12-21 ENCOUNTER — Encounter: Payer: 59 | Admitting: Podiatry

## 2023-01-04 ENCOUNTER — Encounter: Payer: 59 | Admitting: Podiatry

## 2023-01-07 ENCOUNTER — Other Ambulatory Visit: Payer: Self-pay

## 2023-01-07 MED ORDER — AMLODIPINE BESYLATE 5 MG PO TABS: ORAL_TABLET | ORAL | 1 refills | Status: DC

## 2023-01-07 MED ORDER — EZETIMIBE 10 MG PO TABS: 10.0000 mg | ORAL_TABLET | Freq: Every day | ORAL | 1 refills | Status: DC

## 2023-01-07 NOTE — Addendum Note (Signed)
Addended by: Margaret Pyle D on: 01/07/2023 03:04 PM   Modules accepted: Orders

## 2023-02-02 ENCOUNTER — Ambulatory Visit: Payer: 59 | Admitting: Pulmonary Disease

## 2023-02-04 ENCOUNTER — Other Ambulatory Visit: Payer: Self-pay | Admitting: Internal Medicine

## 2023-02-04 ENCOUNTER — Encounter: Payer: Self-pay | Admitting: Podiatry

## 2023-02-04 ENCOUNTER — Other Ambulatory Visit: Payer: Self-pay | Admitting: Pulmonary Disease

## 2023-02-04 ENCOUNTER — Ambulatory Visit: Payer: 59 | Admitting: Podiatry

## 2023-02-04 ENCOUNTER — Ambulatory Visit (INDEPENDENT_AMBULATORY_CARE_PROVIDER_SITE_OTHER): Payer: 59

## 2023-02-04 VITALS — BP 186/85 | HR 76

## 2023-02-04 DIAGNOSIS — M722 Plantar fascial fibromatosis: Secondary | ICD-10-CM

## 2023-02-04 DIAGNOSIS — M7752 Other enthesopathy of left foot: Secondary | ICD-10-CM

## 2023-02-04 DIAGNOSIS — M76811 Anterior tibial syndrome, right leg: Secondary | ICD-10-CM | POA: Diagnosis not present

## 2023-02-04 MED ORDER — DICLOFENAC SODIUM 75 MG PO TBEC: 75.0000 mg | DELAYED_RELEASE_TABLET | Freq: Two times a day (BID) | ORAL | 2 refills | Status: DC

## 2023-02-04 MED ORDER — TRIAMCINOLONE ACETONIDE 10 MG/ML IJ SUSP
10.0000 mg | Freq: Once | INTRAMUSCULAR | Status: AC
Start: 2023-02-04 — End: 2023-02-04
  Administered 2023-02-04: 10 mg via INTRA_ARTICULAR

## 2023-02-05 ENCOUNTER — Other Ambulatory Visit: Payer: Self-pay | Admitting: *Deleted

## 2023-02-05 MED ORDER — OMEPRAZOLE 40 MG PO CPDR: 40.0000 mg | DELAYED_RELEASE_CAPSULE | Freq: Every day | ORAL | 0 refills | Status: DC

## 2023-02-05 NOTE — Progress Notes (Signed)
Subjective:   Patient ID: Francella Solian, female   DOB: 67 y.o.   MRN: [id number]   HPI Patient presents stating she is getting a lot of pain across the top of both feet with the left being more the joint and the right being more around the midtarsal joint where she has arthritis and spur formation that is painful   ROS      Objective:  Physical Exam  Neurovascular status intact with patient found to have inflammation of the lesser MPJs left with fluid buildup around the joint surfaces and on the right there is noted to be inflammation pain around the midtarsal joint medial cuneiform where there is spurring formation and irritation of tendon tissue     Assessment:  Inflammatory capsulitis left lesser MPJ centered around the second and third and on the right tendinitis secondary to bone spur formation creating irritation     Plan:  H&P reviewed and at this point I did go ahead and I did carefully inject the second and third MPJs periarticular 3 mg dexamethasone Kenalog 5 mg Xylocaine and for the right I did a careful injection of the dorsal tendon complex 3 mg dexamethasone Kenalog 5 mg Xylocaine and advised on anti-inflammatories and physical therapy.  Patient will be seen back as needed ultimately need surgery on this right foot but she is going to try to wait till the beginning of next year  X-rays do not indicate stress fracture of the lesser MPJs left moderate arthritis and does indicate spurring which is intensified on the right foot

## 2023-02-11 ENCOUNTER — Encounter: Payer: Self-pay | Admitting: Podiatry

## 2023-02-15 NOTE — Telephone Encounter (Signed)
She can pick it up

## 2023-02-23 ENCOUNTER — Ambulatory Visit (INDEPENDENT_AMBULATORY_CARE_PROVIDER_SITE_OTHER): Payer: 59 | Admitting: Pulmonary Disease

## 2023-02-23 ENCOUNTER — Encounter: Payer: Self-pay | Admitting: Pulmonary Disease

## 2023-02-23 VITALS — BP 130/80 | HR 88 | Ht 63.0 in | Wt 193.0 lb

## 2023-02-23 DIAGNOSIS — R918 Other nonspecific abnormal finding of lung field: Secondary | ICD-10-CM

## 2023-02-23 NOTE — Patient Instructions (Signed)
Follow-up a year from now  Continue Trelegy  Graded activities as tolerated  Schedule for CT scan of the chest to follow-up on lung nodules  Call us with significant concerns

## 2023-02-23 NOTE — Progress Notes (Signed)
Jamie Pennington    [id number]    05-Mar-1956  Primary Care Physician:Chambliss, Estill Batten, MD  Referring Physician: Carney Living, MD [address] [place],  [place] [address]  Chief complaint:   Follow-up for COPD  HPI:  No change in status Appears to be doing relatively well  She does have occasional cough Overall breathing feels stable  Continues to use [employer] did not recently cover repeat CT  Denies any productive cough, no chest pain or chest discomfort  History of COPD Currently on Trelegy, albuterol as needed-continues to benefit from it Quit smoking in 1999  Shortness of breath with moderate exertion No pertinent occupational history  She stated she did not notice any significant change with her breathing with the use of Trelegy  Outpatient Encounter Medications as of 02/23/2023  Medication Sig   acetaminophen (TYLENOL) 500 MG tablet Take 1,000 mg by mouth every 6 (six) hours as needed for headache.   albuterol (VENTOLIN HFA) 108 (90 Base) MCG/ACT inhaler Inhale 2 puffs into the lungs as needed (Wheezing).   amLODipine (NORVASC) 5 MG tablet TAKE 1 TABLET(5 MG) BY MOUTH DAILY   aspirin 81 MG tablet Take 81 mg by mouth daily. Reported on 11/27/2015   atorvastatin (LIPITOR) 80 MG tablet TAKE ONE TABLET BY MOUTH ONE TIME DAILY   benzonatate (TESSALON) 200 MG capsule Take 1 capsule (200 mg total) by mouth 3 (three) times daily as needed.   diclofenac (VOLTAREN) 75 MG EC tablet Take 1 tablet (75 mg total) by mouth 2 (two) times daily.   Evolocumab (REPATHA SURECLICK) 140 MG/ML SOAJ Inject 140 mg into the skin every 14 (fourteen) days.   ezetimibe (ZETIA) 10 MG tablet Take 1 tablet (10 mg total) by mouth daily.   famotidine (PEPCID) 40 MG tablet TAKE 1 TABLET(40 MG) BY MOUTH DAILY   loratadine (CLARITIN) 10 MG tablet Take 10 mg by mouth daily.   metoprolol tartrate (LOPRESSOR) 25 MG tablet TAKE ONE TABLET BY MOUTH TWICE A DAY    montelukast (SINGULAIR) 10 MG tablet TAKE ONE TABLET BY MOUTH ONE TIME DAILY AT BEDTIME   Multiple Vitamin (MULTIVITAMIN) tablet Take 1 tablet by mouth daily.   nitroGLYCERIN (NITROSTAT) 0.4 MG SL tablet Place 0.4 mg under the tongue every 5 (five) minutes as needed for chest pain (MAX 3 TABLETS).   omeprazole (PRILOSEC) 40 MG capsule Take 1 capsule (40 mg total) by mouth daily.   TRELEGY ELLIPTA 100-62.5-25 MCG/ACT AEPB INHALE ONE PUFF BY MOUTH ONE TIME DAILY   Zoster Vaccine Adjuvanted [facility]) injection Inject 0.5 ml IM and Repeat in 2 months   [DISCONTINUED] diclofenac (VOLTAREN) 75 MG EC tablet TAKE 1 TABLET(75 MG) BY MOUTH TWICE DAILY   No facility-administered encounter medications on file as of 02/23/2023.    Allergies as of 02/23/2023   (No Known Allergies)    Past Medical History:  Diagnosis Date   COPD (chronic obstructive pulmonary disease) (HCC)    Coronary artery disease    GERD (gastroesophageal reflux disease)    Headache    "probably weekly" (02/24/2018)   Hepatitis 1972   "don't remember what kind" (02/24/2018)   Hyperlipidemia    Hypertension    Myocardial infarct (HCC) 2004   s/p stent placement    Pneumonia    "once" (02/24/2018)    Past Surgical History:  Procedure Laterality Date   AUGMENTATION MAMMAPLASTY Bilateral ~ 2008   implants   BREAST BIOPSY Right    "  negative"   CARDIAC CATHETERIZATION N/A 11/28/2014   Procedure: Left Heart Cath and Coronary Angiography;  Surgeon: Lennette Bihari, MD;  [facility] INVASIVE CV LAB;  Service: Cardiovascular;  Laterality: N/A;   CORONARY ANGIOPLASTY WITH STENT PLACEMENT  2004   with stent  placement to the right coronary.   Cotton Osteotomy with Bone Graft Right 11/24/2012   @ [facility]   KNEE ARTHROSCOPY Left 2010   Plantar Endoscopic Fasciotomy Right 11/24/2012   @ [facility]    Family History  Problem Relation Age of Onset   Stroke Father 25       died   Alzheimer's disease Mother 54       died   Heart attack  Sister        and bypass surgery    Social History   Socioeconomic History   Marital status: Divorced    Spouse name: Not on file   Number of children: Not on file   Years of education: Not on file   Highest education level: Not on file  Occupational History   Not on file  Tobacco Use   Smoking status: Former    Current packs/day: 0.00    Average packs/day: 1.5 packs/day for 26.5 years (39.7 ttl pk-yrs)    Types: Cigarettes    Start date: 07/24/1971    Quit date: 01/18/1998    Years since quitting: 25.1   Smokeless tobacco: Never  Vaping Use   Vaping status: Never Used  Substance and Sexual Activity   Alcohol use: Not Currently    Comment: 02/24/2018 "just in my teens"   Drug use: Never   Sexual activity: Not Currently  Other Topics Concern   Not on file  Social History Narrative   ** Merged History Encounter **       Social Determinants of Health   Financial Resource Strain: Not on file  Food Insecurity: Not on file  Transportation Needs: Not on file  Physical Activity: Not on file  Stress: Not on file  Social Connections: Not on file  Intimate Partner Violence: Not on file    Review of Systems  Constitutional: Negative.   HENT: Negative.    Eyes: Negative.   Respiratory:  Positive for cough and shortness of breath.   Cardiovascular: Negative.   Gastrointestinal: Negative.   Endocrine: Negative.   Psychiatric/Behavioral:  Negative for sleep disturbance.     Vitals:   02/23/23 1443  BP: 130/80  Pulse: 88  SpO2: 92%     Physical Exam Constitutional:      Appearance: Normal appearance. She is well-developed.  HENT:     Head: Normocephalic and atraumatic.     Nose: Nose normal.     Mouth/Throat:     Mouth: Mucous membranes are moist.  Eyes:     General: No scleral icterus. Neck:     Thyroid: No thyromegaly.     Trachea: No tracheal deviation.  Cardiovascular:     Rate and Rhythm: Normal rate and regular rhythm.  Pulmonary:     Effort:  Pulmonary effort is normal. No respiratory distress.     Breath sounds: Normal breath sounds. No stridor. No wheezing, rhonchi or rales.  Musculoskeletal:     Cervical back: No rigidity or tenderness.  Neurological:     Mental Status: She is alert.  Psychiatric:        Mood and Affect: Mood normal.    Data Reviewed: Chest x-ray 02/25/2018 shows no acute infiltrate  CT scan of the  chest was reviewed with the patient, noted extensive emphysema, lower lobe lung nodules-most recent CT was 10/04/2020  Pulmonary function test is stable from previous PFT 09/22/2021 showing moderate reduction in diffusing capacity  Assessment:   Obstructive lung disease/emphysema -Continue Trelegy -Continue albuterol as needed  Lung nodules were stable previously -I believe she will still benefit from having a repeat in a year to follow-up on lung nodules  Rescue inhaler as needed  Graded activities as tolerated   Plan/Recommendations:  Continue Trelegy  Repeat CT scan of the chest  Follow-up a year from now  Encouraged to call with significant concerns   Virl Diamond MD [facility] 02/23/2023, 2:55 PM  CC: Carney Living, *

## 2023-03-01 ENCOUNTER — Other Ambulatory Visit: Payer: Self-pay | Admitting: Family Medicine

## 2023-03-01 ENCOUNTER — Other Ambulatory Visit: Payer: Self-pay | Admitting: Internal Medicine

## 2023-03-08 NOTE — Progress Notes (Unsigned)
Cardiology Office Note   Date:  03/08/2023   ID:  Jamie Pennington, DOB 21-Jul-1955, MRN [id number]  PCP:  Carney Living, MD  Cardiologist:   Dietrich Pates, MD       History of Present Illness: Jamie Pennington is a 67 y.o. female with a history of CAD, HTN, COPD, HL           No outpatient medications have been marked as taking for the 03/12/23 encounter (Appointment) with Pricilla Riffle, MD.     Allergies:   Patient has no known allergies.   Past Medical History:  Diagnosis Date   COPD (chronic obstructive pulmonary disease) (HCC)    Coronary artery disease    GERD (gastroesophageal reflux disease)    Headache    "probably weekly" (02/24/2018)   Hepatitis 1972   "don't remember what kind" (02/24/2018)   Hyperlipidemia    Hypertension    Myocardial infarct (HCC) 2004   s/p stent placement    Pneumonia    "once" (02/24/2018)    Past Surgical History:  Procedure Laterality Date   AUGMENTATION MAMMAPLASTY Bilateral ~ 2008   implants   BREAST BIOPSY Right    "negative"   CARDIAC CATHETERIZATION N/A 11/28/2014   Procedure: Left Heart Cath and Coronary Angiography;  Surgeon: Lennette Bihari, MD;  Location: [facility] INVASIVE CV LAB;  Service: Cardiovascular;  Laterality: N/A;   CORONARY ANGIOPLASTY WITH STENT PLACEMENT  2004   with stent  placement to the right coronary.   Cotton Osteotomy with Bone Graft Right 11/24/2012   @ [facility]   KNEE ARTHROSCOPY Left 2010   Plantar Endoscopic Fasciotomy Right 11/24/2012   @ [facility]     Social History:  The patient  reports that she quit smoking about 25 years ago. Her smoking use included cigarettes. She started smoking about 51 years ago. She has a 39.7 pack-year smoking history. She has never used smokeless tobacco. She reports that she does not currently use alcohol. She reports that she does not use drugs.   Family History:  The patient's family history includes Alzheimer's disease (age of onset: 33) in her mother; Heart attack in  her sister; Stroke (age of onset: 77) in her father.    ROS:  Please see the history of present illness. All other systems are reviewed and  Negative to the above problem except as noted.    PHYSICAL EXAM: VS:  There were no vitals taken for this visit.  GEN: Well nourished, well developed, in no acute distress  HEENT: normal  Neck: no JVD, carotid bruits, or masses Cardiac: RRR; no murmurs, rubs, or gallops,no edema  Respiratory:  clear to auscultation bilaterally, normal work of breathing GI: soft, nontender, nondistended, + BS  No hepatomegaly  MS: no deformity Moving all extremities   Skin: warm and dry, no rash Neuro:  Strength and sensation are intact Psych: euthymic mood, full affect   EKG:  EKG is ordered today.   Lipid Panel    Component Value Date/Time   CHOL 113 06/30/2022 0836   TRIG 133 06/30/2022 0836   HDL 40 06/30/2022 0836   CHOLHDL 2.8 06/30/2022 0836   CHOLHDL 3.6 06/20/2015 0849   VLDL 24 06/20/2015 0849   LDLCALC 50 06/30/2022 0836   LDLDIRECT 99 08/02/2012 0917      Wt Readings from Last 3 Encounters:  02/23/23 193 lb (87.5 kg)  08/17/22 192 lb (87.1 kg)  07/23/22 192 lb 9.6 oz (87.4 kg)  ASSESSMENT AND PLAN:     Current medicines are reviewed at length with the patient today.  The patient does not have concerns regarding medicines.  Signed, Dietrich Pates, MD  03/08/2023 7:58 PM    [facility] [address] [place], [place], [place]  [address] Phone: [phone]; Fax: [phone]

## 2023-03-09 ENCOUNTER — Other Ambulatory Visit: Payer: Self-pay

## 2023-03-10 ENCOUNTER — Ambulatory Visit
Admission: RE | Admit: 2023-03-10 | Discharge: 2023-03-10 | Disposition: A | Discharge status: Home / Self Care | Payer: 59 | Source: Ambulatory Visit | Attending: Pulmonary Disease

## 2023-03-10 DIAGNOSIS — R918 Other nonspecific abnormal finding of lung field: Secondary | ICD-10-CM

## 2023-03-12 ENCOUNTER — Encounter: Payer: Self-pay | Admitting: Internal Medicine

## 2023-03-12 ENCOUNTER — Other Ambulatory Visit: Payer: Self-pay | Admitting: *Deleted

## 2023-03-12 ENCOUNTER — Ambulatory Visit: Payer: 59 | Attending: Internal Medicine | Admitting: Internal Medicine

## 2023-03-12 VITALS — BP 146/88 | HR 75 | Ht 63.0 in | Wt 196.8 lb

## 2023-03-12 DIAGNOSIS — I10 Essential (primary) hypertension: Secondary | ICD-10-CM

## 2023-03-12 MED ORDER — TRIAMTERENE-HCTZ 37.5-25 MG PO TABS: 1.0000 | ORAL_TABLET | Freq: Every day | ORAL | 6 refills | Status: DC

## 2023-03-12 NOTE — Patient Instructions (Signed)
Medication Instructions:  Your physician has recommended you make the following change in your medication: Start Maxzide 37.5/25 mg by mouth daily  *If you need a refill on your cardiac medications before your next appointment, please call your pharmacy*   Lab Work: Your physician recommends that you return for lab work in: 2 weeks--around November 15.  This is not fasting.  The lab is open from 7:30 AM-4:00 PM.  Closed from 12:30-1:30  If you have labs (blood work) drawn today and your tests are completely normal, you will receive your results only by: MyChart Message (if you have MyChart) OR A paper copy in the mail If you have any lab test that is abnormal or we need to change your treatment, we will call you to review the results.   Testing/Procedures: none   Follow-Up: At [facility], you and your health needs are our priority.  As part of our continuing mission to provide you with exceptional heart care, we have created designated Provider Care Teams.  These Care Teams include your primary Cardiologist (physician) and Advanced Practice Providers (APPs -  Physician Assistants and Nurse Practitioners) who all work together to provide you with the care you need, when you need it.  We recommend signing up for the patient portal called "MyChart".  Sign up information is provided on this After Visit Summary.  MyChart is used to connect with patients for Virtual Visits (Telemedicine).  Patients are able to view lab/test results, encounter notes, upcoming appointments, etc.  Non-urgent messages can be sent to your provider as well.   To learn more about what you can do with MyChart, go to [email or web address].    Your next appointment:   6 month(s)--April/May 2025  Provider:   Dietrich Pates, MD     Other Instructions

## 2023-03-13 ENCOUNTER — Other Ambulatory Visit: Payer: Self-pay | Admitting: Pulmonary Disease

## 2023-03-16 ENCOUNTER — Other Ambulatory Visit: Payer: Self-pay

## 2023-03-16 ENCOUNTER — Encounter: Payer: Self-pay | Admitting: Family Medicine

## 2023-03-16 ENCOUNTER — Ambulatory Visit (INDEPENDENT_AMBULATORY_CARE_PROVIDER_SITE_OTHER): Payer: 59 | Admitting: Family Medicine

## 2023-03-16 VITALS — BP 182/89 | HR 67 | Ht 63.0 in | Wt 195.4 lb

## 2023-03-16 DIAGNOSIS — I2510 Atherosclerotic heart disease of native coronary artery without angina pectoris: Secondary | ICD-10-CM

## 2023-03-16 DIAGNOSIS — J441 Chronic obstructive pulmonary disease with (acute) exacerbation: Secondary | ICD-10-CM | POA: Diagnosis not present

## 2023-03-16 NOTE — Patient Instructions (Signed)
Good to see you today - Thank you for coming in  Things we discussed today:  You need a mammogram to prevent breast cancer.  Please schedule an appointment.  You can call [phone].     Remember the Shingrix  I will ask Dr Tenny Craw about the GLP1s   Continue regular exercise

## 2023-03-16 NOTE — Progress Notes (Signed)
    SUBJECTIVE:   CHIEF COMPLAINT / HPI:   Hypertension Followed by cardiology.  Started Maxzide 11/1.  Bmet ordered Started yesterday  Cholesterol Followed by cardiology   GERD Taking famotidine and omeprazole daily.  If does not take this has heartburn.  Other wise no problems with swallowing or bleeding  Exercise  Swims fairly regularly  Weight - is stable.  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  No murmurs, gallops or rubs.    Lungs:  Normal respiratory effort, chest expands symmetrically. Lungs are clear to auscultation, no crackles or wheezes. Abdomen: soft and non-tender without masses, organomegaly or hernias noted.  No guarding or rebound Extremities:  No cyanosis, edema, or deformity noted with good range of motion of all major joints.     ASSESSMENT/PLAN:   Annual Examination Female over 68 yo  I reviewed the following patient responses on our Physical Exam Form Tobacco use and candidacy for lung cancer screening - as per Dr Wynona Neat Alcohol Use  Weight - discussed diet she is limited as to exercise.  Will check with her cardiologist to possible GLP1  Exercise  Risk for STI  Fall risk Advanced Directive - she has.  Asked her to give [place] or her other physicians a copy  Increased family cancer risk Violence risk  PHQ9 score reviewed  Blood pressure reviewed  I considered the following items based upon USPSTF recommendations: Cervical Cancer if female at birth Mammogram - she has scheduled Colon cancer screening Osteoporosis screening HIV testing:  Hepatitis C  testing Cholesterol screening STI screening if high risk (Hepatitis B, Syphilis, Gonorrhea, Chlamydia) Immunizations - Influenza, Covid, [place], Pneumonia, Tetanus  See After Visit Summary for recommendations   Chronic obstructive pulmonary disease with acute exacerbation (HCC)  Coronary artery disease involving native coronary artery of native heart without angina pectoris     Patient Instructions  Good to see you today - Thank you for coming in  Things we discussed today:  You need a mammogram to prevent breast cancer.  Please schedule an appointment.  You can call [phone].     Remember the Shingrix  I will ask Dr Tenny Craw about the GLP1s   Continue regular exercise   Carney Living, MD [facility]

## 2023-03-17 ENCOUNTER — Encounter: Payer: Self-pay | Admitting: Pulmonary Disease

## 2023-03-22 NOTE — Telephone Encounter (Signed)
Rx sent 03/19/23

## 2023-03-31 LAB — BASIC METABOLIC PANEL
BUN/Creatinine Ratio: 28 (ref 12–28)
BUN: 34 mg/dL — ABNORMAL HIGH (ref 8–27)
CO2: 24 mmol/L (ref 20–29)
Calcium: 10.4 mg/dL — ABNORMAL HIGH (ref 8.7–10.3)
Chloride: 95 mmol/L — ABNORMAL LOW (ref 96–106)
Creatinine, Ser: 1.21 mg/dL — ABNORMAL HIGH (ref 0.57–1.00)
Glucose: 99 mg/dL (ref 70–99)
Potassium: 4.7 mmol/L (ref 3.5–5.2)
Sodium: 137 mmol/L (ref 134–144)
eGFR: 49 mL/min/{1.73_m2} — ABNORMAL LOW (ref >59)

## 2023-04-02 ENCOUNTER — Encounter: Payer: Self-pay | Admitting: Family Medicine

## 2023-04-02 ENCOUNTER — Encounter: Payer: Self-pay | Admitting: Pulmonary Disease

## 2023-04-02 DIAGNOSIS — E66811 Obesity, class 1: Secondary | ICD-10-CM

## 2023-04-03 ENCOUNTER — Other Ambulatory Visit: Payer: Self-pay | Admitting: Internal Medicine

## 2023-04-05 DIAGNOSIS — E66811 Obesity, class 1: Secondary | ICD-10-CM | POA: Insufficient documentation

## 2023-04-05 MED ORDER — WEGOVY 0.25 MG/0.5ML ~~LOC~~ SOAJ: 0.2500 mg | SUBCUTANEOUS | 1 refills | Status: DC

## 2023-04-05 NOTE — Addendum Note (Signed)
Addended by: Pearlean Brownie L on: 04/05/2023 01:48 PM   Modules accepted: Orders

## 2023-04-06 ENCOUNTER — Encounter: Payer: Self-pay | Admitting: Internal Medicine

## 2023-04-06 ENCOUNTER — Other Ambulatory Visit: Payer: Self-pay

## 2023-04-06 DIAGNOSIS — R7989 Other specified abnormal findings of blood chemistry: Secondary | ICD-10-CM

## 2023-04-06 DIAGNOSIS — I10 Essential (primary) hypertension: Secondary | ICD-10-CM

## 2023-04-06 NOTE — Telephone Encounter (Signed)
Dr Wynona Neat, please advise on CT Chest results 03/10/23  Thanks!

## 2023-04-06 NOTE — Telephone Encounter (Signed)
Pt called to report that she has held the Maxzide for about a week... she will have a repeat BMET tomorrow at a [facility] in [place] where she currently is... she says her systolic is staying in the 150's but she does not have her readings with her... she will check her BP a couple of hours after taking her other meds and send [place] a MY Chart.

## 2023-04-17 LAB — BASIC METABOLIC PANEL
BUN/Creatinine Ratio: 19 (ref 12–28)
BUN: 16 mg/dL (ref 8–27)
CO2: 22 mmol/L (ref 20–29)
Calcium: 8.9 mg/dL (ref 8.7–10.3)
Chloride: 108 mmol/L — ABNORMAL HIGH (ref 96–106)
Creatinine, Ser: 0.84 mg/dL (ref 0.57–1.00)
Glucose: 119 mg/dL — ABNORMAL HIGH (ref 70–99)
Potassium: 4.3 mmol/L (ref 3.5–5.2)
Sodium: 143 mmol/L (ref 134–144)
eGFR: 76 mL/min/{1.73_m2} (ref >59)

## 2023-04-19 ENCOUNTER — Telehealth: Payer: Self-pay

## 2023-04-19 NOTE — Telephone Encounter (Signed)
Pharmacy Patient Advocate Encounter   Received notification from CoverMyMeds that prior authorization for [facility] is required/requested.   The patient is insured through [facility] .   PA required; PA submitted to above mentioned insurance via CoverMyMeds Key/confirmation #/EOC [id number]. Status is pending

## 2023-04-20 ENCOUNTER — Other Ambulatory Visit: Payer: Self-pay

## 2023-04-20 MED ORDER — AMLODIPINE BESYLATE 5 MG PO TABS: 5.0000 mg | ORAL_TABLET | Freq: Two times a day (BID) | ORAL | 3 refills | Status: DC

## 2023-04-20 MED ORDER — CARVEDILOL 6.25 MG PO TABS: 6.2500 mg | ORAL_TABLET | Freq: Two times a day (BID) | ORAL | 3 refills | Status: DC

## 2023-04-22 NOTE — Telephone Encounter (Signed)
Pharmacy Patient Advocate Encounter  Received notification from [facility] that Prior Authorization for [facility] has been DENIED.  Full denial letter will be uploaded to the media tab. See denial reason below.  The requested medication and/or diagnosis are not a covered benefit and excluded from coverage in accordance with the terms and conditions of your plan benefit. Therefore, the request has been administratively denied.  PA #/Case ID/Reference #: [id number]

## 2023-05-09 NOTE — Progress Notes (Signed)
Pt with CAD   Please check on approval for GLP 1 agonist

## 2023-05-11 ENCOUNTER — Other Ambulatory Visit (HOSPITAL_COMMUNITY): Payer: Self-pay

## 2023-05-11 ENCOUNTER — Telehealth: Payer: Self-pay | Admitting: Pharmacy Technician

## 2023-05-11 ENCOUNTER — Telehealth: Payer: Self-pay | Admitting: Pharmacist

## 2023-05-11 NOTE — Telephone Encounter (Addendum)
 Pharmacy Patient Advocate Encounter   Received notification from Pt Calls Messages that prior authorization for wegovy  is required/requested.   Insurance verification completed.   The patient is insured through [facility] .   Per test claim: PA required; PA submitted to above mentioned insurance via CoverMyMeds Key/confirmation #/EOC [id number] Status is pending

## 2023-05-11 NOTE — Telephone Encounter (Signed)
-----   Message from Nurse Lauraine CROME sent at 05/11/2023 10:47 AM EST -----  ----- Message ----- From: Okey Vina GAILS, MD Sent: 05/09/2023  11:44 PM EST To: Jenkins CHRISTELLA Section, RN     ----- Message ----- From: Jeanelle Layman CROME, MD Sent: 03/16/2023   2:46 PM EST To: Vina Okey GAILS, MD  Dr Okey Do you think Ms Glasco might be a candidate for a GLP1 given her (admittedly mild) diastolic dysfuction on her last echo?  She would benefit from weight loss for her lower extremity arthritis.  Thanks for considering  [employer]

## 2023-05-13 ENCOUNTER — Encounter: Payer: Self-pay | Admitting: Pharmacist

## 2023-05-13 ENCOUNTER — Other Ambulatory Visit (HOSPITAL_COMMUNITY): Payer: Self-pay

## 2023-05-13 NOTE — Telephone Encounter (Signed)
 Pharmacy Patient Advocate Encounter  Received notification from [employer] that Prior Authorization for wegovy  has been APPROVED from 05/13/23 to 05/12/24. Ran test claim, Copay is $24.99 one month. This test claim was processed through [facility]- copay amounts may vary at other pharmacies due to pharmacy/plan contracts, or as the patient moves through the different stages of their insurance plan.   PA #/Case ID/Reference #: [id number]

## 2023-05-17 ENCOUNTER — Telehealth: Payer: Self-pay

## 2023-05-17 ENCOUNTER — Other Ambulatory Visit: Payer: Self-pay | Admitting: Internal Medicine

## 2023-05-17 NOTE — Telephone Encounter (Signed)
 Pt seeing the Pharm D 05/26/23.

## 2023-05-17 NOTE — Telephone Encounter (Signed)
-----   Message from Vina Gull sent at 05/09/2023 11:43 PM EST -----    ----- Message ----- From: Jeanelle Layman CROME, MD Sent: 03/16/2023   2:46 PM EST To: Vina Gull GAILS, MD  Dr Gull Do you think Ms Chacko might be a candidate for a GLP1 given her (admittedly mild) diastolic dysfuction on her last echo?  She would benefit from weight loss for her lower extremity arthritis.  Thanks for considering  [employer]

## 2023-05-18 ENCOUNTER — Other Ambulatory Visit: Payer: Self-pay | Admitting: Pulmonary Disease

## 2023-05-19 NOTE — Telephone Encounter (Signed)
 Pt was last seen on 02-23-23. The lov does not state whether or not to continue this rx. Pt requesting refill. Please advise.

## 2023-05-26 ENCOUNTER — Ambulatory Visit: Payer: 59 | Attending: Cardiology | Admitting: Pharmacist

## 2023-05-26 VITALS — BP 170/100 | HR 85 | Wt 194.6 lb

## 2023-05-26 DIAGNOSIS — E66811 Obesity, class 1: Secondary | ICD-10-CM | POA: Diagnosis not present

## 2023-05-26 DIAGNOSIS — I10 Essential (primary) hypertension: Secondary | ICD-10-CM

## 2023-05-26 MED ORDER — IRBESARTAN 150 MG PO TABS: 150.0000 mg | ORAL_TABLET | Freq: Every day | ORAL | 3 refills | Status: DC

## 2023-05-26 NOTE — Progress Notes (Signed)
 Patient ID: Jamie Pennington                 DOB: 16-Jun-1955                    MRN: [id number]     HPI: Jamie Pennington is a 68 y.o. female patient referred to pharmacy clinic by Dr. Avanell Bob to initiate GLP1-RA therapy. PMH is significant for CAD s/p PCI, HTN, HLD, and obesity. Most recent BMI 34.47.  Patient presents today to clinic. She reports that over the past year her activity level has decreased due to foot pain. She also reports swelling in her feet (not any worse since increasing amlodipine ). Has to have surgery on her foot (bone supr) soon. She reports she use to run and lift weights. Hard for her to even finish her 8 hr shift at work.   Blood pressure has been high. Stopped checking it at home about a week ago bc it was running 170/80-90. Reports compliance with her medications. On Carvedilol  6.25mg  BID and amlodipine  5mg  BID. Previously on ACE or ARB or thiazide for years. Recently was started on maxzide but her scr bumped and it was stopped.   Baseline weight and BMI: 194, 34.4  Diet:  Breakfast: Bacon, eggs, toast or skips, black coffee Lunch: usually skips Dinner: chicken or hamburger w/ salad Snack: no Drink: water and grape juice (1 glass per day)   Exercise: limited by foot pain  Family History:   The patient's family history includes Alzheimer's disease (age of onset: 71) in her mother; Heart attack in her sister; Stroke (age of onset: 12) in her father.   Social History: no ETOH, no tobacco  Labs: No results found for: "HGBA1C"  Wt Readings from Last 1 Encounters:  03/16/23 195 lb 6.4 oz (88.6 kg)    BP Readings from Last 1 Encounters:  03/16/23 (!) 182/89   Pulse Readings from Last 1 Encounters:  03/16/23 67       Component Value Date/Time   CHOL 113 06/30/2022 0836   TRIG 133 06/30/2022 0836   HDL 40 06/30/2022 0836   CHOLHDL 2.8 06/30/2022 0836   CHOLHDL 3.6 06/20/2015 0849   VLDL 24 06/20/2015 0849   LDLCALC 50 06/30/2022 0836   LDLDIRECT 99 08/02/2012  0917    Past Medical History:  Diagnosis Date   COPD (chronic obstructive pulmonary disease) (HCC)    Coronary artery disease    GERD (gastroesophageal reflux disease)    Headache    "probably weekly" (02/24/2018)   Hepatitis 1972   "don't remember what kind" (02/24/2018)   Hyperlipidemia    Hypertension    Myocardial infarct (HCC) 2004   s/p stent placement    Pneumonia    "once" (02/24/2018)    Current Outpatient Medications on File Prior to Visit  Medication Sig Dispense Refill   acetaminophen  (TYLENOL ) 500 MG tablet Take 1,000 mg by mouth every 6 (six) hours as needed for headache. (Patient not taking: Reported on 03/16/2023)     albuterol  (VENTOLIN  HFA) 108 (90 Base) MCG/ACT inhaler Inhale 2 puffs into the lungs as needed (Wheezing). 8 g 6   amLODipine  (NORVASC ) 5 MG tablet Take 1 tablet (5 mg total) by mouth in the morning and at bedtime. 180 tablet 3   aspirin  81 MG tablet Take 81 mg by mouth daily. Reported on 11/27/2015     atorvastatin  (LIPITOR) 80 MG tablet TAKE ONE TABLET BY MOUTH ONE TIME DAILY 90 tablet 3  benzonatate  (TESSALON ) 200 MG capsule Take 1 capsule (200 mg total) by mouth 3 (three) times daily as needed. (Patient not taking: Reported on 03/16/2023) 45 capsule 1   carvedilol  (COREG ) 6.25 MG tablet Take 1 tablet (6.25 mg total) by mouth 2 (two) times daily. 180 tablet 3   diclofenac  (VOLTAREN ) 75 MG EC tablet Take 1 tablet (75 mg total) by mouth 2 (two) times daily. 50 tablet 2   Evolocumab  (REPATHA  SURECLICK) 140 MG/ML SOAJ INJECT 140MG  INTO THE SKIN EVERY 14 DAYS 2 mL 11   ezetimibe  (ZETIA ) 10 MG tablet TAKE ONE TABLET BY MOUTH ONE TIME DAILY. **PT MUST KEEP UPCOMING APPOINTMENT IN NOVEMBER WITH CARDILOGOSIT BEFORE ANYMORE REFILLS.** 90 tablet 3   famotidine  (PEPCID ) 40 MG tablet TAKE 1 TABLET(40 MG) BY MOUTH DAILY 90 tablet 3   Fluticasone -Umeclidin-Vilant (TRELEGY ELLIPTA ) 100-62.5-25 MCG/ACT AEPB INHALE ONE PUFF BY MOUTH ONE TIME DAILY 60 each 11    loratadine  (CLARITIN ) 10 MG tablet Take 10 mg by mouth daily.     montelukast  (SINGULAIR ) 10 MG tablet TAKE ONE TABLET BY MOUTH ONE TIME DAILY AT BEDTIME 90 tablet 0   Multiple Vitamin (MULTIVITAMIN) tablet Take 1 tablet by mouth daily.     nitroGLYCERIN  (NITROSTAT ) 0.4 MG SL tablet Place 0.4 mg under the tongue every 5 (five) minutes as needed for chest pain (MAX 3 TABLETS).     omeprazole  (PRILOSEC) 40 MG capsule TAKE ONE CAPSULE BY MOUTH ONE TIME DAILY 90 capsule 0   Semaglutide -Weight Management (WEGOVY ) 0.25 MG/0.5ML SOAJ Inject 0.25 mg into the skin once a week. 2 mL 1   Zoster Vaccine Adjuvanted [facility]) injection Inject 0.5 ml IM and Repeat in 2 months (Patient not taking: Reported on 03/16/2023) 0.5 mL 1   No current facility-administered medications on file prior to visit.    No Known Allergies   Assessment/Plan:  1. Weight loss - Patient has not met goal of at least 5% of body weight loss with comprehensive lifestyle modifications alone in the past 3-6 months. Pharmacotherapy is appropriate to pursue as augmentation. Will start Wegovy  0.25mg  weekly. She was approved and test claim $ 24.99. Confirmed patient not pregnant and no personal or family history of medullary thyroid carcinoma (MTC) or Multiple Endocrine Neoplasia syndrome type 2 (MEN 2). No hx of pancreatis or gallstones. Injection technique reviewed at today's visit.  Advised patient on common side effects including nausea, diarrhea, dyspepsia, decreased appetite, and fatigue. Counseled patient on reducing meal size and how to titrate medication to minimize side effects. Counseled patient to call if intolerable side effects or if experiencing dehydration, abdominal pain, or dizziness. Patient will adhere to dietary modifications and will target at least 150 minutes of moderate intensity exercise weekly.   I asked her to focus on strength training. Plenty of upper body and core exercises and some leg exercises she can do  without putting pressure on her feet. Talked about the importance of maintain and building muscle mass with GLP-1 use and success.   Follow up in 1 month via telephone for tolerability update and dose titration.  2. HTN  Assessment: Blood pressure significantly elevated in clinic Home BP has been high as well Reports compliance with medications Encouraged increase activity Scr bump with maxzide. Reports doesn't drink much water  Plan: Start irbesartan  150mg  daily Continue amlodipine  5mg  twice a day and carvedilol  6.25mg  twice a day Labs on 1/27 Follow up in office 1/31  Thank you,  Dillon Mcreynolds D Somara Frymire, Pharm.D, BCACP, CPP [facility] [address], [place], [place] [address]  Phone: [phone]; Fax: [phone]

## 2023-05-26 NOTE — Patient Instructions (Signed)
 Your blood pressure goal is < 130/31mmHg   Please start taking irbesartan  150mg  daily. Continue amlodipine  5mg  twice a day and carvedilol  6.25mg  twice a day  Start checking blood pressure at home  Start Wegovy  0.25mg  weekly  Important lifestyle changes to control high blood pressure  Intervention  Effect on the BP   Weight loss Weight loss is one of the most effective lifestyle changes for controlling blood pressure. If you're overweight or obese, losing even a small amount of weight can help reduce blood pressure.    Blood pressure can decrease by 1 millimeter of mercury (mmHg) with each kilogram (about 2.2 pounds) of weight lost.   Exercise regularly As a general goal, aim for 30 minutes of moderate physical activity every day.    Regular physical activity can lower blood pressure by 5 - 8 mmHg.   Eat a healthy diet Eat a diet rich in whole grains, fruits, vegetables, lean meat, and low-fat dairy products. Limit processed foods, saturated fat, and sweets.    A heart-healthy diet can lower high blood pressure by 10 mmHg.   Reduce salt (sodium) in your diet Aim for 000mg  of sodium each day. Avoid deli meats, canned food, and frozen microwave meals which are high in sodium.     Limiting sodium can reduce blood pressure by 5 mmHg.   Limit alcohol One drink equals 12 ounces of beer, 5 ounces of wine, or 1.5 ounces of 80-proof liquor.    Limiting alcohol to < 1 drink a day for women or < 2 drinks a day for men can help lower blood pressure by about 4 mmHg.   To check your pressure at home you will need to:   Sit up in a chair, with feet flat on the floor and back supported. Do not cross your ankles or legs. Rest your left arm so that the cuff is about heart level. If the cuff goes on your upper arm, then just relax your arm on the table, arm of the chair, or your lap. If you have a wrist cuff, hold your wrist against your chest at heart level. Place the cuff snugly around  your arm, about 1 inch above the crease of your elbow. The cords should be inside the groove of your elbow.  Sit quietly, with the cuff in place, for about 5 minutes. Then press the power button to start a reading. Do not talk or move while the reading is taking place.  Record your readings on a sheet of paper. Although most cuffs have a memory, it is often easier to see a pattern developing when the numbers are all in front of you.  You can repeat the reading after 1-3 minutes if it is recommended.   Make sure your bladder is empty and you have not had caffeine or tobacco within the last 30 minutes   Always bring your blood pressure log with you to your appointments. If you have not brought your monitor in to be double checked for accuracy, please bring it to your next appointment.   You can find a list of validated (accurate) blood pressure cuffs at: WirelessNovelties.no   GLP-1 Receptor Agonist Counseling Points This medication reduces your appetite and may make you feel fuller longer.  Stop eating when your body tells you that you are full. This will likely happen sooner than you are used to. Fried/greasy food and sweets may upset your stomach - minimize these as much as possible. Store your medication in  the fridge until you are ready to use it. Inject your medication in the fatty tissue of your lower abdominal area (2 inches away from belly button) or upper outer thigh. Rotate injection sites. Common side effects include: nausea, diarrhea/constipation, and heartburn, and are more likely to occur if you overeat. Stop your injection for 7 days prior to surgical procedures requiring anesthesia.  Dosing schedule:  We will touch base with you monthly over the phone. The medication can be increased in monthly intervals depending on tolerability and efficacy.  Tips for success: Write down the reasons why you want to lose weight and post it in a place where you'll see it often.  Start small and  work your way up. Keep in mind that it takes time to achieve goals, and small steps add up.  Any additional movements help to burn calories. Taking the stairs rather than the elevator and parking at the far end of your parking lot are easy ways to start. Brisk walking for at least 30 minutes 4 or more days of the week is an excellent goal to work toward  Understanding what it means to feel full: Did you know that it can take 15 minutes or more for your brain to receive the message that you've eaten? That means that, if you eat less food, but consume it slower, you may still feel satisfied.  Eating a lot of fruits and vegetables can also help you feel fuller.  Eat off of smaller plates so that moderate portions don't seem too small  Tips for living a healthier life     Building a Healthy and Balanced Diet Make most of your meal vegetables and fruits -  of your plate. Aim for color and variety, and remember that potatoes don't count as vegetables on the Healthy Eating Plate because of their negative impact on blood sugar.  Go for whole grains -  of your plate. Whole and intact grains--whole wheat, barley, wheat berries, quinoa, oats, brown rice, and foods made with them, such as whole wheat pasta--have a milder effect on blood sugar and insulin than white bread, white rice, and other refined grains.  Protein power -  of your plate. Fish, poultry, beans, and nuts are all healthy, versatile protein sources--they can be mixed into salads, and pair well with vegetables on a plate. Limit red meat, and avoid processed meats such as bacon and sausage.  Healthy plant oils - in moderation. Choose healthy vegetable oils like olive, canola, soy, corn, sunflower, peanut, and others, and avoid partially hydrogenated oils, which contain unhealthy trans fats. Remember that low-fat does not mean "healthy."  Drink water, coffee, or tea. Skip sugary drinks, limit milk and dairy products to one to two  servings per day, and limit juice to a small glass per day.  Stay active. The red figure running across the Healthy Eating Plate's placemat is a reminder that staying active is also important in weight control.  The main message of the Healthy Eating Plate is to focus on diet quality:  The type of carbohydrate in the diet is more important than the amount of carbohydrate in the diet, because some sources of carbohydrate--like vegetables (other than potatoes), fruits, whole grains, and beans--are healthier than others. The Healthy Eating Plate also advises consumers to avoid sugary beverages, a major source of calories--usually with little nutritional value--in the American diet. The Healthy Eating Plate encourages consumers to use healthy oils, and it does not set a maximum on the percentage  of calories people should get each day from healthy sources of fat. In this way, the Healthy Eating Plate recommends the opposite of the low-fat message promoted for decades by the USDA.  CueTune.com.ee  SUGAR  Sugar is a huge problem in the modern day diet. Sugar is a big contributor to heart disease, diabetes, high triglyceride levels, fatty liver disease and obesity. Sugar is hidden in almost all packaged foods/beverages. Added sugar is extra sugar that is added beyond what is naturally found and has no nutritional benefit for your body. The American Heart Association recommends limiting added sugars to no more than 25g for women and 36 grams for men per day. There are many names for sugar including maltose, sucrose (names ending in "ose"), high fructose corn syrup, molasses, cane sugar, corn sweetener, raw sugar, syrup, honey or fruit juice concentrate.   One of the best ways to limit your added sugars is to stop drinking sweetened beverages such as soda, sweet tea, and fruit juice.  There is 65g of added sugars in one 20oz bottle of Coke! That is equal  to 7.5 donuts.   Pay attention and read all nutrition facts labels. Below is an examples of a nutrition facts label. The #1 is showing you the total sugars where the # 2 is showing you the added sugars. This one serving has almost the max amount of added sugars per day!   EXERCISE  Exercise is good. We've all heard that. In an ideal world, we would all have time and resources to get plenty of it. When you are active, your heart pumps more efficiently and you will feel better.  Multiple studies show that even walking regularly has benefits that include living a longer life. The American Heart Association recommends 150 minutes per week of exercise (30 minutes per day most days of the week). You can do this in any increment you wish. Nine or more 10-minute walks count. So does an hour-long exercise class. Break the time apart into what will work in your life. Some of the best things you can do include walking briskly, jogging, cycling or swimming laps. Not everyone is ready to "exercise." Sometimes we need to start with just getting active. Here are some easy ways to be more active throughout the day:  Take the stairs instead of the elevator  Go for a 10-15 minute walk during your lunch break (find a friend to make it more enjoyable)  When shopping, park at the back of the parking lot  If you take public transportation, get off one stop early and walk the extra distance  Pace around while making phone calls  Check with your doctor if you aren't sure what your limitations may be. Always remember to drink plenty of water when doing any type of exercise. Don't feel like a failure if you're not getting the 90-150 minutes per week. If you started by being a couch potato, then just a 10-minute walk each day is a huge improvement. Start with little victories and work your way up.   HEALTHY EATING TIPS              Plan ahead: make a menu of the meals for a week then create a grocery list to go with that  menu. Consider meals that easily stretch into a night of leftovers, such as stews or casseroles. Or consider making two of your favorite meal and put one in the freezer for another night. Try a night or two  each week that is "meatless" or "no cook" such as salads. When you get home from the grocery store wash and prepare your vegetables and fruits. Then when you need them they are ready to go.   Tips for going to the grocery store:  Buy store or generic brands  Check the weekly ad from your store on-line or in their in-store flyer  Look at the unit price on the shelf tag to compare/contrast the costs of different items  Buy fruits/vegetables in season  Carrots, bananas and apples are low-cost, naturally healthy items  If meats or frozen vegetables are on sale, buy some extras and put in your freezer  Limit buying prepared or "ready to eat" items, even if they are pre-made salads or fruit snacks  Do not shop when you're hungry  Foods at eye level tend to be more expensive. Look on the high and low shelves for deals.  Consider shopping at the farmer's market for fresh foods in season.  Avoid the cookie and chip aisles (these are expensive, high in calories and low in nutritional value). Shop on the outside of the grocery store.  Healthy food preparations:  If you can't get lean hamburger, be sure to drain the fat when cooking  Steam, saut (in olive oil), grill or bake foods  Experiment with different seasonings to avoid adding salt to your foods. Kosher salt, sea salt and Himalayan salt are all still salt and should be avoided. Try seasoning food with onion, garlic, thyme, rosemary, basil ect. Onion powder or garlic powder is ok. Avoid if it says salt (ie garlic salt).

## 2023-06-03 ENCOUNTER — Telehealth: Payer: Self-pay | Admitting: Podiatry

## 2023-06-03 NOTE — Telephone Encounter (Signed)
DOS-06/29/23  TARSAL EXOSTECTOMY [id number]  [employer] EFFECTIVE DATE- 10/10/23  DEDUCTIBLE- $3000.00 WITH REMAINING $3000.00 OOP-$5000.00 WITH REMAINING $4775.75 COINSURANCE- 20%  PER THE [employer] PORTAL, PRIOR AUTH IS NOT REQUIRED FOR CPT CODE 84696.  AUTH Decision ID #: [id number]

## 2023-06-07 LAB — BASIC METABOLIC PANEL
BUN/Creatinine Ratio: 26 (ref 12–28)
BUN: 19 mg/dL (ref 8–27)
CO2: 21 mmol/L (ref 20–29)
Calcium: 9.6 mg/dL (ref 8.7–10.3)
Chloride: 101 mmol/L (ref 96–106)
Creatinine, Ser: 0.72 mg/dL (ref 0.57–1.00)
Glucose: 107 mg/dL — ABNORMAL HIGH (ref 70–99)
Potassium: 4.7 mmol/L (ref 3.5–5.2)
Sodium: 139 mmol/L (ref 134–144)
eGFR: 91 mL/min/{1.73_m2} (ref >59)

## 2023-06-08 ENCOUNTER — Encounter: Payer: Self-pay | Admitting: Pharmacist

## 2023-06-08 ENCOUNTER — Other Ambulatory Visit: Payer: Self-pay

## 2023-06-08 MED ORDER — OMEPRAZOLE 40 MG PO CPDR: 40.0000 mg | DELAYED_RELEASE_CAPSULE | Freq: Every day | ORAL | 0 refills | Status: DC

## 2023-06-10 LAB — HM MAMMOGRAPHY: HM Mammogram: NORMAL (ref 0–4)

## 2023-06-11 ENCOUNTER — Ambulatory Visit: Payer: 59 | Attending: Cardiology | Admitting: Pharmacist

## 2023-06-11 VITALS — BP 138/72 | HR 73

## 2023-06-11 DIAGNOSIS — I10 Essential (primary) hypertension: Secondary | ICD-10-CM

## 2023-06-11 MED ORDER — IRBESARTAN 300 MG PO TABS: 300.0000 mg | ORAL_TABLET | Freq: Every day | ORAL | 3 refills | Status: DC

## 2023-06-11 NOTE — Progress Notes (Unsigned)
Patient ID: Jamie Pennington                 DOB: 21-Jul-1955                    MRN: [id number]     HPI: Jamie Pennington is a 68 y.o. female patient referred to pharmacy clinic by Dr. Tenny Craw to initiate GLP1-RA therapy. PMH is significant for CAD s/p PCI, HTN, HLD, and obesity. Most recent BMI 34.47.  Patient last seen in clinic on 05/26/2023.  Blood pressure was 170/100 in clinic.  Irbesartan 150 mg daily was started.  Repeat BMP showed a stable serum creatinine of 0.72 and a potassium of 4.7.  She was also started on Wegovy.  Patient presents today to hypertension clinic for follow-up.  She denies any dizziness lightheadedness headaches blurred vision shortness of breath or swelling.  She is using free weights for exercises at home.  Brings in blood pressure readings that show improvement in blood pressure but still elevated.  Average at home 154/72.  She is checking her blood pressure in the morning before she leaves for work and is not resting very long.  Home blood pressure cuff was found to be accurate   Home cuff 139/66 Home cuff 137/63 Clinic cuff 138/72  Home blood pressure readings: 165 79  167 76  168 73  159 75  147 67  165 75  123 63  145 68  154.875 72     Baseline weight and BMI: 194, 34.4  Blood pressure medications: Amlodipine 5 mg twice daily, carvedilol 6.25 mg twice daily, irbesartan 150 mg daily Previously tried: Maxide(bump in serum creatinine), losartan, ramipril, metoprolol  Diet:  Breakfast: Bacon, eggs, toast or skips, black coffee Lunch: usually skips Dinner: chicken or hamburger w/ salad Snack: no Drink: water and grape juice (1 glass per day)   Exercise: limited by foot pain, weights  Family History:   The patient's family history includes Alzheimer's disease (age of onset: 59) in her mother; Heart attack in her sister; Stroke (age of onset: 74) in her father.   Social History: no ETOH, no tobacco  Labs: No results found for: "HGBA1C"  Wt Readings  from Last 1 Encounters:  05/26/23 194 lb 9.6 oz (88.3 kg)    BP Readings from Last 1 Encounters:  05/26/23 (!) 170/100   Pulse Readings from Last 1 Encounters:  05/26/23 85       Component Value Date/Time   CHOL 113 06/30/2022 0836   TRIG 133 06/30/2022 0836   HDL 40 06/30/2022 0836   CHOLHDL 2.8 06/30/2022 0836   CHOLHDL 3.6 06/20/2015 0849   VLDL 24 06/20/2015 0849   LDLCALC 50 06/30/2022 0836   LDLDIRECT 99 08/02/2012 0917    Past Medical History:  Diagnosis Date   COPD (chronic obstructive pulmonary disease) (HCC)    Coronary artery disease    GERD (gastroesophageal reflux disease)    Headache    "probably weekly" (02/24/2018)   Hepatitis 1972   "don't remember what kind" (02/24/2018)   Hyperlipidemia    Hypertension    Myocardial infarct (HCC) 2004   s/p stent placement    Pneumonia    "once" (02/24/2018)    Current Outpatient Medications on File Prior to Visit  Medication Sig Dispense Refill   acetaminophen (TYLENOL) 500 MG tablet Take 1,000 mg by mouth every 6 (six) hours as needed for headache.     albuterol (VENTOLIN HFA) 108 (90 Base) MCG/ACT inhaler Inhale  2 puffs into the lungs as needed (Wheezing). 8 g 6   amLODipine (NORVASC) 5 MG tablet Take 1 tablet (5 mg total) by mouth in the morning and at bedtime. 180 tablet 3   aspirin 81 MG tablet Take 81 mg by mouth daily. Reported on 11/27/2015     atorvastatin (LIPITOR) 80 MG tablet TAKE ONE TABLET BY MOUTH ONE TIME DAILY 90 tablet 3   benzonatate (TESSALON) 200 MG capsule Take 1 capsule (200 mg total) by mouth 3 (three) times daily as needed. (Patient not taking: Reported on 05/26/2023) 45 capsule 1   carvedilol (COREG) 6.25 MG tablet Take 1 tablet (6.25 mg total) by mouth 2 (two) times daily. 180 tablet 3   diclofenac (VOLTAREN) 75 MG EC tablet Take 1 tablet (75 mg total) by mouth 2 (two) times daily. (Patient not taking: Reported on 05/26/2023) 50 tablet 2   Evolocumab (REPATHA SURECLICK) 140 MG/ML SOAJ  INJECT 140MG  INTO THE SKIN EVERY 14 DAYS 2 mL 11   ezetimibe (ZETIA) 10 MG tablet TAKE ONE TABLET BY MOUTH ONE TIME DAILY. **PT MUST KEEP UPCOMING APPOINTMENT IN NOVEMBER WITH CARDILOGOSIT BEFORE ANYMORE REFILLS.** 90 tablet 3   famotidine (PEPCID) 40 MG tablet TAKE 1 TABLET(40 MG) BY MOUTH DAILY 90 tablet 3   Fluticasone-Umeclidin-Vilant (TRELEGY ELLIPTA) 100-62.5-25 MCG/ACT AEPB INHALE ONE PUFF BY MOUTH ONE TIME DAILY 60 each 11   irbesartan (AVAPRO) 150 MG tablet Take 1 tablet (150 mg total) by mouth daily. 90 tablet 3   loratadine (CLARITIN) 10 MG tablet Take 10 mg by mouth daily.     montelukast (SINGULAIR) 10 MG tablet TAKE ONE TABLET BY MOUTH ONE TIME DAILY AT BEDTIME 90 tablet 0   Multiple Vitamin (MULTIVITAMIN) tablet Take 1 tablet by mouth daily.     nitroGLYCERIN (NITROSTAT) 0.4 MG SL tablet Place 0.4 mg under the tongue every 5 (five) minutes as needed for chest pain (MAX 3 TABLETS).     omeprazole (PRILOSEC) 40 MG capsule Take 1 capsule (40 mg total) by mouth daily. 90 capsule 0   Semaglutide-Weight Management (WEGOVY) 0.25 MG/0.5ML SOAJ Inject 0.25 mg into the skin once a week. (Patient not taking: Reported on 05/26/2023) 2 mL 1   Zoster Vaccine Adjuvanted [facility]) injection Inject 0.5 ml IM and Repeat in 2 months (Patient not taking: Reported on 03/16/2023) 0.5 mL 1   No current facility-administered medications on file prior to visit.    No Known Allergies   Assessment/Plan:  1. Weight loss -Patient doing well on Wegovy.  She will take her third dose today.  She is ready to increase to 0.5 mg when she completes the 0.25.   2. HTN  Assessment: Blood pressure elevated in clinic but significantly improved from previous visit Home BP has been high as well but patient not resting much prior to checking Reports compliance with medications Doing some free weight exercises Labs stable after the addition of irbesartan  Plan: Increase irbesartan to 300 mg daily Continue  amlodipine 5mg  twice a day and carvedilol 6.25mg  twice a day Labs in 2 weeks Follow up in office 3/18   Thank you,  Olene Floss, Pharm.D, BCACP, CPP [facility] Division of [facility] [address] [place], [place] [address]  Phone: [phone]; Fax: [phone]

## 2023-06-11 NOTE — Patient Instructions (Addendum)
Your blood pressure goal is < 130/78mmHg   Please increase your irbesartan to 300mg  daily Continue amlodipine 5 mg twice daily, carvedilol 6.25 mg twice daily   Important lifestyle changes to control high blood pressure  Intervention  Effect on the BP   Weight loss Weight loss is one of the most effective lifestyle changes for controlling blood pressure. If you're overweight or obese, losing even a small amount of weight can help reduce blood pressure.    Blood pressure can decrease by 1 millimeter of mercury (mmHg) with each kilogram (about 2.2 pounds) of weight lost.   Exercise regularly As a general goal, aim for 30 minutes of moderate physical activity every day.    Regular physical activity can lower blood pressure by 5 - 8 mmHg.   Eat a healthy diet Eat a diet rich in whole grains, fruits, vegetables, lean meat, and low-fat dairy products. Limit processed foods, saturated fat, and sweets.    A heart-healthy diet can lower high blood pressure by 10 mmHg.   Reduce salt (sodium) in your diet Aim for 000mg  of sodium each day. Avoid deli meats, canned food, and frozen microwave meals which are high in sodium.     Limiting sodium can reduce blood pressure by 5 mmHg.   Limit alcohol One drink equals 12 ounces of beer, 5 ounces of wine, or 1.5 ounces of 80-proof liquor.    Limiting alcohol to < 1 drink a day for women or < 2 drinks a day for men can help lower blood pressure by about 4 mmHg.   To check your pressure at home you will need to:   Sit up in a chair, with feet flat on the floor and back supported. Do not cross your ankles or legs. Rest your left arm so that the cuff is about heart level. If the cuff goes on your upper arm, then just relax your arm on the table, arm of the chair, or your lap. If you have a wrist cuff, hold your wrist against your chest at heart level. Place the cuff snugly around your arm, about 1 inch above the crease of your elbow. The cords  should be inside the groove of your elbow.  Sit quietly, with the cuff in place, for about 5 minutes. Then press the power button to start a reading. Do not talk or move while the reading is taking place.  Record your readings on a sheet of paper. Although most cuffs have a memory, it is often easier to see a pattern developing when the numbers are all in front of you.  You can repeat the reading after 1-3 minutes if it is recommended.   Make sure your bladder is empty and you have not had caffeine or tobacco within the last 30 minutes   Always bring your blood pressure log with you to your appointments. If you have not brought your monitor in to be double checked for accuracy, please bring it to your next appointment.   You can find a list of validated (accurate) blood pressure cuffs at: validatebp.org

## 2023-06-17 ENCOUNTER — Other Ambulatory Visit: Payer: Self-pay | Admitting: Pharmacist

## 2023-06-17 MED ORDER — WEGOVY 0.5 MG/0.5ML ~~LOC~~ SOAJ: 0.5000 mg | SUBCUTANEOUS | 0 refills | Status: DC

## 2023-06-26 LAB — BASIC METABOLIC PANEL
BUN/Creatinine Ratio: 18 (ref 12–28)
BUN: 14 mg/dL (ref 8–27)
CO2: 23 mmol/L (ref 20–29)
Calcium: 9.3 mg/dL (ref 8.7–10.3)
Chloride: 103 mmol/L (ref 96–106)
Creatinine, Ser: 0.77 mg/dL (ref 0.57–1.00)
Glucose: 140 mg/dL — ABNORMAL HIGH (ref 70–99)
Potassium: 4.6 mmol/L (ref 3.5–5.2)
Sodium: 140 mmol/L (ref 134–144)
eGFR: 84 mL/min/{1.73_m2} (ref >59)

## 2023-06-28 ENCOUNTER — Encounter: Payer: Self-pay | Admitting: Family Medicine

## 2023-06-28 ENCOUNTER — Encounter: Payer: Self-pay | Admitting: Pharmacist

## 2023-06-28 MED ORDER — HYDROCODONE-ACETAMINOPHEN 10-325 MG PO TABS: 1.0000 | ORAL_TABLET | Freq: Three times a day (TID) | ORAL | 0 refills | Status: AC | PRN

## 2023-06-28 NOTE — Addendum Note (Signed)
Addended by: Lenn Sink on: 06/28/2023 04:41 PM   Modules accepted: Orders

## 2023-06-29 DIAGNOSIS — M25774 Osteophyte, right foot: Secondary | ICD-10-CM | POA: Diagnosis not present

## 2023-06-30 MED ORDER — LORATADINE 10 MG PO TABS: 10.0000 mg | ORAL_TABLET | Freq: Every day | ORAL | 0 refills | Status: DC

## 2023-07-05 ENCOUNTER — Encounter: Payer: 59 | Admitting: Podiatry

## 2023-07-05 ENCOUNTER — Ambulatory Visit (INDEPENDENT_AMBULATORY_CARE_PROVIDER_SITE_OTHER): Payer: 59

## 2023-07-05 ENCOUNTER — Encounter: Payer: Self-pay | Admitting: Podiatry

## 2023-07-05 ENCOUNTER — Ambulatory Visit (INDEPENDENT_AMBULATORY_CARE_PROVIDER_SITE_OTHER): Payer: 59 | Admitting: Podiatry

## 2023-07-05 DIAGNOSIS — Z9889 Other specified postprocedural states: Secondary | ICD-10-CM

## 2023-07-05 DIAGNOSIS — M779 Enthesopathy, unspecified: Secondary | ICD-10-CM | POA: Diagnosis not present

## 2023-07-05 NOTE — Progress Notes (Signed)
 Subjective:   Patient ID: Jamie Pennington, female   DOB: 68 y.o.   MRN: [id number]   HPI Patient states doing very well with surgery very pleased   ROS      Objective:  Physical Exam  Neurovascular status intact negative Denna Haggard' sign noted wound edges coapted well incision site doing well     Assessment:  Doing well removal of bone spur first metatarsocuneiform joint right     Plan:  H&P x-ray reviewed and reapplied sterile dressing continue elevation compression immobilization reappoint next few weeks to recheck  X-rays do indicate reduction in size there still is some spurring but I think that is probably occurring more across the lateral side and was not part of the pathology

## 2023-07-07 ENCOUNTER — Other Ambulatory Visit: Payer: Self-pay

## 2023-07-08 ENCOUNTER — Encounter: Payer: Self-pay | Admitting: Pharmacist

## 2023-07-08 MED ORDER — WEGOVY 1 MG/0.5ML ~~LOC~~ SOAJ: 1.0000 mg | SUBCUTANEOUS | 0 refills | Status: DC

## 2023-07-08 MED ORDER — FAMOTIDINE 40 MG PO TABS: ORAL_TABLET | ORAL | 3 refills | Status: DC

## 2023-07-19 ENCOUNTER — Encounter: Payer: 59 | Admitting: Podiatry

## 2023-07-27 ENCOUNTER — Ambulatory Visit: Payer: 59 | Attending: Cardiology | Admitting: Pharmacist

## 2023-07-27 VITALS — BP 136/78 | HR 80 | Wt 180.0 lb

## 2023-07-27 DIAGNOSIS — E66811 Obesity, class 1: Secondary | ICD-10-CM

## 2023-07-27 DIAGNOSIS — I10 Essential (primary) hypertension: Secondary | ICD-10-CM | POA: Diagnosis not present

## 2023-07-27 NOTE — Patient Instructions (Addendum)
 Your blood pressure goal is < 130/56mmHg   Continue Amlodipine 5 mg twice daily, carvedilol 6.25 mg twice daily, irbesartan 300mg  daily  If you get a high reading, rest an extra few min and recheck   Important lifestyle changes to control high blood pressure  Intervention  Effect on the BP   Weight loss Weight loss is one of the most effective lifestyle changes for controlling blood pressure. If you're overweight or obese, losing even a small amount of weight can help reduce blood pressure.    Blood pressure can decrease by 1 millimeter of mercury (mmHg) with each kilogram (about 2.2 pounds) of weight lost.   Exercise regularly As a general goal, aim for 30 minutes of moderate physical activity every day.    Regular physical activity can lower blood pressure by 5 - 8 mmHg.   Eat a healthy diet Eat a diet rich in whole grains, fruits, vegetables, lean meat, and low-fat dairy products. Limit processed foods, saturated fat, and sweets.    A heart-healthy diet can lower high blood pressure by 10 mmHg.   Reduce salt (sodium) in your diet Aim for 000mg  of sodium each day. Avoid deli meats, canned food, and frozen microwave meals which are high in sodium.     Limiting sodium can reduce blood pressure by 5 mmHg.   Limit alcohol One drink equals 12 ounces of beer, 5 ounces of wine, or 1.5 ounces of 80-proof liquor.    Limiting alcohol to < 1 drink a day for women or < 2 drinks a day for men can help lower blood pressure by about 4 mmHg.   To check your pressure at home you will need to:   Sit up in a chair, with feet flat on the floor and back supported. Do not cross your ankles or legs. Rest your left arm so that the cuff is about heart level. If the cuff goes on your upper arm, then just relax your arm on the table, arm of the chair, or your lap. If you have a wrist cuff, hold your wrist against your chest at heart level. Place the cuff snugly around your arm, about 1 inch  above the crease of your elbow. The cords should be inside the groove of your elbow.  Sit quietly, with the cuff in place, for about 5 minutes. Then press the power button to start a reading. Do not talk or move while the reading is taking place.  Record your readings on a sheet of paper. Although most cuffs have a memory, it is often easier to see a pattern developing when the numbers are all in front of you.  You can repeat the reading after 1-3 minutes if it is recommended.   Make sure your bladder is empty and you have not had caffeine or tobacco within the last 30 minutes   Always bring your blood pressure log with you to your appointments. If you have not brought your monitor in to be double checked for accuracy, please bring it to your next appointment.   You can find a list of validated (accurate) blood pressure cuffs at: validatebp.org

## 2023-07-27 NOTE — Progress Notes (Signed)
 Patient ID: Jamie Pennington                 DOB: [birth year]                    MRN: [id number]     HPI: Jamie Pennington is a 68 y.o. female patient referred to pharmacy clinic by Dr. Tenny Craw to initiate GLP1-RA therapy. PMH is significant for CAD s/p PCI, HTN, HLD, and obesity. Most recent BMI 34.47.  Patient last seen in clinic on 05/26/2023.  Blood pressure was 170/100 in clinic.  Irbesartan 150 mg daily was started.  Repeat BMP showed a stable serum creatinine of 0.72 and a potassium of 4.7.  She was also started on Wegovy.  At last visit 06/11/23 irbesartan was increased to 300 mg daily.  Repeat BMP stable.   Patient presents today for follow-up.  She had foot surgery in mid February and stopped checking her blood pressure for a few weeks.  She brings in home blood pressure readings which range from 122-155/61-79.  She states that she has been having headaches on a daily basis for the last week or 2.  They go away with Tylenol and then come back.  Does have increased sensitivity to allergies.  Also reports increased fatigue.  Unsure if this is due to the fact that she has been out of work for about a month. Denies any lightheadedness or swelling.  Also reports having some vision issues and needing cataract surgery.  Weight down to 180 pounds.  She will start Wegovy 1 mg soon.  Home blood pressure cuff previously was found to be accurate.    Home cuff 139/66 Home cuff 137/63 Clinic cuff 138/72  Home blood pressure readings: 134 61  148 73  148 70  122 58  133 71  155 79  125 66  150 78  139.375 69.5    Baseline weight and BMI: 194, 34.4  Blood pressure medications: Amlodipine 5 mg twice daily, carvedilol 6.25 mg twice daily, irbesartan 300mg  daily Previously tried: Maxide (bump in serum creatinine), losartan, ramipril, metoprolol  Diet:  Breakfast: Bacon, eggs, toast or skips, black coffee Lunch: usually skips Dinner: chicken or hamburger w/ salad Snack: no Drink: water and grape  juice (1 glass per day)   Exercise: limited by foot pain, weights  Family History:   The patient's family history includes Alzheimer's disease (age of onset: 59) in her mother; Heart attack in her sister; Stroke (age of onset: 26) in her father.   Social History: no ETOH, no tobacco  Labs: No results found for: "HGBA1C"  Wt Readings from Last 1 Encounters:  05/26/23 194 lb 9.6 oz (88.3 kg)    BP Readings from Last 1 Encounters:  06/11/23 138/72   Pulse Readings from Last 1 Encounters:  06/11/23 73       Component Value Date/Time   CHOL 113 06/30/2022 0836   TRIG 133 06/30/2022 0836   HDL 40 06/30/2022 0836   CHOLHDL 2.8 06/30/2022 0836   CHOLHDL 3.6 06/20/2015 0849   VLDL 24 06/20/2015 0849   LDLCALC 50 06/30/2022 0836   LDLDIRECT 99 08/02/2012 0917    Past Medical History:  Diagnosis Date   COPD (chronic obstructive pulmonary disease) (HCC)    Coronary artery disease    GERD (gastroesophageal reflux disease)    Headache    "probably weekly" (02/24/2018)   Hepatitis 1972   "don't remember what kind" (02/24/2018)   Hyperlipidemia    Hypertension  Myocardial infarct [facility]) 2004   s/p stent placement    Pneumonia    "once" (02/24/2018)    Current Outpatient Medications on File Prior to Visit  Medication Sig Dispense Refill   acetaminophen (TYLENOL) 500 MG tablet Take 1,000 mg by mouth every 6 (six) hours as needed for headache.     albuterol (VENTOLIN HFA) 108 (90 Base) MCG/ACT inhaler Inhale 2 puffs into the lungs as needed (Wheezing). 8 g 6   amLODipine (NORVASC) 5 MG tablet Take 1 tablet (5 mg total) by mouth in the morning and at bedtime. 180 tablet 3   aspirin 81 MG tablet Take 81 mg by mouth daily. Reported on 11/27/2015     atorvastatin (LIPITOR) 80 MG tablet TAKE ONE TABLET BY MOUTH ONE TIME DAILY 90 tablet 3   carvedilol (COREG) 6.25 MG tablet Take 1 tablet (6.25 mg total) by mouth 2 (two) times daily. 180 tablet 3   diclofenac (VOLTAREN) 75 MG EC  tablet Take 1 tablet (75 mg total) by mouth 2 (two) times daily. 50 tablet 2   Evolocumab (REPATHA SURECLICK) 140 MG/ML SOAJ INJECT 140MG  INTO THE SKIN EVERY 14 DAYS 2 mL 11   ezetimibe (ZETIA) 10 MG tablet TAKE ONE TABLET BY MOUTH ONE TIME DAILY. **PT MUST KEEP UPCOMING APPOINTMENT IN NOVEMBER WITH CARDILOGOSIT BEFORE ANYMORE REFILLS.** 90 tablet 3   famotidine (PEPCID) 40 MG tablet TAKE 1 TABLET(40 MG) BY MOUTH DAILY 90 tablet 3   Fluticasone-Umeclidin-Vilant (TRELEGY ELLIPTA) 100-62.5-25 MCG/ACT AEPB INHALE ONE PUFF BY MOUTH ONE TIME DAILY 60 each 11   irbesartan (AVAPRO) 300 MG tablet Take 1 tablet (300 mg total) by mouth daily. 90 tablet 3   loratadine (CLARITIN) 10 MG tablet Take 1 tablet (10 mg total) by mouth daily. 30 tablet 0   montelukast (SINGULAIR) 10 MG tablet TAKE ONE TABLET BY MOUTH ONE TIME DAILY AT BEDTIME 90 tablet 0   Multiple Vitamin (MULTIVITAMIN) tablet Take 1 tablet by mouth daily.     nitroGLYCERIN (NITROSTAT) 0.4 MG SL tablet Place 0.4 mg under the tongue every 5 (five) minutes as needed for chest pain (MAX 3 TABLETS).     omeprazole (PRILOSEC) 40 MG capsule Take 1 capsule (40 mg total) by mouth daily. 90 capsule 0   Semaglutide-Weight Management (WEGOVY) 1 MG/0.5ML SOAJ Inject 1 mg into the skin once a week. 2 mL 0   Zoster Vaccine Adjuvanted [facility]) injection Inject 0.5 ml IM and Repeat in 2 months 0.5 mL 1   No current facility-administered medications on file prior to visit.    No Known Allergies   Assessment/Plan:  1. Weight loss -Patient doing well on Wegovy.  Per chart has lost at least 14 pounds.  Will start 1 mg soon  2. HTN  Assessment: Blood pressure slightly elevated in clinic  Home readings vary She does not recheck her blood pressure if first reading is high Reports compliance with medications Had surgery mid February on her foot therefore has not been as active  Plan: Continue amlodipine 5mg  twice a day, carvedilol 6.25mg  twice a day and  irbesartan 300 mg daily Continue to work on weight loss and increase exercise If blood pressure elevated on first check patient advised to rest a few extra minutes and recheck Follow-up in 2 months on 5/20  Thank you,  Kristee Angus D Amaiyah Nordhoff, Pharm.D, BCACP, CPP [facility] Division of [facility] [address] [place], [place] [address]  Phone: [phone]; Fax: [phone]

## 2023-08-02 ENCOUNTER — Other Ambulatory Visit: Payer: Self-pay | Admitting: Pulmonary Disease

## 2023-08-04 ENCOUNTER — Other Ambulatory Visit: Payer: Self-pay

## 2023-08-04 MED ORDER — LORATADINE 10 MG PO TABS: 10.0000 mg | ORAL_TABLET | Freq: Every day | ORAL | 0 refills | Status: AC

## 2023-08-17 ENCOUNTER — Encounter: Payer: Self-pay | Admitting: Pharmacist

## 2023-08-17 MED ORDER — WEGOVY 1.7 MG/0.75ML ~~LOC~~ SOAJ: 1.7000 mg | SUBCUTANEOUS | 0 refills | Status: DC

## 2023-09-15 MED ORDER — WEGOVY 2.4 MG/0.75ML ~~LOC~~ SOAJ: 2.4000 mg | SUBCUTANEOUS | 11 refills | Status: AC

## 2023-09-15 NOTE — Addendum Note (Signed)
 Addended by: Aixa Corsello D on: 09/15/2023 12:56 PM   Modules accepted: Orders

## 2023-09-28 ENCOUNTER — Ambulatory Visit: Attending: Cardiovascular Disease | Admitting: Pharmacist

## 2023-09-28 VITALS — BP 114/78 | HR 90 | Wt 166.0 lb

## 2023-09-28 DIAGNOSIS — I10 Essential (primary) hypertension: Secondary | ICD-10-CM

## 2023-09-28 DIAGNOSIS — E66811 Obesity, class 1: Secondary | ICD-10-CM

## 2023-09-28 NOTE — Patient Instructions (Addendum)
 Continue taking amlodipine  5 mg twice daily, carvedilol  6.25 mg twice daily, irbesartan  300mg  daily  Continue walking and doing your weight exercises  Please call me if blood pressure is above 130/80 fairly consistently

## 2023-09-28 NOTE — Progress Notes (Signed)
 Patient ID: Jamie Pennington                 DOB: October 19, 1955                    MRN: [id number]     HPI: Jamie Pennington is a 68 y.o. female patient referred to pharmacy clinic by Dr. Avanell Bob to initiate GLP1-RA therapy. PMH is significant for CAD s/p PCI, HTN, HLD, and obesity. Most recent BMI 34.47.  Patient last seen in clinic on 05/26/2023.  Blood pressure was 170/100 in clinic.  Irbesartan  150 mg daily was started.  Repeat BMP showed a stable serum creatinine of 0.72 and a potassium of 4.7.  She was also started on Wegovy .  At last visit 06/11/23 irbesartan  was increased to 300 mg daily.  Repeat BMP stable.  At last visit 07/27/23 blood pressure was 136/78 in clinic. Home readings mostly above goal. She was encouraged to work on weight-loss (on Wegovy ) and increase exercise.   Patient presents today to Pharm.D. clinic.  She is now on Wegovy  2.4 mg weekly.  Is down to 166 pounds-baseline weight was 194.  Blood pressure averaging 132/66 at home.  Blood pressure today in clinic 122/80 and 114/70 and repeat.  Denies any dizziness or lightheadedness or headaches.  She is walking about every other day on her treadmill (as her foot allows) and also doing exercises with light weights.  Home blood pressure cuff previously was found to be accurate.  Home cuff 139/66 Home cuff 137/63 Clinic cuff 138/72  Home blood pressure readings: 128 64 136 71 130 67 128 67 129 62  140 60 132 72 136 69 132.375 66.5  Baseline weight and BMI: 194, 34.4  Blood pressure medications: Amlodipine  5 mg twice daily, carvedilol  6.25 mg twice daily, irbesartan  300mg  daily Previously tried: Maxide (bump in serum creatinine), losartan , ramipril , metoprolol   Diet:  Breakfast: fruit, egg, toast Lunch: yogurt and [place], left over chicken Dinner: stuff shells once a week, grilled chicken nuggests, turkey - catelope, watermellon, mix greens Snack: no Drink: water and grape juice (1 glass per day)   Exercise: weights,  walking every other day for 30 min, walks at work a lot  Family History:   The patient's family history includes Alzheimer's disease (age of onset: 22) in her mother; Heart attack in her sister; Stroke (age of onset: 37) in her father.   Social History: no ETOH, no tobacco  Labs: No results found for: "HGBA1C"  Wt Readings from Last 1 Encounters:  07/27/23 180 lb (81.6 kg)    BP Readings from Last 1 Encounters:  07/27/23 136/78   Pulse Readings from Last 1 Encounters:  07/27/23 80       Component Value Date/Time   CHOL 113 06/30/2022 0836   TRIG 133 06/30/2022 0836   HDL 40 06/30/2022 0836   CHOLHDL 2.8 06/30/2022 0836   CHOLHDL 3.6 06/20/2015 0849   VLDL 24 06/20/2015 0849   LDLCALC 50 06/30/2022 0836   LDLDIRECT 99 08/02/2012 0917    Past Medical History:  Diagnosis Date   COPD (chronic obstructive pulmonary disease) (HCC)    Coronary artery disease    GERD (gastroesophageal reflux disease)    Headache    "probably weekly" (02/24/2018)   Hepatitis 1972   "don't remember what kind" (02/24/2018)   Hyperlipidemia    Hypertension    Myocardial infarct (HCC) 2004   s/p stent placement    Pneumonia    "once" (02/24/2018)  Current Outpatient Medications on File Prior to Visit  Medication Sig Dispense Refill   acetaminophen  (TYLENOL ) 500 MG tablet Take 1,000 mg by mouth every 6 (six) hours as needed for headache.     albuterol  (VENTOLIN  HFA) 108 (90 Base) MCG/ACT inhaler Inhale 2 puffs into the lungs as needed (Wheezing). 8 g 6   amLODipine  (NORVASC ) 5 MG tablet Take 1 tablet (5 mg total) by mouth in the morning and at bedtime. 180 tablet 3   aspirin  81 MG tablet Take 81 mg by mouth daily. Reported on 11/27/2015     atorvastatin  (LIPITOR) 80 MG tablet TAKE ONE TABLET BY MOUTH ONE TIME DAILY 90 tablet 3   carvedilol  (COREG ) 6.25 MG tablet Take 1 tablet (6.25 mg total) by mouth 2 (two) times daily. 180 tablet 3   diclofenac  (VOLTAREN ) 75 MG EC tablet Take 1 tablet  (75 mg total) by mouth 2 (two) times daily. 50 tablet 2   Evolocumab  (REPATHA  SURECLICK) 140 MG/ML SOAJ INJECT 140MG  INTO THE SKIN EVERY 14 DAYS 2 mL 11   ezetimibe  (ZETIA ) 10 MG tablet TAKE ONE TABLET BY MOUTH ONE TIME DAILY. **PT MUST KEEP UPCOMING APPOINTMENT IN NOVEMBER WITH CARDILOGOSIT BEFORE ANYMORE REFILLS.** 90 tablet 3   famotidine  (PEPCID ) 40 MG tablet TAKE 1 TABLET(40 MG) BY MOUTH DAILY 90 tablet 3   Fluticasone -Umeclidin-Vilant (TRELEGY ELLIPTA ) 100-62.5-25 MCG/ACT AEPB INHALE ONE PUFF BY MOUTH ONE TIME DAILY 60 each 11   irbesartan  (AVAPRO ) 300 MG tablet Take 1 tablet (300 mg total) by mouth daily. 90 tablet 3   loratadine  (CLARITIN ) 10 MG tablet Take 1 tablet (10 mg total) by mouth daily. 30 tablet 0   montelukast  (SINGULAIR ) 10 MG tablet TAKE ONE TABLET BY MOUTH ONE TIME DAILY AT BEDTIME 90 tablet 0   Multiple Vitamin (MULTIVITAMIN) tablet Take 1 tablet by mouth daily.     nitroGLYCERIN  (NITROSTAT ) 0.4 MG SL tablet Place 0.4 mg under the tongue every 5 (five) minutes as needed for chest pain (MAX 3 TABLETS).     omeprazole  (PRILOSEC) 40 MG capsule Take 1 capsule (40 mg total) by mouth daily. 90 capsule 0   Semaglutide -Weight Management (WEGOVY ) 2.4 MG/0.75ML SOAJ Inject 2.4 mg into the skin once a week. 3 mL 11   Zoster Vaccine Adjuvanted [facility]) injection Inject 0.5 ml IM and Repeat in 2 months 0.5 mL 1   No current facility-administered medications on file prior to visit.    No Known Allergies   Assessment/Plan:  1. Weight loss -Patient doing well on Wegovy .  Per chart has lost 28 pounds.  Continue Wegovy  2.4 mg weekly.  2. HTN  Assessment: Blood pressure well-controlled in clinic Home readings averaging 132/66 Averages based off of 8 readings Reports compliance with medications Has increased her exercise  Plan: Continue amlodipine  5mg  twice a day, carvedilol  6.25mg  twice a day and irbesartan  300 mg daily Continue to work on weight loss and increase  exercise Follow-up as needed  Thank you,  Ryna Beckstrom D Braley Luckenbaugh, Pharm.Monika Annas, CPP [facility] [address], [place], [place] [address]  Phone: [phone]; Fax: [phone]

## 2023-10-19 ENCOUNTER — Encounter: Payer: Self-pay | Admitting: *Deleted

## 2023-10-27 ENCOUNTER — Other Ambulatory Visit: Payer: Self-pay | Admitting: Pulmonary Disease

## 2023-11-29 ENCOUNTER — Other Ambulatory Visit: Payer: Self-pay

## 2023-11-29 MED ORDER — OMEPRAZOLE 40 MG PO CPDR: 40.0000 mg | DELAYED_RELEASE_CAPSULE | Freq: Every day | ORAL | 0 refills | Status: DC

## 2023-11-29 NOTE — Telephone Encounter (Signed)
 Patient stated that she is not due for a physical until November.  Patient stated that she will wait until November because she don't think her insurance will pay any extra visits.

## 2023-11-29 NOTE — Telephone Encounter (Signed)
 Please schedule visit with PCP, has not been to the clinic in a while.

## 2023-12-28 ENCOUNTER — Telehealth: Payer: Self-pay

## 2023-12-28 MED ORDER — DICLOFENAC SODIUM 75 MG PO TBEC: 75.0000 mg | DELAYED_RELEASE_TABLET | Freq: Two times a day (BID) | ORAL | 2 refills | Status: AC

## 2023-12-28 NOTE — Telephone Encounter (Signed)
Faxed refill request   

## 2024-01-25 ENCOUNTER — Other Ambulatory Visit: Payer: Self-pay | Admitting: Pulmonary Disease

## 2024-01-25 MED ORDER — MONTELUKAST SODIUM 10 MG PO TABS: 10.0000 mg | ORAL_TABLET | Freq: Every day | ORAL | 0 refills | Status: DC

## 2024-01-25 NOTE — Telephone Encounter (Signed)
 Copied from CRM [id number]. Topic: Clinical - Medication Refill >> Jan 25, 2024 10:56 AM Ismael A wrote: Medication: montelukast  (SINGULAIR ) 10 MG tablet  Has the patient contacted their pharmacy? Yes (Agent: If no, request that the patient contact the pharmacy for the refill. If patient does not wish to contact the pharmacy document the reason why and proceed with request.) (Agent: If yes, when and what did the pharmacy advise?)  This is the patient's preferred pharmacy:   [facility] [address] [place] [facility] [place], [place] - [address] AT [place]  [address] [place] [address] Phone: [phone] Fax: [phone]  Is this the correct pharmacy for this prescription? Yes If no, delete pharmacy and type the correct one.   Has the prescription been filled recently? No  Is the patient out of the medication? No - has 7 tablets left  Has the patient been seen for an appointment in the last year OR does the patient have an upcoming appointment? Yes  Can we respond through MyChart? Yes  Agent: Please be advised that Rx refills may take up to 3 business days. We ask that you follow-up with your pharmacy.

## 2024-02-16 ENCOUNTER — Encounter: Payer: Self-pay | Admitting: Pharmacist

## 2024-02-22 ENCOUNTER — Other Ambulatory Visit: Payer: Self-pay

## 2024-02-22 NOTE — Telephone Encounter (Signed)
 Chart reviewed, appt scheduled 11/6  -[name], MD

## 2024-02-22 NOTE — Progress Notes (Signed)
 Cardiology Office Note   Date:  02/25/2024   ID:  [name], DOB [birth year], MRN [id number]  PCP:  Lorrane Pac, MD  Cardiologist:   Vina Gull, MD   Pt presents for follow up of CAD   History of Present Illness: Jamie Pennington is a 68 y.o. female with a history of CAD, HTN, COPD, HL    2004   s/p IWMI   Stent to RCA  2016    LHC   Patent stent to RCA   minimal dz elsewhere   LVEF normal 2019 Echo   Mod LVH   LVEF 60 to 65%   Mild TR  I saw the pt in Nov 2024  BP was elevated at that time  Maxzide started   Stopped due to bump in Cr    REferred to PharmD for titration of meds  Last seen by CHRISTELLA Crews in March 2025  SBP 136/ at the time  Labile at home   The pt says she feels good   BP at home 110s to 140/50s to 70s    Denies CP  Breathing is good   Walking   Active  No palpitations  No dizziness  Does complain of hair loss that got worse in summer time   Current Meds  Medication Sig   acetaminophen  (TYLENOL ) 500 MG tablet Take 1,000 mg by mouth every 6 (six) hours as needed for headache.   albuterol  (VENTOLIN  HFA) 108 (90 Base) MCG/ACT inhaler Inhale 2 puffs into the lungs as needed (Wheezing).   amLODipine  (NORVASC ) 5 MG tablet Take 1 tablet (5 mg total) by mouth in the morning and at bedtime.   aspirin  81 MG tablet Take 81 mg by mouth daily. Reported on 11/27/2015   atorvastatin  (LIPITOR) 80 MG tablet TAKE ONE TABLET BY MOUTH ONE TIME DAILY   carvedilol  (COREG ) 6.25 MG tablet Take 1 tablet (6.25 mg total) by mouth 2 (two) times daily.   diclofenac  (VOLTAREN ) 75 MG EC tablet Take 1 tablet (75 mg total) by mouth 2 (two) times daily.   Evolocumab  (REPATHA  SURECLICK) 140 MG/ML SOAJ INJECT 140MG  INTO THE SKIN EVERY 14 DAYS   ezetimibe  (ZETIA ) 10 MG tablet TAKE ONE TABLET BY MOUTH ONE TIME DAILY. **PT MUST KEEP UPCOMING APPOINTMENT IN NOVEMBER WITH CARDILOGOSIT BEFORE ANYMORE REFILLS.**   famotidine  (PEPCID ) 40 MG tablet TAKE 1 TABLET(40 MG) BY MOUTH DAILY    Fluticasone -Umeclidin-Vilant (TRELEGY ELLIPTA ) 100-62.5-25 MCG/ACT AEPB INHALE ONE PUFF BY MOUTH ONE TIME DAILY   irbesartan  (AVAPRO ) 300 MG tablet Take 1 tablet (300 mg total) by mouth daily.   loratadine  (CLARITIN ) 10 MG tablet Take 1 tablet (10 mg total) by mouth daily.   montelukast  (SINGULAIR ) 10 MG tablet Take 1 tablet (10 mg total) by mouth at bedtime.   Multiple Vitamin (MULTIVITAMIN) tablet Take 1 tablet by mouth daily.   nitroGLYCERIN  (NITROSTAT ) 0.4 MG SL tablet Place 0.4 mg under the tongue every 5 (five) minutes as needed for chest pain (MAX 3 TABLETS).   omeprazole  (PRILOSEC) 40 MG capsule TAKE ONE CAPSULE BY MOUTH ONE TIME DAILY   Semaglutide -Weight Management (WEGOVY ) 2.4 MG/0.75ML SOAJ Inject 2.4 mg into the skin once a week.   Zoster Vaccine Adjuvanted [facility]) injection Inject 0.5 ml IM and Repeat in 2 months     Allergies:   Patient has no known allergies.   Past Medical History:  Diagnosis Date   COPD (chronic obstructive pulmonary disease) (HCC)    Coronary artery disease  GERD (gastroesophageal reflux disease)    Headache    probably weekly (02/24/2018)   Hepatitis 1972   don't remember what kind (02/24/2018)   Hyperlipidemia    Hypertension    Myocardial infarct (HCC) 2004   s/p stent placement    Pneumonia    once (02/24/2018)    Past Surgical History:  Procedure Laterality Date   AUGMENTATION MAMMAPLASTY Bilateral ~ 2008   implants   BREAST BIOPSY Right    negative   CARDIAC CATHETERIZATION N/A 11/28/2014   Procedure: Left Heart Cath and Coronary Angiography;  Surgeon: Debby DELENA Sor, MD;  [facility];  Service: Cardiovascular;  Laterality: N/A;   CORONARY ANGIOPLASTY WITH STENT PLACEMENT  2004   with stent  placement to the right coronary.   Cotton Osteotomy with Bone Graft Right 11/24/2012   @ [facility]   KNEE ARTHROSCOPY Left 2010   Plantar Endoscopic Fasciotomy Right 11/24/2012   @ [facility]     Social History:  The patient   reports that she quit smoking about 26 years ago. Her smoking use included cigarettes. She started smoking about 52 years ago. She has a 39.7 pack-year smoking history. She has never used smokeless tobacco. She reports that she does not currently use alcohol. She reports that she does not use drugs.   Family History:  The patient's family history includes Alzheimer's disease (age of onset: 23) in her mother; Heart attack in her sister; Stroke (age of onset: 35) in her father.    ROS:  Please see the history of present illness. All other systems are reviewed and  Negative to the above problem except as noted.    PHYSICAL EXAM: VS:  BP 130/80   Pulse 68   Ht 5' 3 (1.6 m)   Wt 146 lb 6.4 oz (66.4 kg)   SpO2 96%   BMI 25.93 kg/m   GEN: Obese 68 yo  in no acute distress  HEENT: normal   Thinning hair  Neck: no JVD, carotid bruits Cardiac: RRR; no murmur   Respiratory:  clear to auscultation GI: soft, nontender No hepatomegaly  Ext  No LE edema   EKG:  EKG is ordered today.  SR  68 bpm  First degree AV block  PR 210 msec    Lipid Panel    Component Value Date/Time   CHOL 113 06/30/2022 0836   TRIG 133 06/30/2022 0836   HDL 40 06/30/2022 0836   CHOLHDL 2.8 06/30/2022 0836   CHOLHDL 3.6 06/20/2015 0849   VLDL 24 06/20/2015 0849   LDLCALC 50 06/30/2022 0836   LDLDIRECT 99 08/02/2012 0917      Wt Readings from Last 3 Encounters:  02/25/24 146 lb 6.4 oz (66.4 kg)  09/28/23 166 lb (75.3 kg)  07/27/23 180 lb (81.6 kg)      ASSESSMENT AND PLAN:  1  CAD  No symptoms to sugg angina   Follow   2  HTN  BP is much better controlled   Keep on current regimen  WIll check labs      3  HL  Set up for NMR panel and Lpa  4  Hair loss  Check TSH   ? If secondary to b blocker  5  Metabolics  WIll check A1C   Also Vit D and B12  Pt wants to know any other vitamins to check  Stay active      6  Plastic surgery   Pt is contemplating a face lift  From cardiac standpoint she would be  relatively low risk for major cardiac event with this   OK to proceed    Current medicines are reviewed at length with the patient today.  The patient does not have concerns regarding medicines.  Signed, Vina Gull, MD  02/25/2024 10:21 AM    [facility] [address] [place], [place], [place]  [address] Phone: [phone]; Fax: [phone]

## 2024-02-25 ENCOUNTER — Ambulatory Visit: Attending: Internal Medicine | Admitting: Internal Medicine

## 2024-02-25 ENCOUNTER — Encounter: Payer: Self-pay | Admitting: Internal Medicine

## 2024-02-25 VITALS — BP 130/80 | HR 68 | Ht 63.0 in | Wt 146.4 lb

## 2024-02-25 DIAGNOSIS — I10 Essential (primary) hypertension: Secondary | ICD-10-CM

## 2024-02-25 DIAGNOSIS — I2510 Atherosclerotic heart disease of native coronary artery without angina pectoris: Secondary | ICD-10-CM

## 2024-02-25 DIAGNOSIS — E785 Hyperlipidemia, unspecified: Secondary | ICD-10-CM

## 2024-02-25 MED ORDER — REPATHA SURECLICK 140 MG/ML ~~LOC~~ SOAJ: 140.0000 mg | SUBCUTANEOUS | 11 refills | Status: AC

## 2024-02-25 MED ORDER — EZETIMIBE 10 MG PO TABS: 10.0000 mg | ORAL_TABLET | Freq: Every day | ORAL | 11 refills | Status: DC

## 2024-02-25 MED ORDER — CARVEDILOL 6.25 MG PO TABS: 6.2500 mg | ORAL_TABLET | Freq: Two times a day (BID) | ORAL | 11 refills | Status: AC

## 2024-02-25 MED ORDER — NITROGLYCERIN 0.4 MG SL SUBL: 0.4000 mg | SUBLINGUAL_TABLET | SUBLINGUAL | 6 refills | Status: AC | PRN

## 2024-02-25 MED ORDER — ATORVASTATIN CALCIUM 80 MG PO TABS: 80.0000 mg | ORAL_TABLET | Freq: Every day | ORAL | 11 refills | Status: AC

## 2024-02-25 MED ORDER — IRBESARTAN 300 MG PO TABS: 300.0000 mg | ORAL_TABLET | Freq: Every day | ORAL | 11 refills | Status: AC

## 2024-02-25 MED ORDER — AMLODIPINE BESYLATE 5 MG PO TABS: 5.0000 mg | ORAL_TABLET | Freq: Two times a day (BID) | ORAL | 11 refills | Status: AC

## 2024-02-25 NOTE — Patient Instructions (Signed)
 Medication Instructions:  Your physician recommends that you continue on your current medications as directed. Please refer to the Current Medication list given to you today.  *If you need a refill on your cardiac medications before your next appointment, please call your pharmacy*  Lab Work: TODAY: CMET, Hgb A1c, TSH, NMR, vitamin B12, vitamin D (total), LP(a) If you have labs (blood work) drawn today and your tests are completely normal, you will receive your results only by: MyChart Message (if you have MyChart) OR A paper copy in the mail If you have any lab test that is abnormal or we need to change your treatment, we will call you to review the results.  Follow-Up: At [facility], you and your health needs are our priority.  As part of our continuing mission to provide you with exceptional heart care, our providers are all part of one team.  This team includes your primary Cardiologist (physician) and Advanced Practice Providers or APPs (Physician Assistants and Nurse Practitioners) who all work together to provide you with the care you need, when you need it.  Your next appointment:   10 month(s)  Provider:   Vina Gull, MD  We recommend signing up for the patient portal called MyChart.  Sign up information is provided on this After Visit Summary.  MyChart is used to connect with patients for Virtual Visits (Telemedicine).  Patients are able to view lab/test results, encounter notes, upcoming appointments, etc.  Non-urgent messages can be sent to your provider as well.    To learn more about what you can do with MyChart, go to [email or web address].

## 2024-02-28 ENCOUNTER — Ambulatory Visit: Payer: Self-pay | Admitting: Internal Medicine

## 2024-02-28 DIAGNOSIS — E785 Hyperlipidemia, unspecified: Secondary | ICD-10-CM

## 2024-03-03 LAB — COMPREHENSIVE METABOLIC PANEL WITH GFR
ALT: 19 IU/L (ref 0–32)
AST: 17 IU/L (ref 0–40)
Albumin: 4.2 g/dL (ref 3.9–4.9)
Alkaline Phosphatase: 88 IU/L (ref 49–135)
BUN/Creatinine Ratio: 31 — AB (ref 12–28)
BUN: 17 mg/dL (ref 8–27)
Bilirubin Total: 0.7 mg/dL (ref 0.0–1.2)
CO2: 21 mmol/L (ref 20–29)
Calcium: 9.2 mg/dL (ref 8.7–10.3)
Chloride: 106 mmol/L (ref 96–106)
Creatinine, Ser: 0.54 mg/dL — AB (ref 0.57–1.00)
Globulin, Total: 2.1 g/dL (ref 1.5–4.5)
Glucose: 93 mg/dL (ref 70–99)
Potassium: 4.2 mmol/L (ref 3.5–5.2)
Sodium: 142 mmol/L (ref 134–144)
Total Protein: 6.3 g/dL (ref 6.0–8.5)
eGFR: 100 mL/min/1.73 (ref >59)

## 2024-03-03 LAB — NMR, LIPOPROFILE
Cholesterol, Total: 93 mg/dL — AB (ref 100–199)
HDL Particle Number: 32.7 umol/L (ref >=30.5)
HDL-C: 47 mg/dL (ref >39)
LDL Particle Number: 466 nmol/L (ref <1000)
LDL Size: 20.2 nm — AB (ref >20.5)
LDL-C (NIH Calc): 27 mg/dL (ref 0–99)
LP-IR Score: 45 (ref <=45)
Small LDL Particle Number: 313 nmol/L (ref <=527)
Triglycerides: 104 mg/dL (ref 0–149)

## 2024-03-03 LAB — HEMOGLOBIN A1C
Est. average glucose Bld gHb Est-mCnc: 111 mg/dL
Hgb A1c MFr Bld: 5.5 % (ref 4.8–5.6)

## 2024-03-03 LAB — VITAMIN D, 25-HYDROXY, TOTAL: Vitamin D, 25-Hydroxy, Serum: 91 ng/mL

## 2024-03-03 LAB — TSH: TSH: 2.94 u[IU]/mL (ref 0.450–4.500)

## 2024-03-03 LAB — LIPOPROTEIN A (LPA): Lipoprotein (a): 182.6 nmol/L — AB (ref <75.0)

## 2024-03-03 LAB — VITAMIN B12: Vitamin B-12: >2000 pg/mL — AB (ref 232–1245)

## 2024-03-07 ENCOUNTER — Encounter: Payer: Self-pay | Admitting: Pulmonary Disease

## 2024-03-07 ENCOUNTER — Ambulatory Visit: Admitting: Pulmonary Disease

## 2024-03-07 ENCOUNTER — Other Ambulatory Visit: Payer: Self-pay | Admitting: Pulmonary Disease

## 2024-03-07 VITALS — BP 119/64 | HR 82 | Ht 63.0 in | Wt 146.8 lb

## 2024-03-07 DIAGNOSIS — J431 Panlobular emphysema: Secondary | ICD-10-CM | POA: Diagnosis not present

## 2024-03-07 MED ORDER — ALBUTEROL SULFATE HFA 108 (90 BASE) MCG/ACT IN AERS: 2.0000 | INHALATION_SPRAY | RESPIRATORY_TRACT | 6 refills | Status: DC | PRN

## 2024-03-07 MED ORDER — TRELEGY ELLIPTA 100-62.5-25 MCG/ACT IN AEPB: 1.0000 | INHALATION_SPRAY | Freq: Every day | RESPIRATORY_TRACT | 11 refills | Status: AC

## 2024-03-07 NOTE — Progress Notes (Signed)
 Jamie Pennington    [id number]    [birth year]  Primary Care Physician:Nygaard, Fairy, MD  Referring Physician: Lorrane Fairy, MD [address] [place],  [place] [address]  Chief complaint:   Patient with COPD And for routine follow-up  Discussed the use of AI scribe software for clinical note transcription with the patient, who gave verbal consent to proceed.  History of Present Illness Jamie Pennington is a 68 year old female with emphysema who presents for a follow-up on her lung condition.  She feels 'pretty good' overall, with her breathing and activity level also described as 'pretty good'. However, she notes occasional weakness in her feet.  Her last pulmonary function test (PFT) in 2023 showed an FEV1 of 78%, which is mildly lower than the expected 80%. The total lung capacity was measured as normal, and her oxygen exchange was 57% (normal is 75% and above).  No new concerns or symptoms are reported, and she has no questions or concerns at this time.  Has been functioning relatively well Staying active, exercising regularly Compliant with Trelegy quit smoking in 1999   Outpatient Encounter Medications as of 03/07/2024  Medication Sig   acetaminophen  (TYLENOL ) 500 MG tablet Take 1,000 mg by mouth every 6 (six) hours as needed for headache.   albuterol  (VENTOLIN  HFA) 108 (90 Base) MCG/ACT inhaler Inhale 2 puffs into the lungs as needed (Wheezing).   amLODipine  (NORVASC ) 5 MG tablet Take 1 tablet (5 mg total) by mouth in the morning and at bedtime.   aspirin  81 MG tablet Take 81 mg by mouth daily. Reported on 11/27/2015   atorvastatin  (LIPITOR) 80 MG tablet Take 1 tablet (80 mg total) by mouth daily.   carvedilol  (COREG ) 6.25 MG tablet Take 1 tablet (6.25 mg total) by mouth 2 (two) times daily.   diclofenac  (VOLTAREN ) 75 MG EC tablet Take 1 tablet (75 mg total) by mouth 2 (two) times daily.   Evolocumab  (REPATHA  SURECLICK) 140 MG/ML SOAJ Inject 140 mg into the skin  every 14 (fourteen) days.   famotidine  (PEPCID ) 40 MG tablet TAKE 1 TABLET(40 MG) BY MOUTH DAILY   irbesartan  (AVAPRO ) 300 MG tablet Take 1 tablet (300 mg total) by mouth daily.   loratadine  (CLARITIN ) 10 MG tablet Take 1 tablet (10 mg total) by mouth daily.   montelukast  (SINGULAIR ) 10 MG tablet Take 1 tablet (10 mg total) by mouth at bedtime.   Multiple Vitamin (MULTIVITAMIN) tablet Take 1 tablet by mouth daily.   nitroGLYCERIN  (NITROSTAT ) 0.4 MG SL tablet Place 1 tablet (0.4 mg total) under the tongue every 5 (five) minutes as needed for chest pain (MAX 3 TABLETS).   omeprazole  (PRILOSEC) 40 MG capsule TAKE ONE CAPSULE BY MOUTH ONE TIME DAILY   Semaglutide -Weight Management (WEGOVY ) 2.4 MG/0.75ML SOAJ Inject 2.4 mg into the skin once a week.   Zoster Vaccine Adjuvanted [facility]) injection Inject 0.5 ml IM and Repeat in 2 months   [DISCONTINUED] Fluticasone -Umeclidin-Vilant (TRELEGY ELLIPTA ) 100-62.5-25 MCG/ACT AEPB INHALE ONE PUFF BY MOUTH ONE TIME DAILY   Fluticasone -Umeclidin-Vilant (TRELEGY ELLIPTA ) 100-62.5-25 MCG/ACT AEPB Inhale 1 puff into the lungs daily.   No facility-administered encounter medications on file as of 03/07/2024.    Allergies as of 03/07/2024   (No Known Allergies)    Past Medical History:  Diagnosis Date   COPD (chronic obstructive pulmonary disease) (HCC)    Coronary artery disease    GERD (gastroesophageal reflux disease)    Headache    probably  weekly (02/24/2018)   Hepatitis 1972   don't remember what kind (02/24/2018)   Hyperlipidemia    Hypertension    Myocardial infarct [facility]) 2004   s/p stent placement    Pneumonia    once (02/24/2018)    Past Surgical History:  Procedure Laterality Date   AUGMENTATION MAMMAPLASTY Bilateral ~ 2008   implants   BREAST BIOPSY Right    negative   CARDIAC CATHETERIZATION N/A 11/28/2014   Procedure: Left Heart Cath and Coronary Angiography;  Surgeon: Debby DELENA Sor, MD;  Location: [facility] CV LAB;   Service: Cardiovascular;  Laterality: N/A;   CORONARY ANGIOPLASTY WITH STENT PLACEMENT  2004   with stent  placement to the right coronary.   Cotton Osteotomy with Bone Graft Right 11/24/2012   @ [facility]   KNEE ARTHROSCOPY Left 2010   Plantar Endoscopic Fasciotomy Right 11/24/2012   @ [facility]    Family History  Problem Relation Age of Onset   Stroke Father 19       died   Alzheimer's disease Mother 39       died   Heart attack Sister        and bypass surgery    Social History   Socioeconomic History   Marital status: Divorced    Spouse name: Not on file   Number of children: Not on file   Years of education: Not on file   Highest education level: Not on file  Occupational History   Not on file  Tobacco Use   Smoking status: Former    Current packs/day: 0.00    Average packs/day: 1.5 packs/day for 26.5 years (39.7 ttl pk-yrs)    Types: Cigarettes    Start date: 07/24/1971    Quit date: 01/18/1998    Years since quitting: 26.1   Smokeless tobacco: Never  Vaping Use   Vaping status: Never Used  Substance and Sexual Activity   Alcohol use: Not Currently    Comment: 02/24/2018 just in my teens   Drug use: Never   Sexual activity: Not Currently  Other Topics Concern   Not on file  Social History Narrative   ** Merged History Encounter **       Social Drivers of Corporate Investment Banker Strain: Not on file  Food Insecurity: Not on file  Transportation Needs: Not on file  Physical Activity: Not on file  Stress: Not on file  Social Connections: Not on file  Intimate Partner Violence: Not on file    Review of Systems  Constitutional:  Negative for fatigue.  Respiratory:  Negative for cough and shortness of breath.     Vitals:   03/07/24 0853  BP: 119/64  Pulse: 82  SpO2: 94%     Physical Exam Constitutional:      Appearance: Normal appearance.  HENT:     Head: Normocephalic.     Mouth/Throat:     Mouth: Mucous membranes are moist.  Eyes:      General: No scleral icterus. Cardiovascular:     Rate and Rhythm: Normal rate and regular rhythm.     Heart sounds: No murmur heard.    No friction rub.  Pulmonary:     Effort: No respiratory distress.     Breath sounds: No stridor. No wheezing or rhonchi.  Musculoskeletal:     Cervical back: No rigidity or tenderness.  Neurological:     Mental Status: She is alert.  Psychiatric:        Mood and  Affect: Mood normal.    Data Reviewed: CT October 2024 reviewed showing evidence of extensive emphysema  Assessment and Plan Assessment & Plan Emphysema Chronic emphysema with structural lung loss, as indicated by CT scan findings of jet black areas. The condition is slowly progressive, with increased risk for complications from respiratory infections like influenza or pneumonia. PFTs from 2023 show FEV1 at 78%, mildly lower than expected, with normal total lung capacity. Oxygen exchange is reduced at 57%, likely due to emphysema, but she remains functional and not activity-limited. - Continue current inhaler regimen to maintain airway patency. - Encourage physical activity to maintain lung function. - Schedule follow-up in one year with repeat PFTs on the same day or the day before the visit. - Advise to contact the office if symptoms worsen or concerns arise before the next scheduled visit.     Orders Placed This Encounter  Procedures   Pulmonary Function Test    Standing Status:   Future    Expiration Date:   03/07/2025    Full PFT:   Yes    Give us  a call with any significant concerns  Follow-up a year from now  Jennet Epley MD [facility] 03/07/2024, 9:25 AM  CC: Lorrane Pac, MD

## 2024-03-07 NOTE — Patient Instructions (Addendum)
 I will see you back in about a year  Your CT scan is stable with evidence of emphysema  Your breathing study is stable - We will repeat 1 before the next visit or about the same time  Call us  with significant concerns  Use albuterol  as needed  Trelegy on a daily basis

## 2024-03-15 ENCOUNTER — Telehealth: Payer: Self-pay | Admitting: *Deleted

## 2024-03-15 DIAGNOSIS — J441 Chronic obstructive pulmonary disease with (acute) exacerbation: Secondary | ICD-10-CM

## 2024-03-15 DIAGNOSIS — J431 Panlobular emphysema: Secondary | ICD-10-CM

## 2024-03-15 MED ORDER — ALBUTEROL SULFATE HFA 108 (90 BASE) MCG/ACT IN AERS: 2.0000 | INHALATION_SPRAY | Freq: Four times a day (QID) | RESPIRATORY_TRACT | 6 refills | Status: AC | PRN

## 2024-03-15 NOTE — Telephone Encounter (Signed)
 Rx resent corrected directions

## 2024-03-16 ENCOUNTER — Telehealth: Payer: Self-pay | Admitting: Family Medicine

## 2024-03-16 ENCOUNTER — Ambulatory Visit (INDEPENDENT_AMBULATORY_CARE_PROVIDER_SITE_OTHER)

## 2024-03-16 VITALS — BP 124/70 | HR 78 | Ht 63.0 in | Wt 143.8 lb

## 2024-03-16 DIAGNOSIS — Z0000 Encounter for general adult medical examination without abnormal findings: Secondary | ICD-10-CM

## 2024-03-16 DIAGNOSIS — K5904 Chronic idiopathic constipation: Secondary | ICD-10-CM

## 2024-03-16 DIAGNOSIS — K219 Gastro-esophageal reflux disease without esophagitis: Secondary | ICD-10-CM

## 2024-03-16 NOTE — Assessment & Plan Note (Addendum)
 Well-controlled, had extensive discussion regarding risks of chronic PPI use and patient is aware and does not desire to reattempt taper or discontinuation of omeprazole  at this time. -Continue Pepcid  nightly and omeprazole  daily - DEXA scans followed with [facility].

## 2024-03-16 NOTE — Patient Instructions (Addendum)
 Thank you for visiting the clinic today, it was good to see you!  Please always bring your medication bottles  In today's visit we discussed:  Great work on staying up-to-date for all of your healthcare needs, this is very rare and it makes you an exceptional patient!  Constipation: Recommend eating at least half of your meals consisting of fruits and vegetables, primarily vegetables.  Also recommend trying to find a water bottle that you like and enjoy drinking out of to achieve adequate hydration.  Use your stool softener as frequently as needed to achieve soft bowel movement without straining daily.  Please follow-up in 1 year  For any questions, please call the office at [phone] or send me a message in MyChart. Have a great day!  -[name], MD  [facility] Resident, PGY-1
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
## 2024-03-16 NOTE — Telephone Encounter (Signed)
 Error

## 2024-03-23 ENCOUNTER — Other Ambulatory Visit: Payer: Self-pay

## 2024-03-23 MED ORDER — FAMOTIDINE 40 MG PO TABS: ORAL_TABLET | ORAL | 3 refills | Status: AC

## 2024-03-23 NOTE — Telephone Encounter (Signed)
 Chart reviewed  -[name], MD

## 2024-04-19 ENCOUNTER — Other Ambulatory Visit: Payer: Self-pay | Admitting: Pulmonary Disease

## 2024-04-19 NOTE — Telephone Encounter (Signed)
 Pt is requesting refill of singulair , not mentioned in LOV notes to continue or d/c. Please advise, thank you!

## 2024-04-25 NOTE — Telephone Encounter (Signed)
 Copied from CRM #[id number]. Topic: Clinical - Medication Question >> Apr 25, 2024 10:37 AM Lavanda D wrote: Reason for CRM: Patient is calling to see if there are any updates regarding her refill request for the montelukast  (SINGULAIR ) 10 MG tablet. Please call to advise.   Awaiting Dr. Neda response.

## 2024-05-24 ENCOUNTER — Other Ambulatory Visit: Payer: Self-pay

## 2024-05-25 ENCOUNTER — Encounter: Payer: Self-pay | Admitting: Pharmacist

## 2024-05-25 ENCOUNTER — Telehealth: Payer: Self-pay | Admitting: Pharmacy Technician

## 2024-05-25 ENCOUNTER — Other Ambulatory Visit (HOSPITAL_COMMUNITY): Payer: Self-pay

## 2024-05-25 NOTE — Telephone Encounter (Signed)
 Pharmacy Patient Advocate Encounter   Received notification from Patient Advice Request messages that prior authorization for wegovy  2.5mg  is required/requested.   Insurance verification completed.   The patient is insured through [facility].   Per test claim: PA required; PA submitted to above mentioned insurance via Latent Key/confirmation #/EOC [id number] Status is pending

## 2024-05-25 NOTE — Telephone Encounter (Signed)
 Pharmacy Patient Advocate Encounter  Received notification from [employer] that Prior Authorization for wegovy  has been APPROVED from 05/25/24 to 05/25/25   PA #/Case ID/Reference #: [id number]

## 2024-05-25 NOTE — Telephone Encounter (Signed)
 Chart reviewed, previously discussed chronic PPI use w/ pt.  -[name], MD

## 2024-06-15 ENCOUNTER — Other Ambulatory Visit: Payer: Self-pay | Admitting: Internal Medicine
